# Patient Record
Sex: Female | Born: 1940 | Race: Black or African American | Hispanic: No
Health system: Midwestern US, Community
[De-identification: ages and names within clinical notes are randomized; demographics above are authoritative.]

## PROBLEM LIST (undated history)

## (undated) DIAGNOSIS — M25552 Pain in left hip: Secondary | ICD-10-CM

## (undated) DIAGNOSIS — R1011 Right upper quadrant pain: Secondary | ICD-10-CM

## (undated) DIAGNOSIS — M1612 Unilateral primary osteoarthritis, left hip: Secondary | ICD-10-CM

## (undated) DIAGNOSIS — R9431 Abnormal electrocardiogram [ECG] [EKG]: Secondary | ICD-10-CM

## (undated) DIAGNOSIS — R1010 Upper abdominal pain, unspecified: Secondary | ICD-10-CM

## (undated) DIAGNOSIS — I4891 Unspecified atrial fibrillation: Principal | ICD-10-CM

---

## 2013-03-13 NOTE — Patient Instructions (Signed)
Learning About Colonoscopy  What is a colonoscopy?  A colonoscopy is a test (also called a procedure) that lets a doctor look inside your large intestine. The doctor uses a thin, lighted tube called a colonoscope. The doctor uses it to look for small growths called polyps, colon or rectal cancer (colorectal cancer), or other problems like bleeding.  During the procedure, the doctor can take samples of tissue. The samples can then be checked for cancer or other conditions. The doctor can also take out polyps.  How is colonoscopy done?  This procedure is done in a doctor's office or a clinic or hospital. You will get medicine to help you relax and not feel pain. Some people find that they do not remember having the test because of the medicine.  The doctor gently moves the colonoscope, or scope, through the colon. The scope is also a small video camera. It lets the doctor see the colon and take pictures.  A colonoscopy usually takes 30 to 45 minutes. It may take longer if the doctor has to remove polyps.  How do you prepare for the procedure?  You need to clean out your colon before the procedure so the doctor can see all of your colon. You may start the cleaning process a day or two before the test. This depends on which "colon prep" your doctor recommends.  To clean your colon, you stop eating solid foods and drink only clear liquids. You can have water, tea, coffee, clear juices, clear broths, flavored ice pops, and gelatin (such as Jell-O). Do not drink anything red or purple, such as grape juice or fruit punch. And do not eat red or purple foods, such as grape ice pops or cherry gelatin.  The day or night before the procedure, you drink a large amount of a special liquid. This causes loose, frequent stools. You will go to the bathroom a lot. It is very important to drink all of the colon prep liquid. If you have problems drinking the liquid, call your doctor.  For many people, the prep is worse than the test.  It may be uncomfortable, and you may feel hungry on the clear liquid diet. Some people do not go to work or do their usual activities on the day of the prep.  Arrange to have someone take you home after the test.  What can you expect after a colonoscopy?  The nurses will watch you for 1 to 2 hours until the medicines wear off. Then you can go home. You will need a ride. Your doctor will tell you when you can eat and do your usual activities.  Your doctor will talk to you about when you will need your next colonoscopy. The results of your test and your risk for colorectal cancer will help your doctor decide how often you need to be checked.  Follow-up care is a key part of your treatment and safety. Be sure to make and go to all appointments, and call your doctor if you are having problems. It's also a good idea to know your test results and keep a list of the medicines you take.   Where can you learn more?   Go to http://www.healthwise.net/[facility]  Enter Z368 in the search box to learn more about "Learning About Colonoscopy."   ?? 2006-2014 Healthwise, Incorporated. Care instructions adapted under license by [employer] (which disclaims liability or warranty for this information). This care instruction is for use with your licensed healthcare professional. If you   have questions about a medical condition or this instruction, always ask your healthcare professional. Healthwise, Incorporated disclaims any warranty or liability for your use of this information.  Content Version: 10.0.270728; Last Revised: January 31, 2012

## 2013-03-13 NOTE — Progress Notes (Signed)
HISTORY OF PRESENT ILLNESS  Michele Mejia is a 72 y.o. female.  HPI  Recent hosp with rectal bleeding, [name] witness, recent hip replacement    Review of Systems   Constitutional: Negative for weight loss.   HENT: Negative for congestion and sore throat.    Eyes: Negative for double vision.   Respiratory: Negative for hemoptysis and shortness of breath.    Cardiovascular: Negative for chest pain and palpitations.   Gastrointestinal: Positive for abdominal pain. Negative for heartburn and diarrhea.   Genitourinary: Negative for hematuria.   Neurological: Negative for focal weakness.   Endo/Heme/Allergies: Does not bruise/bleed easily.   Psychiatric/Behavioral: Negative for memory loss.       Physical Exam   Constitutional: She appears well-developed and well-nourished.   obese   HENT:   Head: Normocephalic and atraumatic.   Eyes: EOM are normal.   Neck: Neck supple. No tracheal deviation present. No thyromegaly present.   Cardiovascular: Regular rhythm and normal heart sounds.    Pulmonary/Chest: Breath sounds normal. No respiratory distress.   Abdominal: Bowel sounds are normal. She exhibits no distension. There is no guarding.   Lymphadenopathy:     She has no cervical adenopathy.   Neurological: She is alert.   Skin: Skin is warm and dry. No erythema.   Psychiatric: Her behavior is normal.     Past Medical History   Diagnosis Date   ??? Hypertension    ??? Anemia    ??? Rectal bleed    ??? Gout    ??? GERD (gastroesophageal reflux disease)    ??? Asthma      Past Surgical History   Procedure Laterality Date   ??? Hx hysterectomy     ??? Hx breast reduction     ??? Hx hip replacement Right    ??? Hx breast biopsy Bilateral      neg        Current Outpatient Prescriptions   Medication Sig Dispense Refill   ??? DILTIAZEM HCL (CARTIA XT PO) Take  by mouth.       ??? LOSARTAN PO Take  by mouth.       ??? potassium chloride SA (KLOR-CON M15) 15 mEq tablet Take  by mouth two (2) times a day.       ??? FLUTICASONE/SALMETEROL (ADVAIR DISKUS IN)  Take  by inhalation.       ??? ATENOLOL PO Take  by mouth.       ??? RANITIDINE HCL (ZANTAC PO) Take  by mouth.       ??? POLYETHYLENE GLYCOL by Does Not Apply route.       ??? CLOPIDOGREL BISULFATE (PLAVIX PO) Take  by mouth.       ??? aspirin (ASPIRIN) 325 mg tablet Take 325 mg by mouth daily.       ??? ALLOPURINOL PO Take  by mouth.       ??? PRAVASTATIN SODIUM (PRAVASTATIN PO) Take  by mouth.       ??? ESCITALOPRAM OXALATE (LEXAPRO PO) Take  by mouth.       ??? HYDROMORPHONE HCL (DILAUDID) by Does Not Apply route.         Allergies   Allergen Reactions   ??? Nuts (Tree Nut) Swelling   ??? Peanut Swelling   ??? Simvastatin Unable to Obtain     History     Social History   ??? Marital Status: WIDOWED     Spouse Name: N/A     Number of Children:  N/A   ??? Years of Education: N/A     Occupational History   ??? Not on file.     Social History Main Topics   ??? Smoking status: Never Smoker    ??? Smokeless tobacco: Not on file   ??? Alcohol Use: No   ??? Drug Use: Not on file   ??? Sexually Active: Not on file     Other Topics Concern   ??? Not on file     Social History Narrative   ??? No narrative on file     No family history on file.    ASSESSMENT and PLAN  egd colonoscopy antibiotics

## 2013-04-06 NOTE — Progress Notes (Signed)
Medications reviewed/approved by Dr. Hughes.

## 2013-04-06 NOTE — Progress Notes (Signed)
HISTORY OF PRESENT ILLNESS  Michele Mejia is a 72 y.o. female.    HPI  Referred for abnormal ECG.  Due to have endoscopy and colonoscopy.  Has some DOE.  Currently no chest pain, dyspnea at rest, orthopnea, PND, palpitations, edema, syncope, or claudication.    Review of Systems   Constitutional: Positive for malaise/fatigue. Negative for weight loss.   Eyes: Negative for double vision.   Respiratory: Negative for cough, sputum production and shortness of breath.    Gastrointestinal: Positive for abdominal pain. Negative for heartburn, nausea, blood in stool and melena.   Genitourinary: Negative for dysuria.   Neurological: Negative for loss of consciousness and headaches.   All other systems reviewed and are negative.      Past Medical History   Diagnosis Date   ??? Anemia    ??? Rectal bleed    ??? Gout    ??? GERD (gastroesophageal reflux disease)    ??? Asthma    ??? HCVD (hypertensive cardiovascular disease)    ??? Obesity    ??? OSA (obstructive sleep apnea)    ??? COPD (chronic obstructive pulmonary disease)    ??? Depression    ??? TIA (transient ischemic attack)      Past Surgical History   Procedure Laterality Date   ??? Hx hysterectomy     ??? Hx breast reduction     ??? Hx hip replacement Right    ??? Hx breast biopsy Bilateral      neg     Current Outpatient Prescriptions   Medication Sig   ??? losartan-hydrochlorothiazide (HYZAAR) 100-12.5 mg per tablet Take 1 Tab by mouth daily.   ??? potassium chloride (KLOR-CON M20) 20 mEq tablet Take  by mouth daily.   ??? albuterol (VENTOLIN HFA) 90 mcg/actuation inhaler Take 2 Puffs by inhalation two (2) times a day.   ??? diclofenac EC (VOLTAREN) 75 mg EC tablet Take  by mouth two (2) times a day.   ??? DILTIAZEM HCL (CARTIA XT PO) Take 240 mg by mouth.   ??? FLUTICASONE/SALMETEROL (ADVAIR DISKUS IN) Take  by inhalation two (2) times a day.   ??? ATENOLOL PO Take 50 mg by mouth.   ??? RANITIDINE HCL (ZANTAC PO) Take 150 mg by mouth.   ??? POLYETHYLENE GLYCOL Take  by mouth.   ??? ALLOPURINOL PO Take 100  mg by mouth two (2) times a day.   ??? PRAVASTATIN SODIUM (PRAVASTATIN PO) Take 40 mg by mouth.   ??? ESCITALOPRAM OXALATE (LEXAPRO PO) Take 20 mg by mouth.     No current facility-administered medications for this visit.     History   Substance Use Topics   ??? Smoking status: Never Smoker    ??? Smokeless tobacco: Not on file   ??? Alcohol Use: No     Family History   Problem Relation Age of Onset   ??? Hypertension Mother    ??? Stroke Mother    ??? Coronary Artery Disease Sister    ??? Hypertension Sister    ??? Diabetes Sister    ??? Hypertension Brother    ??? Diabetes Brother    ??? Coronary Artery Disease Brother    ??? Hypertension Brother    ??? Diabetes Brother    ??? Diabetes Sister      BP 130/88   Pulse 78   Resp 20   Ht 5\' 7"  (1.702 m)   Wt 258 lb (117.028 kg)   BMI 40.4 kg/m2   SpO2 98%  Physical Exam   Constitutional: She appears well-developed.   HENT:   Head: Normocephalic.   Eyes: EOM are normal.   Neck: No JVD present. Carotid bruit is not present. No thyromegaly present.   Cardiovascular: Regular rhythm, S1 normal, S2 normal and intact distal pulses.  PMI is not displaced.  Exam reveals no gallop.    No murmur heard.  Pulmonary/Chest: Breath sounds normal. She has no wheezes. She has no rales.   Abdominal: She exhibits no abdominal bruit and no pulsatile midline mass. There is no splenomegaly or hepatomegaly. There is no tenderness.   Lymphadenopathy:     She has no cervical adenopathy.   Neurological: She has normal reflexes.   No edema.    ECG:  NSR, PRWP, NST    TC 172, LDL 106, HDL 44, TG's 111    ASSESSMENT and PLAN    ICD-9-CM   1. Abnormal ECG 794.31   2. HCVD (hypertensive cardiovascular disease) 402.90   3. OSA (obstructive sleep apnea) 327.23   4. Diabetes 250.00       ECG questioned old anterior MI but the findings are really non-specific.  No specific cardiac symptoms.  Hemodynamically compensated.  She had a stress test and a cath in the past.  She does not think there were any blockages.  Will try to get  records.  Favor screening with a perfusion study now.    Phineas Semen, MD, [facility]

## 2013-04-08 NOTE — Progress Notes (Addendum)
Pt is scheduled for a lexiscan stress test at [facility] on 04/15/13, 9:45am arrival.  Pt given order and instructions verbally/written.  Pt verbalized understanding.    Per [employer]- Under clinical review.  Clinicals faxed to [phone].  Ref# [id number]    AUTH# [id number]    Office note, ekg, order faxed to radiology.

## 2013-04-15 NOTE — Telephone Encounter (Signed)
Pt is returning your call.

## 2013-04-16 NOTE — Telephone Encounter (Signed)
Called about results of nuclear stress test.  Relatively minimal suggestion of an ischemic area.  With history of normal cath 4 years ago there is a significant risk this is a false positive finding.  Option is cardiac cath although that is at increased risk with history of prior TIA.  Discussed at length.  She is going to think about options for a few days and let us know.  This small area in any event would not represent a significant risk for endoscopy.    Phineas Semen, MD

## 2013-04-21 NOTE — Telephone Encounter (Signed)
Noted,  dh

## 2013-04-21 NOTE — Telephone Encounter (Signed)
Please give pt a call

## 2013-04-21 NOTE — Telephone Encounter (Signed)
Pt is calling can you please give her a call  [phone]

## 2013-04-21 NOTE — Telephone Encounter (Signed)
Returned pts call. Verified patient with two patient identifiers.    Pt states that she has been giving the cardiac catheterization thought.  She wishes to hold off on the procedure now.  Dr. Kizzie Bane will be informed.      Pt is to follow up in Oct 2014.

## 2013-04-22 NOTE — Telephone Encounter (Signed)
Returned pts call. Verified patient with two patient identifiers.    Pt states that when she was in the hospital last time her atenolol was cut back because of low heart rate and blood pressure.  Pt wanting to know if she should still be on the medication.  Informed pt that based on her last visit her BP/HR was in good range.  Pt denies any symptoms of low hr/bp.  Informed pt that she was suffering from rectal bleeding when she was in the hospital last which could contribute to the low bp.    Pt is not sure what her BP/HR is currently and does not have a device to check.      Pt has an upcoming appt with Dr. Ace Gins and she will call me with her BP/HR readings then.

## 2013-05-07 ENCOUNTER — Encounter

## 2013-06-16 NOTE — Progress Notes (Signed)
Angiodysplasia ascending colon  Chronic gastritis

## 2013-06-16 NOTE — Patient Instructions (Signed)
Learning About Diverticulosis and Diverticulitis  What are diverticulosis and diverticulitis?  In diverticulosis and diverticulitis, pouches called diverticula form in the wall of the large intestine, or colon.  ?? In diverticulosis, the pouches do not cause any pain or other symptoms.  ?? In diverticulitis, the pouches get inflamed or infected and cause symptoms.  Doctors aren't sure what causes these pouches in the colon. But they think that a low-fiber diet may play a role. Without fiber to add bulk to the stool, the colon has to work harder than normal to push the stool forward. The pressure from this may cause pouches to form in weak spots along the colon.  Some people with diverticulosis get diverticulitis. But experts don't know why this happens.  What are the symptoms?  ?? In diverticulosis, most people don't have symptoms. But pouches sometimes bleed.  ?? In diverticulitis, symptoms may last from a few hours to a week or more. They include:  ?? Belly pain. This is usually in the lower left side. It is sometimes worse when you move. This is the most common symptom.  ?? Fever and chills.  ?? Bloating and gas.  ?? Diarrhea or constipation.  ?? Nausea and sometimes vomiting.  ?? Not feeling like eating.  How can you prevent these problems?  You may be able to lower your chance of getting diverticulitis. You can do this by taking steps to prevent constipation.  ?? Eat fruits, vegetables, beans, and whole grains every day. These foods are high in fiber.  ?? Drink plenty of fluids (enough so that your urine is light yellow or clear like water). If you have kidney, heart, or liver disease and have to limit fluids, talk with your doctor before you increase the amount of fluids you drink.  ?? Get at least 30 minutes of exercise on most days of the week. Walking is a good choice. You also may want to do other activities, such as running, swimming, cycling, or playing tennis or team sports.  ?? Take a fiber supplement, such as  Citrucel or Metamucil, every day.  ?? Schedule time each day for a bowel movement. Having a daily routine may help. Take your time and do not strain when having a bowel movement.    How are these problems treated?  ?? The best way to treat diverticulosis is to avoid constipation. (See the tips above.)  ?? Treatment for diverticulitis includes antibiotics and often a change in your diet. You may need only liquids at first. Your doctor may suggest pain medicines for pain or belly cramps. In some cases, surgery may be needed.  Follow-up care is a key part of your treatment and safety. Be sure to make and go to all appointments, and call your doctor if you are having problems. It's also a good idea to know your test results and keep a list of the medicines you take.   Where can you learn more?   Go to [email or web address]  Enter [phone] in the search box to learn more about "Learning About Diverticulosis and Diverticulitis."   ?? 2006-2014 Healthwise, Incorporated. Care instructions adapted under license by [employer] (which disclaims liability or warranty for this information). This care instruction is for use with your licensed healthcare professional. If you have questions about a medical condition or this instruction, always ask your healthcare professional. Healthwise, Incorporated disclaims any warranty or liability for your use of this information.  Content Version: 10.0.270728; Last Revised: January 31, 2012

## 2013-10-07 NOTE — Progress Notes (Signed)
HISTORY OF PRESENT ILLNESS  Michele Mejia is a 72 y.o. female.    HPI  Some chronic DOE.  Currently no chest pain, dyspnea at rest, orthopnea, PND, palpitations, edema, syncope, or claudication.    Review of Systems   Respiratory: Negative for cough, sputum production and shortness of breath.    Gastrointestinal: Negative for heartburn, nausea, blood in stool and melena.   Genitourinary: Negative for dysuria.     Past Medical History   Diagnosis Date   ??? Anemia    ??? Rectal bleed    ??? Gout    ??? GERD (gastroesophageal reflux disease)    ??? Asthma    ??? HCVD (hypertensive cardiovascular disease)    ??? Obesity    ??? OSA (obstructive sleep apnea)    ??? COPD (chronic obstructive pulmonary disease)    ??? Depression    ??? TIA (transient ischemic attack)      Past Surgical History   Procedure Laterality Date   ??? Hx hysterectomy     ??? Hx breast reduction     ??? Hx hip replacement Right    ??? Hx breast biopsy Bilateral      neg   ??? Hx heart catheterization  09/2009     EF 50%, Normal Coronaries   ??? Hx colonoscopy  6.2014   ??? Hx endoscopy  6.2014     chronic gastritis     Current Outpatient Prescriptions   Medication Sig   ??? clopidogrel (PLAVIX) 75 mg tablet Take  by mouth daily.   ??? aspirin (ASPIRIN) 325 mg tablet Take 325 mg by mouth daily.   ??? losartan-hydrochlorothiazide (HYZAAR) 100-12.5 mg per tablet Take 1 Tab by mouth daily.   ??? potassium chloride (KLOR-CON M20) 20 mEq tablet Take  by mouth daily.   ??? albuterol (VENTOLIN HFA) 90 mcg/actuation inhaler Take 2 Puffs by inhalation two (2) times a day.   ??? diclofenac EC (VOLTAREN) 75 mg EC tablet Take  by mouth two (2) times a day.   ??? DILTIAZEM HCL (CARTIA XT PO) Take 240 mg by mouth.   ??? FLUTICASONE/SALMETEROL (ADVAIR DISKUS IN) Take  by inhalation two (2) times a day.   ??? ATENOLOL PO Take 50 mg by mouth.   ??? RANITIDINE HCL (ZANTAC PO) Take 150 mg by mouth.   ??? ALLOPURINOL PO Take 100 mg by mouth two (2) times a day.   ??? PRAVASTATIN SODIUM (PRAVASTATIN PO) Take 40 mg by  mouth.   ??? ESCITALOPRAM OXALATE (LEXAPRO PO) Take 20 mg by mouth.     No current facility-administered medications for this visit.     BP 126/96   Pulse 70   Resp 20   Ht 5\' 7"  (1.702 m)   Wt 261 lb (118.389 kg)   BMI 40.87 kg/m2   SpO2 98%    Physical Exam   Neck: No JVD present. Carotid bruit is not present.   Cardiovascular: Regular rhythm, S1 normal, S2 normal and intact distal pulses.  PMI is not displaced.  Exam reveals no gallop.    No murmur heard.  Pulmonary/Chest: Breath sounds normal. She has no wheezes. She has no rales.   No edema.    CARDIAC TESTING    Dipyridamole MPI 2009: partially reversible anterior defect, EF 46%    Lexiscan MPI 03/2013:  Partially reversible inferior defect, EF 40%    ASSESSMENT and PLAN    ICD-9-CM   1. HCVD (hypertensive cardiovascular disease), without heart failure 402.90  2. COPD (chronic obstructive pulmonary disease) 496   3. OSA (obstructive sleep apnea) 327.23   4. Diabetes 250.00       No specific cardiac symptoms.   Hemodynamically compensated.  Same Rx.    Follow-up Disposition:  Return in about 6 months (around 04/07/2014).    Phineas Semen, MD, [facility]

## 2013-10-07 NOTE — Progress Notes (Signed)
Medications reviewed/approved by Dr. Hughes.

## 2014-08-05 LAB — AMB EXT CREATININE: Creatinine, External: 1.11

## 2014-08-05 LAB — AMB EXT LDL-C: LDL-C, External: 99

## 2014-08-18 NOTE — Progress Notes (Signed)
Michele Mejia is a 73 y.o. female is here for routine f/u.  The patient denies chest pain/ shortness of breath, orthopnea, PND, LE edema, palpitations, syncope, presyncope or fatigue.       Patient Active Problem List    Diagnosis Date Noted   ??? GERD (gastroesophageal reflux disease) 08/18/2014   ??? Obesity 08/18/2014   ??? Dyslipidemia 08/18/2014   ??? Anemia 08/18/2014   ??? TIA (transient ischemic attack) 08/18/2014   ??? 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 HCVD (hypertensive cardiovascular disease)    ??? OSA (obstructive sleep apnea)    ??? COPD (chronic obstructive pulmonary disease) (HCC)    ??? Diabetes (HCC) 03/13/2013      [name], MD  Past Medical History   Diagnosis Date   ??? Anemia    ??? Rectal bleed    ??? Gout    ??? GERD (gastroesophageal reflux disease)    ??? Asthma    ??? HCVD (hypertensive cardiovascular disease)    ??? Obesity    ??? OSA (obstructive sleep apnea)    ??? COPD (chronic obstructive pulmonary disease) (HCC)    ??? Depression    ??? TIA (transient ischemic attack)       Past Surgical History   Procedure Laterality Date   ??? Hx hysterectomy     ??? Hx breast reduction     ??? Hx hip replacement Right    ??? Hx breast biopsy Bilateral      neg   ??? Hx heart catheterization  09/2009     EF 50%, Normal Coronaries   ??? Hx colonoscopy  6.2014   ??? Hx endoscopy  6.2014     chronic gastritis     Allergies   Allergen Reactions   ??? Nuts [Tree Nut] Swelling   ??? Peanut Swelling   ??? Simvastatin Unable to Obtain      Family History   Problem Relation Age of Onset   ??? Hypertension Mother    ??? Stroke Mother    ??? Coronary Artery Disease Sister    ??? Hypertension Sister    ??? Diabetes Sister    ??? Hypertension Brother    ??? Diabetes Brother    ??? Coronary Artery Disease Brother    ??? Hypertension Brother    ??? Diabetes Brother    ??? Diabetes Sister       History     Social History   ??? Marital Status: WIDOWED     Spouse Name: N/A     Number of Children: N/A   ??? Years of Education: N/A     Occupational History    ??? Not on file.     Social History Main Topics   ??? Smoking status: Never Smoker    ??? Smokeless tobacco: Not on file   ??? Alcohol Use: No   ??? Drug Use: Not on file   ??? Sexual Activity: Not on file     Other Topics Concern   ??? Not on file     Social History Narrative   ??? No narrative on file      Current Outpatient Prescriptions   Medication Sig   ??? clopidogrel (PLAVIX) 75 mg tablet Take  by mouth daily.   ??? aspirin (ASPIRIN) 325 mg tablet Take 325 mg by mouth daily.   ??? losartan-hydrochlorothiazide (HYZAAR) 100-12.5 mg per tablet Take 1 Tab by mouth daily.   ??? potassium chloride (  KLOR-CON M20) 20 mEq tablet Take  by mouth daily.   ??? albuterol (VENTOLIN HFA) 90 mcg/actuation inhaler Take 2 Puffs by inhalation two (2) times a day.   ??? diclofenac EC (VOLTAREN) 75 mg EC tablet Take  by mouth two (2) times a day.   ??? DILTIAZEM HCL (CARTIA XT PO) Take 240 mg by mouth.   ??? FLUTICASONE/SALMETEROL (ADVAIR DISKUS IN) Take  by inhalation two (2) times a day.   ??? ATENOLOL PO Take 50 mg by mouth.   ??? RANITIDINE HCL (ZANTAC PO) Take 150 mg by mouth.   ??? ALLOPURINOL PO Take 100 mg by mouth two (2) times a day.   ??? PRAVASTATIN SODIUM (PRAVASTATIN PO) Take 40 mg by mouth.   ??? ESCITALOPRAM OXALATE (LEXAPRO PO) Take 20 mg by mouth.     No current facility-administered medications for this visit.         Review of Symptoms:    CONST  No weight change. No fever, chills, sweats    ENT No visual changes, URI sx, sore throat    CV  See HPI   RESP  No cough, or sputum, wheezing. Also see HPI   GI  No abdominal pain or change in bowel habits.  No heartburn or dysphagia.   No melena or rectal bleeding.    GU  No dysuria, urgency, frequency, hematuria   MSKEL  No joint pain, swelling.   No muscle pain.    SKIN  No rash or lesions.    NEURO  No headache, syncope, or seizure.   No weakness, loss of sensation, or paresthesias.    PSYCH  No low mood or depression  No anxiety.    HE/LYMPH  No easy bruising, abnormal bleeding, or enlarged glands.         Physical ExamPhysical Exam: ??    BP 130/86 mmHg   Pulse 64   Resp 16   Ht  (1.702 m)   Wt 258 lb (117.028 kg)   BMI 40.40 kg/m2   SpO2 96%    Gen: NAD  HEENT:  PERRL, throat clear  Neck: no mass or thyromegaly, no JVD   Heart: ??Regular,Nl S1S2,  no murmur, gallop or rub.??  Lungs:  clear  Abdomen:?? ??Soft, non-tender, bowel sounds are active.??  Extremities:  mild edema  Pulse: symmetric  Neuro: A&O times 3, WNL      Cardiographics    ECG: unchanged from previous tracings, normal sinus rhythm, old AMI, non-specific ST T changes    CARDIAC TESTING    Dipyridamole MPI 2009: partially reversible anterior defect, EF 46%    Lexiscan MPI 03/2013:  Partially reversible inferior defect, EF 40%        Assessment:         Patient Active Problem List    Diagnosis Date Noted   ??? GERD (gastroesophageal reflux disease) 08/18/2014   ??? Obesity 08/18/2014   ??? Dyslipidemia 08/18/2014   ??? Anemia 08/18/2014   ??? TIA (transient ischemic attack) 08/18/2014   ??? 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 HCVD (hypertensive cardiovascular disease)    ??? OSA (obstructive sleep apnea)    ??? COPD (chronic obstructive pulmonary disease) (HCC)    ??? Diabetes (HCC) 03/13/2013        Plan:     Doing well with no adverse cardiac symptoms.  Lipids and labs followed by PCP.  Continue current care and f/u in 6 months.    Ellwood Sayers,  MD

## 2014-08-18 NOTE — Progress Notes (Signed)
Medications reviewed/approved by Dr. Hawkins.

## 2014-12-06 ENCOUNTER — Ambulatory Visit: Admit: 2014-12-06 | Discharge: 2014-12-06 | Payer: MEDICARE | Attending: Surgery | Primary: Family

## 2014-12-06 DIAGNOSIS — R1010 Upper abdominal pain, unspecified: Secondary | ICD-10-CM

## 2014-12-06 NOTE — Progress Notes (Signed)
Pt scheduled for CT Abd/Pelvis with oral and IV Contrast on 12/07/14 @ [facility]-- arrive @ 10:30am to get STAT BUN/CREATININE

## 2014-12-06 NOTE — Progress Notes (Signed)
HISTORY OF PRESENT ILLNESS  Michele Mejia is a 73 y.o. female.   [facility] for evaluation of abdominal pain.  HPI this patient has had epigastric abdominal pain for many years. About 2 months ago began to get worse. She sought evaluation with her primary care provider and wound believes they are able to feel an epigastric ventral hernia.    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 A couple of weeks ago she had an episode of constipation and took a laxative and in this that resolved she had some passage of mucousy blood with her stool.    She's had some weight loss and recent months that she attributes to  changes in her dietary habits.   Past Medical History   Diagnosis Date   ??? Anemia    ??? Rectal bleed    ??? Gout    ??? GERD (gastroesophageal reflux disease)    ??? Asthma    ??? HCVD (hypertensive cardiovascular disease)    ??? Obesity    ??? OSA (obstructive sleep apnea)    ??? COPD (chronic obstructive pulmonary disease) (HCC)    ??? Depression    ??? TIA (transient ischemic attack)    ??? Glaucoma    ??? DJD (degenerative joint disease)    ??? Ringing in ear    ??? Change in bowel habits        Past Surgical History   Procedure Laterality Date   ??? Hx hysterectomy     ??? Hx breast reduction     ??? Hx hip replacement Right    ??? Hx breast biopsy Bilateral      neg   ??? Hx heart catheterization  09/2009     EF 50%, Normal Coronaries   ??? Hx colonoscopy  6.2014   ??? Hx endoscopy  6.2014     chronic gastritis   ??? Hx cataract removal           Current outpatient prescriptions:   ???  clopidogrel (PLAVIX) 75 mg tablet, Take  by mouth daily., Disp: , Rfl:   ???  aspirin (ASPIRIN) 325 mg tablet, Take 325 mg by mouth daily., Disp: , Rfl:   ???  losartan-hydrochlorothiazide (HYZAAR) 100-12.5 mg per tablet, Take 1 Tab by mouth daily., Disp: , Rfl:   ???  potassium chloride (KLOR-CON M20) 20 mEq tablet, Take  by mouth daily., Disp: , Rfl:    ???  albuterol (VENTOLIN HFA) 90 mcg/actuation inhaler, Take 2 Puffs by inhalation two (2) times a day., Disp: , Rfl:   ???  diclofenac EC (VOLTAREN) 75 mg EC tablet, Take  by mouth two (2) times a day., Disp: , Rfl:   ???  DILTIAZEM HCL (CARTIA XT PO), Take 240 mg by mouth., Disp: , Rfl:   ???  FLUTICASONE/SALMETEROL (ADVAIR DISKUS IN), Take  by inhalation two (2) times a day., Disp: , Rfl:   ???  ATENOLOL PO, Take 50 mg by mouth., Disp: , Rfl:   ???  RANITIDINE HCL (ZANTAC PO), Take 150 mg by mouth., Disp: , Rfl:   ???  ALLOPURINOL PO, Take 100 mg by mouth two (2) times a day., Disp: , Rfl:   ???  PRAVASTATIN SODIUM (PRAVASTATIN PO), Take 40 mg by mouth., Disp: , Rfl:   ???  ESCITALOPRAM OXALATE (LEXAPRO PO), Take 20 mg by mouth., Disp: , Rfl:     Nuts; Peanut; and Simvastatin  family history includes Cancer in her brother and brother; Coronary Artery Disease in her brother and sister; Diabetes in her brother, brother, sister, and sister; Hypertension in her brother, brother, mother, and sister; Stroke in her mother.    History     Social History   ??? Marital Status: WIDOWED     Spouse Name: N/A     Number of Children: N/A   ??? Years of Education: N/A     Occupational History   ??? Not on file.     Social History Main Topics   ??? Smoking status: Never Smoker    ??? Smokeless tobacco: Never Used   ??? Alcohol Use: No   ??? Drug Use: Not on file   ??? Sexual Activity: Not on file     Other Topics Concern   ??? Not on file     Social History Narrative           Review of Systems   Constitutional: Positive for weight loss. Negative for fever, chills, malaise/fatigue and diaphoresis.   HENT: Negative.    Eyes: Negative.    Respiratory: Negative for cough, hemoptysis, sputum production, shortness of breath and wheezing.    Cardiovascular: Positive for palpitations. Negative for chest pain, orthopnea, claudication, leg swelling and PND.   Gastrointestinal: Positive for abdominal pain, diarrhea and constipation.  Negative for heartburn, nausea, vomiting, blood in stool and melena.   Genitourinary: Negative.    Musculoskeletal: Positive for back pain and joint pain. Negative for myalgias, falls and neck pain.   Skin: Negative.    Neurological: Negative.  Negative for weakness.   Endo/Heme/Allergies: Negative for environmental allergies and polydipsia. Bruises/bleeds easily.        On asa and plavix    Psychiatric/Behavioral: Negative.        Physical Exam   Constitutional: She is oriented to person, place, and time. She appears well-developed and well-nourished. No distress.   HENT:   Head: Normocephalic and atraumatic.   Right Ear: External ear normal.   Left Ear: External ear normal.   Nose: Nose normal.   Mouth/Throat: Oropharynx is clear and moist. No oropharyngeal exudate.   Eyes: Conjunctivae and EOM are normal. Pupils are equal, round, and reactive to light. Right eye exhibits no discharge. Left eye exhibits no discharge. No scleral icterus.   Neck: Normal range of motion. Neck supple. No JVD present. No tracheal deviation present. No thyromegaly present.   Cardiovascular: Normal rate and intact distal pulses.  Exam reveals no gallop and no friction rub.    No murmur heard.  Pulmonary/Chest: Effort normal and breath sounds normal. No stridor. No respiratory distress. She has no wheezes. She has no rales. She exhibits no tenderness.   Abdominal: Soft. Bowel sounds are normal. She exhibits mass. She exhibits no distension. There is tenderness. There is no rebound and no guarding.   Epigastric tenderness.  Diastasis? Hernia?    Musculoskeletal: She exhibits no edema or tenderness.   Lymphadenopathy:     She has no cervical adenopathy.   Neurological: She is alert and oriented to person, place, and time. She has normal reflexes. She displays normal reflexes. No cranial nerve deficit. She exhibits normal muscle tone. Coordination normal.   Skin: Skin is warm and dry. No rash noted. She is not diaphoretic. No  erythema. No pallor.   Psychiatric: She has a normal mood and affect. Her behavior is normal. Judgment and thought content normal.       ASSESSMENT and PLAN  Abdominal pain, weight loss,  epigastric mass   Will request CT of abdomen and pelvis with oral and IV contrast to evaluate for possible hernia vs. Diastasis.  Repeat evaluation to review results.   More than half of the time spent with the patient was in face to face counseling. I spent 25 minutes in this encounter with the patient.

## 2014-12-08 ENCOUNTER — Encounter

## 2014-12-13 ENCOUNTER — Ambulatory Visit: Admit: 2014-12-13 | Discharge: 2014-12-14 | Payer: MEDICARE | Attending: Surgery | Primary: Family

## 2014-12-13 DIAGNOSIS — R1013 Epigastric pain: Secondary | ICD-10-CM

## 2014-12-13 NOTE — Progress Notes (Signed)
HISTORY OF PRESENT ILLNESS  Michele Mejia is a 73 y.o. female. CT evaluation was requested to evaluate complaints of epigastric abdominal pain and possible ventral hernia.  CT shows no evidence of a hernia. Clinically she appears to have a diastases recti.  CT did come been on uncomplicated diverticulosis, kidney stones that are non-obstructing, large left renal cyst, and fatty liver.    HPI She describes epigastric abdominal pain is very much unrelenting.  Cannot describe any factors and site were relieved.  She questions if she has Crohn's disease and scribes episodes of intermittent constipation and diarrhea.  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  Past Medical History   Diagnosis Date   ??? Anemia    ??? Rectal bleed    ??? Gout    ??? GERD (gastroesophageal reflux disease)    ??? Asthma    ??? HCVD (hypertensive cardiovascular disease)    ??? Obesity    ??? OSA (obstructive sleep apnea)    ??? COPD (chronic obstructive pulmonary disease) (HCC)    ??? Depression    ??? TIA (transient ischemic attack)    ??? Glaucoma    ??? DJD (degenerative joint disease)    ??? Ringing in ear    ??? Change in bowel habits        Past Surgical History   Procedure Laterality Date   ??? Hx hysterectomy     ??? Hx breast reduction     ??? Hx hip replacement Right    ??? Hx breast biopsy Bilateral      neg   ??? Hx heart catheterization  09/2009     EF 50%, Normal Coronaries   ??? Hx colonoscopy  6.2014   ??? Hx endoscopy  6.2014     chronic gastritis   ??? Hx cataract removal           Current outpatient prescriptions:   ???  clopidogrel (PLAVIX) 75 mg tablet, Take  by mouth daily., Disp: , Rfl:   ???  aspirin (ASPIRIN) 325 mg tablet, Take 325 mg by mouth daily., Disp: , Rfl:   ???  losartan-hydrochlorothiazide (HYZAAR) 100-12.5 mg per tablet, Take 1 Tab by mouth daily., Disp: , Rfl:   ???  potassium chloride (KLOR-CON M20) 20 mEq tablet, Take  by mouth daily., Disp: , Rfl:    ???  albuterol (VENTOLIN HFA) 90 mcg/actuation inhaler, Take 2 Puffs by inhalation two (2) times a day., Disp: , Rfl:   ???  diclofenac EC (VOLTAREN) 75 mg EC tablet, Take  by mouth two (2) times a day., Disp: , Rfl:   ???  DILTIAZEM HCL (CARTIA XT PO), Take 240 mg by mouth., Disp: , Rfl:   ???  FLUTICASONE/SALMETEROL (ADVAIR DISKUS IN), Take  by inhalation two (2) times a day., Disp: , Rfl:   ???  ATENOLOL PO, Take 50 mg by mouth., Disp: , Rfl:   ???  RANITIDINE HCL (ZANTAC PO), Take 150 mg by mouth., Disp: , Rfl:   ???  ALLOPURINOL PO, Take 100 mg by mouth two (2) times a day., Disp: , Rfl:   ???  PRAVASTATIN SODIUM (PRAVASTATIN PO), Take 40 mg by mouth., Disp: , Rfl:   ???  ESCITALOPRAM OXALATE (LEXAPRO PO), Take 20 mg by mouth., Disp: , Rfl:     Nuts; Peanut; and Simvastatin    family history includes Cancer in her brother and brother;  Coronary Artery Disease in her brother and sister; Diabetes in her brother, brother, sister, and sister; Hypertension in her brother, brother, mother, and sister; Stroke in her mother.    History     Social History   ??? Marital Status: WIDOWED     Spouse Name: N/A     Number of Children: N/A   ??? Years of Education: N/A     Occupational History   ??? Not on file.     Social History Main Topics   ??? Smoking status: Never Smoker    ??? Smokeless tobacco: Never Used   ??? Alcohol Use: No   ??? Drug Use: Not on file   ??? Sexual Activity: Not on file     Other Topics Concern   ??? Not on file     Social History Narrative           Review of Systems   Constitutional: Negative.    HENT: Negative.    Eyes: Negative.    Cardiovascular: Negative.    Gastrointestinal: Positive for abdominal pain, diarrhea and constipation. Negative for heartburn, nausea, vomiting, blood in stool and melena.   Genitourinary: Negative.    Musculoskeletal: Negative.    Skin: Negative.    Neurological: Negative.    Endo/Heme/Allergies: Negative.        Physical Exam   Constitutional: She is oriented to person, place, and time. She appears  well-developed and well-nourished. No distress.   HENT:   Head: Normocephalic and atraumatic.   Right Ear: External ear normal.   Left Ear: External ear normal.   Nose: Nose normal.   Mouth/Throat: Oropharynx is clear and moist. No oropharyngeal exudate.   Eyes: Conjunctivae and EOM are normal. Pupils are equal, round, and reactive to light. Right eye exhibits no discharge. Left eye exhibits no discharge. No scleral icterus.   Neck: Normal range of motion. Neck supple. No JVD present. No tracheal deviation present. No thyromegaly present.   Cardiovascular: Normal rate, regular rhythm, normal heart sounds and intact distal pulses.  Exam reveals no gallop and no friction rub.    No murmur heard.  Pulmonary/Chest: Effort normal and breath sounds normal. Stridor present. No respiratory distress. She has no wheezes. She has no rales. She exhibits no tenderness.   Abdominal: Soft. Bowel sounds are normal. She exhibits no distension and no mass. There is no tenderness. There is no rebound and no guarding.   Musculoskeletal: Normal range of motion. She exhibits no edema or tenderness.   Lymphadenopathy:     She has no cervical adenopathy.   Neurological: She is alert and oriented to person, place, and time. She has normal reflexes. She displays normal reflexes. No cranial nerve deficit. She exhibits normal muscle tone. Coordination normal.   Skin: Skin is warm and dry. No rash noted. She is not diaphoretic. No erythema. No pallor.   Psychiatric: She has a normal mood and affect. Her behavior is normal. Judgment and thought content normal.       ASSESSMENT and PLAN  Diastases recti. Epigastric abdominal pain.  Schedule patient for upper GI endoscopy at [facility].    The expected benefits and potential risks and alternatives are discussed with the patient who demonstrates understanding and expresses\\ her  wish to have the procedure.  If this is unrevealing we'll considered for alpha left renal cyst there is  an etiology for her discomfort, and GI consultation for hepatic steatosis.  More than half of the time spent with the patient was  in face to face counseling. I spent 25 minutes in this encounter with the patient.

## 2015-01-03 ENCOUNTER — Ambulatory Visit: Admit: 2015-01-03 | Payer: MEDICARE | Attending: Surgery | Primary: Family

## 2015-01-03 DIAGNOSIS — K2990 Gastroduodenitis, unspecified, without bleeding: Secondary | ICD-10-CM

## 2015-01-03 NOTE — Addendum Note (Signed)
Addended by: Ladell PierWALKER, Alexza Norbeck R on: 01/03/2015 02:42 PM      Modules accepted: Orders

## 2015-01-03 NOTE — Progress Notes (Signed)
Pt [facility] 8:00am- arrive @ 7:30am  NPO after midnight

## 2015-01-03 NOTE — Progress Notes (Signed)
HISTORY OF PRESENT ILLNESS  Michele Mejia is a 74 y.o. female.followup after EGD. Biopsy showed have gastritis and duodenitis.  HPI she states she still having epigastric pain. Patient had right upper quadrant pain. Having ongoing issues and constipation. Had some evidence yesterday.    EGD and colonoscopy in December of 2000 410 monitor her and the laceration was identified. Zantac therapy started.  Symptoms relate to right upper quadrant abdominal pain and subscapular pain. Cannot describe any dietary inciting factors.    ROS episode of constipation blood in the stool as noted above    Physical Exam exam is unchanged from her preop  ASSESSMENT and PLAN  Gastritis and duodenitis, we'll discontinue Zantac initiate proton pump inhibitor therapy.    The results of prior colonoscopy.    We'll request ultrasound of the gallbladder the done. The evaluation here in 2 weeks. Will  More than half of the time spent with the patient was in face to face counseling. I spent 15 minutes in this encounter with the patient.

## 2015-01-03 NOTE — Addendum Note (Signed)
Addended by: Laurena SpiesWALKER, Nydia Ytuarte R on: 01/03/2015 03:38 PM      Modules accepted: Orders

## 2015-01-05 ENCOUNTER — Encounter

## 2015-01-07 ENCOUNTER — Encounter

## 2015-01-07 NOTE — Progress Notes (Signed)
Per Dr Freddi CheLovelady, pt was told that her US was unremarkable and she needs to have a HIDA scan. This was scheduled for 1.27.16 at 1:00pm [facility]. Pt was advised of appt and was told not to eat or drink after 7am that morning. She has a f/u appt on 2.3.16. Her insurance is Medicare.

## 2015-01-17 ENCOUNTER — Encounter: Attending: Surgery | Primary: Family

## 2015-01-17 ENCOUNTER — Ambulatory Visit: Admit: 2015-01-17 | Discharge: 2015-01-17 | Payer: MEDICARE | Attending: Surgery | Primary: Family

## 2015-01-17 ENCOUNTER — Encounter

## 2015-01-17 DIAGNOSIS — K8050 Calculus of bile duct without cholangitis or cholecystitis without obstruction: Secondary | ICD-10-CM

## 2015-01-17 NOTE — Progress Notes (Signed)
HISTORY OF PRESENT ILLNESS  Michele Mejia is a 74 y.o. female. On results of HIDA scan. HIDA showed diminished gallbladder ejection fraction. Patient reports reproduction of her right upper quadrant abdominal pain during ejection fraction phase of the study  HPI having intermittent episodes of right upper quadrant abdominal pain and right subscapular pain. Cannot find any specific foods but again since that the spicy or orally foods or more likely to cause  these episodes to occur  Past Medical History   Diagnosis Date   ??? Anemia    ??? Rectal bleed    ??? Gout    ??? GERD (gastroesophageal reflux disease)    ??? Asthma    ??? HCVD (hypertensive cardiovascular disease)    ??? Obesity    ??? OSA (obstructive sleep apnea)    ??? COPD (chronic obstructive pulmonary disease) (HCC)    ??? Depression    ??? TIA (transient ischemic attack)    ??? Glaucoma    ??? DJD (degenerative joint disease)    ??? Ringing in ear    ??? Change in bowel habits        Past Surgical History   Procedure Laterality Date   ??? Hx hysterectomy     ??? Hx breast reduction     ??? Hx hip replacement Right    ??? Hx breast biopsy Bilateral      neg   ??? Hx heart catheterization  09/2009     EF 50%, Normal Coronaries   ??? Hx colonoscopy  6.2014   ??? Hx endoscopy  6.2014     chronic gastritis   ??? Hx cataract removal     ??? Hx endoscopy  12/2014     gastritis         Current outpatient prescriptions:   ???  clopidogrel (PLAVIX) 75 mg tablet, Take  by mouth daily., Disp: , Rfl:   ???  aspirin (ASPIRIN) 325 mg tablet, Take 325 mg by mouth daily., Disp: , Rfl:   ???  losartan-hydrochlorothiazide (HYZAAR) 100-12.5 mg per tablet, Take 1 Tab by mouth daily., Disp: , Rfl:   ???  potassium chloride (KLOR-CON M20) 20 mEq tablet, Take  by mouth daily., Disp: , Rfl:   ???  albuterol (VENTOLIN HFA) 90 mcg/actuation inhaler, Take 2 Puffs by inhalation two (2) times a day., Disp: , Rfl:   ???  diclofenac EC (VOLTAREN) 75 mg EC tablet, Take  by mouth two (2) times a day., Disp: , Rfl:    ???  DILTIAZEM HCL (CARTIA XT PO), Take 240 mg by mouth., Disp: , Rfl:   ???  FLUTICASONE/SALMETEROL (ADVAIR DISKUS IN), Take  by inhalation two (2) times a day., Disp: , Rfl:   ???  ATENOLOL PO, Take 50 mg by mouth., Disp: , Rfl:   ???  RANITIDINE HCL (ZANTAC PO), Take 150 mg by mouth., Disp: , Rfl:   ???  ALLOPURINOL PO, Take 100 mg by mouth two (2) times a day., Disp: , Rfl:   ???  PRAVASTATIN SODIUM (PRAVASTATIN PO), Take 40 mg by mouth., Disp: , Rfl:   ???  ESCITALOPRAM OXALATE (LEXAPRO PO), Take 20 mg by mouth., Disp: , Rfl:     Nuts; Peanut; and Simvastatin    family history includes Cancer in her brother and brother; Coronary Artery Disease in her brother and sister; Diabetes in her brother, brother, sister, and sister; Hypertension in her brother, brother, mother, and sister; Stroke in her mother.    History  Social History   ??? Marital Status: WIDOWED     Spouse Name: N/A     Number of Children: N/A   ??? Years of Education: N/A     Occupational History   ??? Not on file.     Social History Main Topics   ??? Smoking status: Never Smoker    ??? Smokeless tobacco: Never Used   ??? Alcohol Use: No   ??? Drug Use: Not on file   ??? Sexual Activity: Not on file     Other Topics Concern   ??? Not on file     Social History Narrative           Review of Systems   Constitutional: Positive for chills and weight loss. Negative for fever, malaise/fatigue and diaphoresis.   HENT: Negative.    Eyes: Negative.    Respiratory: Negative.    Cardiovascular: Negative.    Gastrointestinal: Positive for heartburn, nausea and abdominal pain. Negative for vomiting, diarrhea, constipation, blood in stool and melena.   Genitourinary: Negative.    Musculoskeletal: Positive for myalgias, back pain and joint pain. Negative for falls and neck pain.   Skin: Negative.    Neurological: Negative for weakness.   Endo/Heme/Allergies: Negative for environmental allergies and polydipsia. Bruises/bleeds easily.         aspirin and Plavix    Psychiatric/Behavioral: Negative.        Physical Exam   Constitutional: She is oriented to person, place, and time. She appears well-developed and well-nourished. No distress.   HENT:   Head: Normocephalic and atraumatic.   Right Ear: External ear normal.   Left Ear: External ear normal.   Nose: Nose normal.   Mouth/Throat: Oropharynx is clear and moist. No oropharyngeal exudate.   Eyes: Conjunctivae and EOM are normal. Pupils are equal, round, and reactive to light. Right eye exhibits no discharge. Left eye exhibits no discharge. No scleral icterus.   Neck: Normal range of motion. Neck supple. No JVD present. No tracheal deviation present. No thyromegaly present.   Cardiovascular: Normal rate, regular rhythm, normal heart sounds and intact distal pulses.  Exam reveals no gallop and no friction rub.    No murmur heard.  Pulmonary/Chest: Effort normal and breath sounds normal. No stridor. No respiratory distress. She has no wheezes. She has no rales. She exhibits no tenderness.   Abdominal: Soft. Bowel sounds are normal. She exhibits no distension and no mass. There is tenderness. There is no rebound and no guarding.   Musculoskeletal: Normal range of motion. She exhibits no edema or tenderness.   Lymphadenopathy:     She has no cervical adenopathy.   Neurological: She is alert and oriented to person, place, and time. She has normal reflexes. She displays normal reflexes. No cranial nerve deficit. She exhibits normal muscle tone. Coordination normal.   Skin: Skin is warm and dry. No rash noted. She is not diaphoretic. No erythema. No pallor.   Psychiatric: She has a normal mood and affect. Her behavior is normal. Thought content normal.       ASSESSMENT and PLAN  Right upper quadrant abdominal pain, abnormal gallbladder ejection fraction.  Schedule the patient for laparoscopic vs. open cholecystectomy and indicated procedures.    The expected benefits and potential risks and alternatives are discussed  with the patient who demonstrates understanding and expresses her wish to have the procedure.  More than half of the time spent with the patient was in face to face counseling. I spent 25  minutes in this encounter with the patient.

## 2015-01-19 ENCOUNTER — Encounter: Attending: Surgery | Primary: Family

## 2015-02-01 NOTE — Addendum Note (Signed)
Addended by: Ladell PierWALKER, Graceson Nichelson R on: 02/01/2015 10:12 AM      Modules accepted: Orders

## 2015-02-02 ENCOUNTER — Ambulatory Visit: Admit: 2015-02-02 | Discharge: 2015-02-02 | Payer: MEDICARE | Attending: Surgery | Primary: Family

## 2015-02-02 DIAGNOSIS — K811 Chronic cholecystitis: Secondary | ICD-10-CM

## 2015-02-02 NOTE — Progress Notes (Signed)
HISTORY OF PRESENT ILLNESS  Michele Mejia is a 74 y.o. female. Status post laparoscopic cholecystectomy. Path all the shows chronic cholecystitis. Copy findings of some adhesions that were lysed. She had a visit to the ER on Sunday with abdominal pain a CT was done that showed a pathological changes. She subsequently had a bowel movement and felt better. There is a suggestion of some ongoing issues with her and constipation her. She started on some MiraLax and things seem to be working better there. We'll request a repeat evaluation here in a month to monitor her progress   HPIas noted above. Status post laparoscopic cholecystectomy.    ROSepisode of abdominal pain Sunday resolved after evaluation.    Physical Examsquints are healing well. No evidence of consultation the wound. Abdomen soft bowel sounds are present no focal areas of tenderness    ASSESSMENT and PLAN  Abdominal pain after cholecystectomy. Resolved. Chronic cholecystitis and scarring around the gallbladder identified at the time of surgery. Path all the confirmed chronic cholecystitis.    Recommend that  Continued use of MiraLax and increase her fluid intake. Repeat evaluation in one month to monitor that.  More than half of the time spent with the patient was in face to face counseling. I spent 15 minutes in this encounter with the patient.

## 2015-02-21 ENCOUNTER — Encounter: Attending: Specialist | Primary: Family

## 2015-03-07 ENCOUNTER — Ambulatory Visit: Admit: 2015-03-07 | Payer: MEDICARE | Attending: Surgery | Primary: Family

## 2015-03-07 DIAGNOSIS — K5909 Other constipation: Secondary | ICD-10-CM

## 2015-03-07 NOTE — Progress Notes (Signed)
HISTORY OF PRESENT ILLNESS  Michele ShowersMarcella V Mejia is a 74 y.o. female. History of chronic constipation has been on MiraLax therapy. He is also status post laparoscopic cholecystectomy.  HPI she reports having significant resolution of her pain since laparoscopic cholecystectomy. Had been using her MiraLax and reports that her bowel function is better than she still has some issues with constipation intermittently.    ROSreports diminished and absent right upper quadrant pain occasional left upper quadrant pain related to bowel activity improving with MiraLax    Physical Examexamination is unchanged.    ASSESSMENT and PLAN  Status post laparoscopic cholecystectomy satisfactory convalescence.    Chronic constipation improving with MiraLax. Recommend additional fluid in the diet. Last colonoscopy was a couple of years ago no polyps were identified. She'll follow up with us on a p.r.n. Basis.  More than half of the time spent with the patient was in face to face counseling. I spent 15 minutes in this encounter with the patient.

## 2015-04-04 ENCOUNTER — Ambulatory Visit: Admit: 2015-04-04 | Discharge: 2015-04-04 | Payer: MEDICARE | Attending: Cardiovascular Disease | Primary: Family

## 2015-04-04 DIAGNOSIS — I119 Hypertensive heart disease without heart failure: Secondary | ICD-10-CM

## 2015-04-04 NOTE — Progress Notes (Signed)
Michele Mejia is a 74 y.o. female is here for routine f/u. Stable DOE. Recently started on Singulair, more dizziness, ?vertigo, tinnitus.  S/p lap cholecystectomy in February, doing ok.  Otherwise no CV complaints. The patient denies chest pain, orthopnea, PND, LE edema, palpitations, syncope, presyncope or fatigue.       Patient Active Problem List    Diagnosis Date Noted   ??? Abdominal pain 12/06/2014   ??? Weight loss 12/06/2014   ??? Ventral hernia 12/06/2014   ??? GERD (gastroesophageal reflux disease) 08/18/2014   ??? Obesity 08/18/2014   ??? Dyslipidemia 08/18/2014   ??? Anemia 08/18/2014   ??? TIA (transient ischemic attack) 08/18/2014   ??? 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 HCVD (hypertensive cardiovascular disease)    ??? OSA (obstructive sleep apnea)    ??? COPD (chronic obstructive pulmonary disease) (HCC)    ??? Diabetes (HCC) 03/13/2013      Elane Fritz  Past Medical History   Diagnosis Date   ??? Anemia    ??? Rectal bleed    ??? Gout    ??? GERD (gastroesophageal reflux disease)    ??? Asthma    ??? HCVD (hypertensive cardiovascular disease)    ??? Obesity    ??? OSA (obstructive sleep apnea)    ??? COPD (chronic obstructive pulmonary disease) (HCC)    ??? Depression    ??? TIA (transient ischemic attack)    ??? Glaucoma    ??? DJD (degenerative joint disease)    ??? Ringing in ear    ??? Change in bowel habits       Past Surgical History   Procedure Laterality Date   ??? Hx hysterectomy     ??? Hx breast reduction     ??? Hx hip replacement Right    ??? Hx breast biopsy Bilateral      neg   ??? Hx heart catheterization  09/2009     EF 50%, Normal Coronaries   ??? Hx colonoscopy  6.2014   ??? Hx endoscopy  6.2014     chronic gastritis   ??? Hx cataract removal     ??? Hx endoscopy  12/2014     gastritis   ??? Hx cholecystectomy  2016     Allergies   Allergen Reactions   ??? Nuts [Tree Nut] Swelling   ??? Peanut Swelling   ??? Simvastatin Unable to Obtain      Family History   Problem Relation Age of Onset   ??? Hypertension Mother     ??? Stroke Mother    ??? Coronary Artery Disease Sister    ??? Hypertension Sister    ??? Diabetes Sister    ??? Hypertension Brother    ??? Diabetes Brother    ??? Coronary Artery Disease Brother    ??? Cancer Brother      lung   ??? Hypertension Brother    ??? Diabetes Brother    ??? Cancer Brother      prostate   ??? Diabetes Sister       History     Social History   ??? Marital Status: WIDOWED     Spouse Name: N/A   ??? Number of Children: N/A   ??? Years of Education: N/A     Occupational History   ??? Not on file.     Social History Main Topics   ??? Smoking status: Never Smoker    ??? Smokeless tobacco: Never Used   ??? Alcohol Use: No   ???  Drug Use: Not on file   ??? Sexual Activity: Not on file     Other Topics Concern   ??? Not on file     Social History Narrative      Current Outpatient Prescriptions   Medication Sig   ??? POLYETHYLENE GLYCOL 3350 (MIRALAX PO) Take  by mouth daily.   ??? OTHER Vitamin D 50,000 units for 8 weeks then reduce to 2,000 units daily.   ??? montelukast (SINGULAIR) 10 mg tablet Take 10 mg by mouth daily.   ??? clopidogrel (PLAVIX) 75 mg tablet Take  by mouth daily.   ??? aspirin (ASPIRIN) 325 mg tablet Take 325 mg by mouth daily.   ??? losartan-hydrochlorothiazide (HYZAAR) 100-12.5 mg per tablet Take 1 Tab by mouth daily.   ??? potassium chloride (KLOR-CON M20) 20 mEq tablet Take  by mouth daily. One daily except two on Wednesday and Friday.   ??? albuterol (VENTOLIN HFA) 90 mcg/actuation inhaler Take 2 Puffs by inhalation two (2) times a day.   ??? diclofenac EC (VOLTAREN) 75 mg EC tablet Take  by mouth two (2) times a day.   ??? DILTIAZEM HCL (CARTIA XT PO) Take 240 mg by mouth.   ??? FLUTICASONE/SALMETEROL (ADVAIR DISKUS IN) Take  by inhalation two (2) times a day.   ??? ATENOLOL PO Take 50 mg by mouth daily.   ??? RANITIDINE HCL (ZANTAC PO) Take 150 mg by mouth.   ??? ALLOPURINOL PO Take 100 mg by mouth two (2) times a day.   ??? PRAVASTATIN SODIUM (PRAVASTATIN PO) Take 40 mg by mouth daily.    ??? ESCITALOPRAM OXALATE (LEXAPRO PO) Take 20 mg by mouth daily.     No current facility-administered medications for this visit.         Review of Symptoms:    CONST  No weight change. No fever, chills, sweats    ENT No visual changes, URI sx, sore throat    CV  See HPI   RESP  No cough, or sputum, wheezing. Also see HPI   GI  No abdominal pain or change in bowel habits.  No heartburn or dysphagia.   No melena or rectal bleeding.    GU  No dysuria, urgency, frequency, hematuria   MSKEL  No joint pain, swelling.   No muscle pain.    SKIN  No rash or lesions.    NEURO  No headache, syncope, or seizure.   No weakness, loss of sensation, or paresthesias.    PSYCH  No low mood or depression  No anxiety.    HE/LYMPH  No easy bruising, abnormal bleeding, or enlarged glands.        Physical ExamPhysical Exam: ??  BP 144/90 mmHg   Pulse 71   Resp 16   Ht  (1.702 m)   Wt 246 lb (111.585 kg)   BMI 38.52 kg/m2   SpO2 99%  Gen: NAD  HEENT:  PERRL, throat clear  Neck: no mass or thyromegaly, no JVD   Heart: ??Regular,Nl S1S2,  no murmur, gallop or rub.??  Lungs:  clear  Abdomen:?? ??Soft, non-tender, bowel sounds are active.??  Extremities:  No edema  Pulse: symmetric  Neuro: A&O times 3, WNL      Cardiographics    ECG: NSR, low voltage, PRWP, old AMI, no changes    CARDIAC TESTING    Dipyridamole MPI 2009: partially reversible anterior defect, EF 46%    Lexiscan MPI 03/2013:  Partially reversible inferior defect, EF 40%  Labs:   No results found for: NA, K, CL, CO2, AGAP, GLU, BUN, CREA, BUCR, GFRAA, GFRNA, CA, TBIL, TBILI, GPT, SGOT, AP, TP, ALB, GLOB, AGRAT, ALT  No results found for: CPK, CPKX, CPX  No results found for: CHOL, CHOLX, CHLST, CHOLV, 884269, HDL, LDL, DLDL, LDLC, DLDLP, TGL, TGLX, TRIGL, [id number], TRIGP, CHHD, CHHDX  No results found for this or any previous visit.    Assessment:         Patient Active Problem List    Diagnosis Date Noted   ??? Abdominal pain 12/06/2014   ??? Weight loss 12/06/2014    ??? Ventral hernia 12/06/2014   ??? GERD (gastroesophageal reflux disease) 08/18/2014   ??? Obesity 08/18/2014   ??? Dyslipidemia 08/18/2014   ??? Anemia 08/18/2014   ??? TIA (transient ischemic attack) 08/18/2014   ??? 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 HCVD (hypertensive cardiovascular disease)    ??? OSA (obstructive sleep apnea)    ??? COPD (chronic obstructive pulmonary disease) (HCC)    ??? Diabetes (HCC) 03/13/2013      Stable DOE. Recently started on Singulair, more dizziness, ?vertigo, tinnitus.  S/p lap cholecystectomy in February, doing ok.  Otherwise no CV complaints.   Plan:     Doing well with no adverse cardiac symptoms.  Lipids and labs followed by PCP.  Continue current care and f/u in 6 months.    Ellwood SayersJOHN W Kalem Rockwell, MD

## 2015-04-04 NOTE — Progress Notes (Signed)
MEDICATION REVIEWED AND APPROVED BY DR. HAWKINS.

## 2015-10-04 ENCOUNTER — Ambulatory Visit: Admit: 2015-10-04 | Discharge: 2015-10-04 | Payer: MEDICARE | Attending: Cardiovascular Disease | Primary: Family

## 2015-10-04 DIAGNOSIS — I119 Hypertensive heart disease without heart failure: Secondary | ICD-10-CM

## 2015-10-04 NOTE — Progress Notes (Signed)
Michele Mejia is a 74 y.o. female is here for routine f/u.  No CV sx or complaints.  Recent stressors, lost family member, anxiety, etc with ER visit secondary.  Stable COPD sx. BP high at times, now ok. The patient denies chest pain/ shortness of breath, orthopnea, PND, LE edema, palpitations, syncope, presyncope or fatigue.       Patient Active Problem List    Diagnosis Date Noted   ??? Abdominal pain 12/06/2014   ??? Weight loss 12/06/2014   ??? Ventral hernia 12/06/2014   ??? GERD (gastroesophageal reflux disease) 08/18/2014   ??? Obesity 08/18/2014   ??? Dyslipidemia 08/18/2014   ??? Anemia 08/18/2014   ??? TIA (transient ischemic attack) 08/18/2014   ??? 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 HCVD (hypertensive cardiovascular disease)    ??? OSA (obstructive sleep apnea)    ??? COPD (chronic obstructive pulmonary disease) (HCC)    ??? Diabetes (HCC) 03/13/2013      Michele Franklyn Lor, NP  Past Medical History   Diagnosis Date   ??? Anemia    ??? Asthma    ??? Change in bowel habits    ??? COPD (chronic obstructive pulmonary disease) (HCC)    ??? Depression    ??? DJD (degenerative joint disease)    ??? GERD (gastroesophageal reflux disease)    ??? Glaucoma    ??? Gout    ??? HCVD (hypertensive cardiovascular disease)    ??? Obesity    ??? OSA (obstructive sleep apnea)    ??? Rectal bleed    ??? Ringing in ear    ??? TIA (transient ischemic attack)       Past Surgical History   Procedure Laterality Date   ??? Hx hysterectomy     ??? Hx breast reduction     ??? Hx hip replacement Right    ??? Hx breast biopsy Bilateral      neg   ??? Hx heart catheterization  09/2009     EF 50%, Normal Coronaries   ??? Hx colonoscopy  6.2014   ??? Hx endoscopy  6.2014     chronic gastritis   ??? Hx cataract removal     ??? Hx endoscopy  12/2014     gastritis   ??? Hx cholecystectomy  2016     Allergies   Allergen Reactions   ??? Nuts [Tree Nut] Swelling   ??? Peanut Swelling   ??? Simvastatin Unable to Obtain      Family History   Problem Relation Age of Onset    ??? Hypertension Mother    ??? Stroke Mother    ??? Coronary Artery Disease Sister    ??? Hypertension Sister    ??? Diabetes Sister    ??? Hypertension Brother    ??? Diabetes Brother    ??? Coronary Artery Disease Brother    ??? Cancer Brother      lung   ??? Hypertension Brother    ??? Diabetes Brother    ??? Cancer Brother      prostate   ??? Diabetes Sister       Social History     Social History   ??? Marital status: WIDOWED     Spouse name: N/A   ??? Number of children: N/A   ??? Years of education: N/A     Occupational History   ??? Not on file.     Social History Main Topics   ??? Smoking status: Never Smoker   ??? Smokeless tobacco:  Never Used   ??? Alcohol use No   ??? Drug use: Not on file   ??? Sexual activity: Not on file     Other Topics Concern   ??? Not on file     Social History Narrative      Current Outpatient Prescriptions   Medication Sig   ??? OTHER Vitamin D 50,000 units for 8 weeks then reduce to 2,000 units daily.   ??? clopidogrel (PLAVIX) 75 mg tablet Take  by mouth daily.   ??? aspirin (ASPIRIN) 325 mg tablet Take 325 mg by mouth daily.   ??? losartan-hydrochlorothiazide (HYZAAR) 100-12.5 mg per tablet Take 1 Tab by mouth daily.   ??? potassium chloride (KLOR-CON M20) 20 mEq tablet Take  by mouth daily. One daily except two on Wednesday and Friday.   ??? albuterol (VENTOLIN HFA) 90 mcg/actuation inhaler Take 2 Puffs by inhalation two (2) times a day.   ??? diclofenac EC (VOLTAREN) 75 mg EC tablet Take  by mouth two (2) times a day.   ??? DILTIAZEM HCL (CARTIA XT PO) Take 240 mg by mouth.   ??? FLUTICASONE/SALMETEROL (ADVAIR DISKUS IN) Take  by inhalation two (2) times a day.   ??? ATENOLOL PO Take 50 mg by mouth daily.   ??? RANITIDINE HCL (ZANTAC PO) Take 150 mg by mouth.   ??? ALLOPURINOL PO Take 100 mg by mouth two (2) times a day.   ??? PRAVASTATIN SODIUM (PRAVASTATIN PO) Take 40 mg by mouth daily.   ??? ESCITALOPRAM OXALATE (LEXAPRO PO) Take 20 mg by mouth daily.     No current facility-administered medications for this visit.           Review of Symptoms:    CONST  No weight change. No fever, chills, sweats    ENT No visual changes, URI sx, sore throat    CV  See HPI   RESP  No cough, or sputum, wheezing. Also see HPI   GI  No abdominal pain or change in bowel habits.  No heartburn or dysphagia.   No melena or rectal bleeding.    GU  No dysuria, urgency, frequency, hematuria   MSKEL  No joint pain, swelling.   No muscle pain.    SKIN  No rash or lesions.    NEURO  No headache, syncope, or seizure.   No weakness, loss of sensation, or paresthesias.    PSYCH  No low mood or depression  No anxiety.    HE/LYMPH  No easy bruising, abnormal bleeding, or enlarged glands.        Physical ExamPhysical Exam: ??  Visit Vitals   ??? BP 140/88 (BP 1 Location: Left arm, BP Patient Position: Sitting)   ??? Pulse 68   ??? Resp 16   ??? Ht 5\' 7"  (1.702 m)   ??? Wt 250 lb (113.4 kg)   ??? SpO2 98%   ??? BMI 39.16 kg/m2     Gen: NAD  HEENT:  PERRL, throat clear  Neck: no mass or thyromegaly, no JVD   Heart: ??Regular,Nl S1S2,  no murmur, gallop or rub.??  Lungs:  clear  Abdomen:?? ??Soft, non-tender, bowel sounds are active.??  Extremities:  No edema  Pulse: symmetric  Neuro: A&O times 3, WNL      Cardiographics      CARDIAC TESTING    Dipyridamole MPI 2009: partially reversible anterior defect, EF 46%    Lexiscan MPI 03/2013:  Partially reversible inferior defect, EF 40%  Labs:   No results found for: NA, K, CL, CO2, AGAP, GLU, BUN, CREA, BUCR, GFRAA, GFRNA, CA, TBIL, TBILI, GPT, SGOT, AP, TP, ALB, GLOB, AGRAT, ALT  No results found for: CPK, CPKX, CPX  No results found for: CHOL, CHOLX, CHLST, CHOLV, 884269, HDL, LDL, DLDL, LDLC, DLDLP, TGL, TGLX, TRIGL, [id number], TRIGP, CHHD, CHHDX  No results found for this or any previous visit.    Assessment:         Patient Active Problem List    Diagnosis Date Noted   ??? Abdominal pain 12/06/2014   ??? Weight loss 12/06/2014   ??? Ventral hernia 12/06/2014   ??? GERD (gastroesophageal reflux disease) 08/18/2014   ??? Obesity 08/18/2014    ??? Dyslipidemia 08/18/2014   ??? Anemia 08/18/2014   ??? TIA (transient ischemic attack) 08/18/2014   ??? 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 HCVD (hypertensive cardiovascular disease)    ??? OSA (obstructive sleep apnea)    ??? COPD (chronic obstructive pulmonary disease) (HCC)    ??? Diabetes (HCC) 03/13/2013      No CV sx or complaints.  Recent stressors, lost family member, anxiety, etc with ER visit secondary.  Stable COPD sx. BP high at times, now ok.      Plan:     Doing well with no adverse cardiac symptoms.  Lipids and labs followed by PCP.  Continue current care and f/u in 6 months.    Ellwood SayersJohn W Rocsi Hazelbaker, MD

## 2015-10-04 NOTE — Progress Notes (Signed)
Verified patient with two patient identifiers.    Medications reviewed/approved by Dr. Hawkins.    Verbal from Dr. Hawkins to remove the medications that were deleted during the visit.

## 2016-04-06 ENCOUNTER — Ambulatory Visit: Admit: 2016-04-06 | Discharge: 2016-04-06 | Payer: MEDICARE | Attending: Cardiovascular Disease | Primary: Family

## 2016-04-06 DIAGNOSIS — I119 Hypertensive heart disease without heart failure: Secondary | ICD-10-CM

## 2016-04-06 NOTE — Progress Notes (Signed)
Michele Mejia is a 75 y.o. female is here for routine f/u. No CV sx or complaints. Mild stable DOE. Lost family member last fall, sx of depression, fatigue, etc.  The patient denies chest pain, orthopnea, PND, LE edema, palpitations, syncope, presyncope.       Patient Active Problem List    Diagnosis Date Noted   ??? Abdominal pain 12/06/2014   ??? Weight loss 12/06/2014   ??? Ventral hernia 12/06/2014   ??? GERD (gastroesophageal reflux disease) 08/18/2014   ??? Obesity 08/18/2014   ??? Dyslipidemia 08/18/2014   ??? Anemia 08/18/2014   ??? TIA (transient ischemic attack) 08/18/2014   ??? 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 HCVD (hypertensive cardiovascular disease)    ??? OSA (obstructive sleep apnea)    ??? COPD (chronic obstructive pulmonary disease) (HCC)    ??? Diabetes (HCC) 03/13/2013      [name] Franklyn Lor, NP  Past Medical History:   Diagnosis Date   ??? Anemia    ??? Asthma    ??? Change in bowel habits    ??? COPD (chronic obstructive pulmonary disease) (HCC)    ??? Depression    ??? DJD (degenerative joint disease)    ??? GERD (gastroesophageal reflux disease)    ??? Glaucoma    ??? Gout    ??? HCVD (hypertensive cardiovascular disease)    ??? Obesity    ??? OSA (obstructive sleep apnea)    ??? Rectal bleed    ??? Ringing in ear    ??? TIA (transient ischemic attack)       Past Surgical History:   Procedure Laterality Date   ??? HX BREAST BIOPSY Bilateral     neg   ??? HX BREAST REDUCTION     ??? HX CATARACT REMOVAL     ??? HX CHOLECYSTECTOMY  2016   ??? HX COLONOSCOPY  6.2014   ??? HX ENDOSCOPY  6.2014    chronic gastritis   ??? HX ENDOSCOPY  12/2014    gastritis   ??? HX HEART CATHETERIZATION  09/2009    EF 50%, Normal Coronaries   ??? HX HIP REPLACEMENT Right    ??? HX HYSTERECTOMY       Allergies   Allergen Reactions   ??? Nuts [Tree Nut] Swelling   ??? Peanut Swelling   ??? Simvastatin Unable to Obtain      Family History   Problem Relation Age of Onset   ??? Hypertension Mother    ??? Stroke Mother    ??? Coronary Artery Disease Sister     ??? Hypertension Sister    ??? Diabetes Sister    ??? Hypertension Brother    ??? Diabetes Brother    ??? Coronary Artery Disease Brother    ??? Cancer Brother      lung   ??? Hypertension Brother    ??? Diabetes Brother    ??? Cancer Brother      prostate   ??? Diabetes Sister       Social History     Social History   ??? Marital status: WIDOWED     Spouse name: N/A   ??? Number of children: N/A   ??? Years of education: N/A     Occupational History   ??? Not on file.     Social History Main Topics   ??? Smoking status: Never Smoker   ??? Smokeless tobacco: Never Used   ??? Alcohol use No   ??? Drug use: Not on file   ???  Sexual activity: Not on file     Other Topics Concern   ??? Not on file     Social History Narrative      Current Outpatient Prescriptions   Medication Sig   ??? OTHER Vitamin D 50,000 units for 8 weeks then reduce to 2,000 units daily.   ??? clopidogrel (PLAVIX) 75 mg tablet Take  by mouth daily.   ??? aspirin (ASPIRIN) 325 mg tablet Take 325 mg by mouth daily.   ??? losartan-hydrochlorothiazide (HYZAAR) 100-12.5 mg per tablet Take 1 Tab by mouth daily.   ??? potassium chloride (KLOR-CON M20) 20 mEq tablet Take  by mouth daily. One daily except two on Wednesday and Friday.   ??? albuterol (VENTOLIN HFA) 90 mcg/actuation inhaler Take 2 Puffs by inhalation two (2) times a day.   ??? diclofenac EC (VOLTAREN) 75 mg EC tablet Take  by mouth two (2) times a day.   ??? DILTIAZEM HCL (CARTIA XT PO) Take 240 mg by mouth.   ??? FLUTICASONE/SALMETEROL (ADVAIR DISKUS IN) Take  by inhalation two (2) times a day.   ??? ATENOLOL PO Take 50 mg by mouth daily.   ??? RANITIDINE HCL (ZANTAC PO) Take 150 mg by mouth.   ??? ALLOPURINOL PO Take 100 mg by mouth two (2) times a day.   ??? PRAVASTATIN SODIUM (PRAVASTATIN PO) Take 40 mg by mouth daily.   ??? ESCITALOPRAM OXALATE (LEXAPRO PO) Take 20 mg by mouth daily.     No current facility-administered medications for this visit.          Review of Symptoms:    CONST  No weight change. No fever, chills, sweats     ENT No visual changes, URI sx, sore throat    CV  See HPI   RESP  No cough, or sputum, wheezing. Also see HPI   GI  No abdominal pain or change in bowel habits.  No heartburn or dysphagia.   No melena or rectal bleeding.    GU  No dysuria, urgency, frequency, hematuria   MSKEL  No joint pain, swelling.   No muscle pain.    SKIN  No rash or lesions.    NEURO  No headache, syncope, or seizure.   No weakness, loss of sensation, or paresthesias.    PSYCH  No low mood or depression  No anxiety.    HE/LYMPH  No easy bruising, abnormal bleeding, or enlarged glands.        Physical ExamPhysical Exam: ??  Visit Vitals   ??? BP 122/80 (BP 1 Location: Left arm, BP Patient Position: Sitting)   ??? Pulse 66   ??? Resp 16   ??? Ht  (1.702 m)   ??? Wt 254 lb (115.2 kg)   ??? SpO2 99%   ??? BMI 39.78 kg/m2     Gen: NAD  HEENT:  PERRL, throat clear  Neck: no mass or thyromegaly, no JVD   Heart: ??Regular,Nl S1S2,  no murmur, gallop or rub.??  Lungs:  clear  Abdomen:?? ??Soft, non-tender, bowel sounds are active.??  Extremities:  No edema  Pulse: symmetric  Neuro: A&O times 3, WNL      Cardiographics    ECG: NSR, low voltage, NST    Labs:   No results found for: NA, K, CL, CO2, AGAP, GLU, BUN, CREA, BUCR, GFRAA, GFRNA, CA, TBIL, TBILI, GPT, SGOT, AP, TP, ALB, GLOB, AGRAT, ALT  No results found for: CPK, CPKX, CPX  No results found for: CHOL, CHOLX,  CHLST, CHOLV, [id number], HDL, LDL, DLDL, LDLC, DLDLP, TGL, TGLX, TRIGL, [id number], TRIGP, CHHD, CHHDX  No results found for this or any previous visit.    Assessment:         Patient Active Problem List    Diagnosis Date Noted   ??? Abdominal pain 12/06/2014   ??? Weight loss 12/06/2014   ??? Ventral hernia 12/06/2014   ??? GERD (gastroesophageal reflux disease) 08/18/2014   ??? Obesity 08/18/2014   ??? Dyslipidemia 08/18/2014   ??? Anemia 08/18/2014   ??? TIA (transient ischemic attack) 08/18/2014   ??? 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 HCVD (hypertensive cardiovascular disease)     ??? OSA (obstructive sleep apnea)    ??? COPD (chronic obstructive pulmonary disease) (HCC)    ??? Diabetes (HCC) 03/13/2013     No CV sx or complaints. Mild stable DOE. Lost family member last fall, sx of depression, fatigue, etc.     Plan:     Doing well with no adverse cardiac symptoms.  Lipids and labs followed by PCP.  Continue current care and f/u in 6 months.    Ellwood SayersJohn W Julian Askin, MD

## 2016-04-06 NOTE — Progress Notes (Signed)
Verified patient with two patient identifiers.    Medications reviewed/approved by Dr. Hawkins.

## 2016-10-17 ENCOUNTER — Ambulatory Visit: Admit: 2016-10-17 | Discharge: 2016-10-17 | Payer: MEDICARE | Attending: Cardiovascular Disease | Primary: Family

## 2016-10-17 DIAGNOSIS — I119 Hypertensive heart disease without heart failure: Secondary | ICD-10-CM

## 2016-10-17 NOTE — Progress Notes (Signed)
PATIENT ID VERIFIED WITH TWO PATIENT IDENTIFIERS.  MEDICATION REVIEWED AND APPROVED BY DR. HAWKINS.

## 2016-10-17 NOTE — Progress Notes (Signed)
Michele Mejia is a 75 y.o. female is here for routine f/u.  No CV sx or complaints. Mild stable DOE. Lost family member last fall, sx of depression, fatigue, etc. PCP has stopped atenolol. The patient denies chest pain, orthopnea, PND, LE edema, palpitations, syncope, presyncope or fatigue.       Patient Active Problem List    Diagnosis Date Noted   ??? Abdominal pain 12/06/2014   ??? Weight loss 12/06/2014   ??? Ventral hernia 12/06/2014   ??? GERD (gastroesophageal reflux disease) 08/18/2014   ??? Obesity 08/18/2014   ??? Dyslipidemia 08/18/2014   ??? Anemia 08/18/2014   ??? TIA (transient ischemic attack) 08/18/2014   ??? 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 HCVD (hypertensive cardiovascular disease)    ??? OSA (obstructive sleep apnea)    ??? COPD (chronic obstructive pulmonary disease) (HCC)    ??? Diabetes (HCC) 03/13/2013      [name] Enis GashBoyd Burno, NP  Past Medical History:   Diagnosis Date   ??? Anemia    ??? Asthma    ??? Change in bowel habits    ??? COPD (chronic obstructive pulmonary disease) (HCC)    ??? Depression    ??? DJD (degenerative joint disease)    ??? GERD (gastroesophageal reflux disease)    ??? Glaucoma    ??? Gout    ??? HCVD (hypertensive cardiovascular disease)    ??? Obesity    ??? OSA (obstructive sleep apnea)    ??? Rectal bleed    ??? Ringing in ear    ??? TIA (transient ischemic attack)       Past Surgical History:   Procedure Laterality Date   ??? HX BREAST BIOPSY Bilateral     neg   ??? HX BREAST REDUCTION     ??? HX CATARACT REMOVAL     ??? HX CHOLECYSTECTOMY  2016   ??? HX COLONOSCOPY  6.2014   ??? HX ENDOSCOPY  6.2014    chronic gastritis   ??? HX ENDOSCOPY  12/2014    gastritis   ??? HX HEART CATHETERIZATION  09/2009    EF 50%, Normal Coronaries   ??? HX HIP REPLACEMENT Right    ??? HX HYSTERECTOMY       Allergies   Allergen Reactions   ??? Nuts [Tree Nut] Swelling   ??? Peanut Swelling   ??? Simvastatin Unable to Obtain      Family History   Problem Relation Age of Onset   ??? Hypertension Mother    ??? Stroke Mother     ??? Coronary Artery Disease Sister    ??? Hypertension Sister    ??? Diabetes Sister    ??? Hypertension Brother    ??? Diabetes Brother    ??? Coronary Artery Disease Brother    ??? Cancer Brother      lung   ??? Hypertension Brother    ??? Diabetes Brother    ??? Cancer Brother      prostate   ??? Diabetes Sister       Social History     Social History   ??? Marital status: WIDOWED     Spouse name: N/A   ??? Number of children: N/A   ??? Years of education: N/A     Occupational History   ??? Not on file.     Social History Main Topics   ??? Smoking status: Never Smoker   ??? Smokeless tobacco: Never Used   ??? Alcohol use No   ???  Drug use: Not on file   ??? Sexual activity: Not on file     Other Topics Concern   ??? Not on file     Social History Narrative      Current Outpatient Prescriptions   Medication Sig   ??? CALCIUM PO Take  by mouth. 1200 DAILY   ??? OTHER Vitamin D 50,000 units for 8 weeks then reduce to 2,000 units daily.   ??? clopidogrel (PLAVIX) 75 mg tablet Take  by mouth daily.   ??? aspirin (ASPIRIN) 325 mg tablet Take 325 mg by mouth daily.   ??? losartan-hydrochlorothiazide (HYZAAR) 100-12.5 mg per tablet Take 1 Tab by mouth daily.   ??? albuterol (VENTOLIN HFA) 90 mcg/actuation inhaler Take 2 Puffs by inhalation two (2) times a day.   ??? DILTIAZEM HCL (CARTIA XT PO) Take 240 mg by mouth.   ??? FLUTICASONE/SALMETEROL (ADVAIR DISKUS IN) Take  by inhalation two (2) times a day.   ??? RANITIDINE HCL (ZANTAC PO) Take 150 mg by mouth.   ??? ALLOPURINOL PO Take 100 mg by mouth two (2) times a day.   ??? PRAVASTATIN SODIUM (PRAVASTATIN PO) Take 40 mg by mouth daily.   ??? ESCITALOPRAM OXALATE (LEXAPRO PO) Take 20 mg by mouth daily.     No current facility-administered medications for this visit.          Review of Symptoms:    CONST  No weight change. No fever, chills, sweats    ENT No visual changes, URI sx, sore throat    CV  See HPI   RESP  No cough, or sputum, wheezing. Also see HPI   GI  No abdominal pain or change in bowel habits.   No heartburn or dysphagia.   No melena or rectal bleeding.    GU  No dysuria, urgency, frequency, hematuria   MSKEL  No joint pain, swelling.   No muscle pain.    SKIN  No rash or lesions.    NEURO  No headache, syncope, or seizure.   No weakness, loss of sensation, or paresthesias.    PSYCH  No low mood or depression  No anxiety.    HE/LYMPH  No easy bruising, abnormal bleeding, or enlarged glands.        Physical ExamPhysical Exam: ??  Visit Vitals   ??? BP 128/88 (BP 1 Location: Left arm, BP Patient Position: Sitting)   ??? Pulse 78   ??? Resp 14   ??? Ht 5\' 7"  (1.702 m)   ??? Wt 243 lb (110.2 kg)   ??? SpO2 96%   ??? BMI 38.06 kg/m2     Gen: NAD  HEENT:  PERRL, throat clear  Neck: no adenopathy, no thyromegaly, no JVD   Heart: ??Regular,Nl S1S2,  no murmur, gallop or rub.??  Lungs:  clear  Abdomen:?? ??Soft, non-tender, bowel sounds are active.??  Extremities:  No edema  Pulse: symmetric  Neuro: A&O times 3, No focal neuro deficits    Cardiographics    CARDIAC TESTING    Dipyridamole MPI 2009: partially reversible anterior defect, EF 46%    Lexiscan MPI 03/2013:  Partially reversible inferior defect, EF 40%      Assessment:         Patient Active Problem List    Diagnosis Date Noted   ??? Abdominal pain 12/06/2014   ??? Weight loss 12/06/2014   ??? Ventral hernia 12/06/2014   ??? GERD (gastroesophageal reflux disease) 08/18/2014   ??? Obesity 08/18/2014   ???  Dyslipidemia 08/18/2014   ??? Anemia 08/18/2014   ??? TIA (transient ischemic attack) 08/18/2014   ??? 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 HCVD (hypertensive cardiovascular disease)    ??? OSA (obstructive sleep apnea)    ??? COPD (chronic obstructive pulmonary disease) (HCC)    ??? Diabetes (HCC) 03/13/2013     No CV sx or complaints. Mild stable DOE. Lost family member last fall, sx of depression, fatigue, etc. PCP has stopped atenolol.      Plan:     Doing well with no adverse cardiac symptoms.  Lipids and labs followed by PCP.  Continue current care and f/u in 6 months.     Ellwood SayersJohn W Deeric Cruise, MD

## 2017-05-06 ENCOUNTER — Ambulatory Visit: Admit: 2017-05-06 | Discharge: 2017-05-06 | Payer: MEDICARE | Attending: Cardiovascular Disease | Primary: Family

## 2017-05-06 ENCOUNTER — Encounter: Attending: Cardiovascular Disease | Primary: Family

## 2017-05-06 DIAGNOSIS — I119 Hypertensive heart disease without heart failure: Secondary | ICD-10-CM

## 2017-05-06 NOTE — Telephone Encounter (Signed)
-----   Message from Dola ArgyleDonna U Tinder sent at 05/06/2017  4:48 PM EDT -----  Regarding: Atenolol  Pt states taking Atenolol 50 mg - once daily.  Said Burno prescribed it 07/31/16.

## 2017-05-06 NOTE — Progress Notes (Signed)
PATIENT ID VERIFIED WITH TWO PATIENT IDENTIFIERS.    PATIENT MEDICATIONS REVIEWED AND APPROVED BY DR. Juanetta GoslingHAWKINS.      MEDICATIONS THAT WERE REMOVED FROM THIS VISIT HAVE BEEN APPROVED BY DR. Juanetta GoslingHAWKINS.  PATIENT IS NOT SURE WHETHER SHE IS TAKING ATENOLOL.  SHE WILL CALL BACK TO ADVISE.    Chief Complaint   Patient presents with   ??? Hypertension     6 MO F/U       1. Have you been to the ER, urgent care clinic since your last visit?  Hospitalized since your last visit? No    2. Have you seen or consulted any other health care providers outside of the [facility] since your last visit? YES PCP-[name] Burno, NP for stomach problem several months ago,  GI-Dr. Jacqulyn BathLong for stomach problem on 04/18/17, [facility] Feb[facility] 2018, Renal-Dr. Neldon LabellaGretes for kidney problem on 04/26/17.

## 2017-05-06 NOTE — Progress Notes (Signed)
Michele Mejia is a 76 y.o. female is here for routine f/u. Mild stable DOE. ?still taking atenolol (she says this was stopped).  Continues to see PCP and Nephrology.  The patient denies chest pain/ shortness of breath, orthopnea, PND, LE edema, palpitations, syncope, presyncope or fatigue.       Patient Active Problem List    Diagnosis Date Noted   ??? Abdominal pain 12/06/2014   ??? Weight loss 12/06/2014   ??? Ventral hernia 12/06/2014   ??? GERD (gastroesophageal reflux disease) 08/18/2014   ??? Obesity 08/18/2014   ??? Dyslipidemia 08/18/2014   ??? Anemia 08/18/2014   ??? TIA (transient ischemic attack) 08/18/2014   ??? 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 HCVD (hypertensive cardiovascular disease)    ??? OSA (obstructive sleep apnea)    ??? COPD (chronic obstructive pulmonary disease) (HCC)    ??? Diabetes (HCC) 03/13/2013      [name] Enis Gash, NP  Past Medical History:   Diagnosis Date   ??? Anemia    ??? Asthma    ??? Change in bowel habits    ??? COPD (chronic obstructive pulmonary disease) (HCC)    ??? Depression    ??? DJD (degenerative joint disease)    ??? GERD (gastroesophageal reflux disease)    ??? Glaucoma    ??? Gout    ??? HCVD (hypertensive cardiovascular disease)    ??? Obesity    ??? OSA (obstructive sleep apnea)    ??? Rectal bleed    ??? Ringing in ear    ??? TIA (transient ischemic attack)       Past Surgical History:   Procedure Laterality Date   ??? HX BREAST BIOPSY Bilateral     neg   ??? HX BREAST REDUCTION     ??? HX CATARACT REMOVAL     ??? HX CHOLECYSTECTOMY  2016   ??? HX COLONOSCOPY  6.2014   ??? HX ENDOSCOPY  6.2014    chronic gastritis   ??? HX ENDOSCOPY  12/2014    gastritis   ??? HX HEART CATHETERIZATION  09/2009    EF 50%, Normal Coronaries   ??? HX HIP REPLACEMENT Right    ??? HX HYSTERECTOMY       Allergies   Allergen Reactions   ??? Nuts [Tree Nut] Swelling   ??? Peanut Swelling   ??? Simvastatin Unable to Obtain      Family History   Problem Relation Age of Onset   ??? Hypertension Mother    ??? Stroke Mother     ??? Coronary Artery Disease Sister    ??? Hypertension Sister    ??? Diabetes Sister    ??? Hypertension Brother    ??? Diabetes Brother    ??? Coronary Artery Disease Brother    ??? Cancer Brother      lung   ??? Hypertension Brother    ??? Diabetes Brother    ??? Cancer Brother      prostate   ??? Diabetes Sister       Social History     Social History   ??? Marital status: WIDOWED     Spouse name: N/A   ??? Number of children: N/A   ??? Years of education: N/A     Occupational History   ??? Not on file.     Social History Main Topics   ??? Smoking status: Never Smoker   ??? Smokeless tobacco: Never Used   ??? Alcohol use No   ??? Drug  use: Not on file   ??? Sexual activity: Not on file     Other Topics Concern   ??? Not on file     Social History Narrative      Current Outpatient Prescriptions   Medication Sig   ??? aspirin delayed-release 81 mg tablet Take  by mouth daily.   ??? dilTIAZem CD (CARDIZEM CD) 300 mg ER capsule Take 300 mg by mouth daily.   ??? potassium chloride (KLOR-CON M20) 20 mEq tablet Take  by mouth two (2) times a day.   ??? levothyroxine (SYNTHROID) 25 mcg tablet Take  by mouth Daily (before breakfast).   ??? loratadine 10 mg cap Take  by mouth daily.   ??? losartan-hydroCHLOROthiazide (HYZAAR) 100-25 mg per tablet Take 1 Tab by mouth daily.   ??? pantoprazole (PROTONIX) 40 mg tablet Take 40 mg by mouth daily.   ??? sertraline (ZOLOFT) 50 mg tablet Take  by mouth daily.   ??? spironolactone (ALDACTONE) 25 mg tablet Take  by mouth daily.   ??? CALCIUM PO Take  by mouth. 1200 DAILY   ??? OTHER Vitamin D 50,000 units for 8 weeks then reduce to 2,000 units daily.   ??? clopidogrel (PLAVIX) 75 mg tablet Take  by mouth daily.   ??? albuterol (VENTOLIN HFA) 90 mcg/actuation inhaler Take 2 Puffs by inhalation two (2) times a day.   ??? FLUTICASONE/SALMETEROL (ADVAIR DISKUS IN) Take  by inhalation two (2) times a day.   ??? ALLOPURINOL PO Take 100 mg by mouth two (2) times a day.   ??? PRAVASTATIN SODIUM (PRAVASTATIN PO) Take 40 mg by mouth daily.    ??? ESCITALOPRAM OXALATE (LEXAPRO PO) Take 20 mg by mouth daily.     No current facility-administered medications for this visit.          Review of Symptoms:    CONST  No weight change. No fever, chills, sweats    ENT No visual changes, URI sx, sore throat    CV  See HPI   RESP  No cough, or sputum, wheezing. Also see HPI   GI  No abdominal pain or change in bowel habits.  No heartburn or dysphagia.   No melena or rectal bleeding.    GU  No dysuria, urgency, frequency, hematuria   MSKEL  No joint pain, swelling.   No muscle pain.    SKIN  No rash or lesions.    NEURO  No headache, syncope, or seizure.   No weakness, loss of sensation, or paresthesias.    PSYCH  No low mood or depression  No anxiety.    HE/LYMPH  No easy bruising, abnormal bleeding, or enlarged glands.        Physical ExamPhysical Exam: ??  Visit Vitals   ??? BP 154/88 (BP 1 Location: Right arm, BP Patient Position: Sitting)   ??? Pulse 71   ??? Resp 14   ??? Ht 5\' 7"  (1.702 m)   ??? Wt 243 lb (110.2 kg)   ??? SpO2 97%   ??? BMI 38.06 kg/m2     Gen: NAD  HEENT:  PERRL, throat clear  Neck: no adenopathy, no thyromegaly, no JVD   Heart: ??Regular,Nl S1S2,  no murmur, gallop or rub.??  Lungs:  clear  Abdomen:?? ??Soft, non-tender, bowel sounds are active.??  Extremities:  No edema  Pulse: symmetric  Neuro: A&O times 3, No focal neuro deficits    Cardiographics    ECG: NSR, low voltage, NST  Assessment:         Patient Active Problem List    Diagnosis Date Noted   ??? Abdominal pain 12/06/2014   ??? Weight loss 12/06/2014   ??? Ventral hernia 12/06/2014   ??? GERD (gastroesophageal reflux disease) 08/18/2014   ??? Obesity 08/18/2014   ??? Dyslipidemia 08/18/2014   ??? Anemia 08/18/2014   ??? TIA (transient ischemic attack) 08/18/2014   ??? 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 HCVD (hypertensive cardiovascular disease)    ??? OSA (obstructive sleep apnea)    ??? COPD (chronic obstructive pulmonary disease) (HCC)    ??? Diabetes (HCC) 03/13/2013       Mild stable DOE. ?still taking atenolol (she says this was stopped).  Continues to see PCP and Nephrology.     Plan:     Doing well with no adverse cardiac symptoms.   SBP elevated today, multiple meds  She will check and see if atenolol is still on list  Lipids and labs followed by PCP.    Continue current care and f/u in 6 months.    Ellwood SayersJohn W Yona Kosek, MD

## 2017-11-11 ENCOUNTER — Encounter: Attending: Cardiovascular Disease | Primary: Family

## 2017-11-29 ENCOUNTER — Encounter: Attending: Cardiovascular Disease | Primary: Family

## 2017-12-06 ENCOUNTER — Ambulatory Visit: Admit: 2017-12-06 | Payer: MEDICARE | Attending: Cardiovascular Disease | Primary: Family

## 2017-12-06 DIAGNOSIS — I10 Essential (primary) hypertension: Secondary | ICD-10-CM

## 2017-12-06 NOTE — Progress Notes (Signed)
Michele Mejia is a 76 y.o. female is here for routine f/u. Mild stable DOE. Now on spironolactone 3x/week, labs followed   Continues to see PCP and Nephrology.  Mild dizziness, BP low at times.  The patient denies chest pain, orthopnea, PND, LE edema, palpitations, syncope, presyncope or fatigue.       Patient Active Problem List    Diagnosis Date Noted   ??? CKD (chronic kidney disease)    ??? Abdominal pain 12/06/2014   ??? Weight loss 12/06/2014   ??? Ventral hernia 12/06/2014   ??? GERD (gastroesophageal reflux disease) 08/18/2014   ??? Obesity 08/18/2014   ??? Dyslipidemia 08/18/2014   ??? Anemia 08/18/2014   ??? TIA (transient ischemic attack) 08/18/2014   ??? 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 HCVD (hypertensive cardiovascular disease)    ??? OSA (obstructive sleep apnea)    ??? COPD (chronic obstructive pulmonary disease) (HCC)    ??? Diabetes (HCC) 03/13/2013      Burno, Chase Caller, NP  Past Medical History:   Diagnosis Date   ??? Anemia    ??? Asthma    ??? Change in bowel habits    ??? CKD (chronic kidney disease)    ??? COPD (chronic obstructive pulmonary disease) (HCC)    ??? Depression    ??? DJD (degenerative joint disease)    ??? GERD (gastroesophageal reflux disease)    ??? Glaucoma    ??? Gout    ??? HCVD (hypertensive cardiovascular disease)    ??? Obesity    ??? OSA (obstructive sleep apnea)    ??? Rectal bleed    ??? Ringing in ear    ??? TIA (transient ischemic attack)       Past Surgical History:   Procedure Laterality Date   ??? HX BREAST BIOPSY Bilateral     neg   ??? HX BREAST REDUCTION     ??? HX CATARACT REMOVAL     ??? HX CHOLECYSTECTOMY  2016   ??? HX COLONOSCOPY  6.2014   ??? HX ENDOSCOPY  6.2014    chronic gastritis   ??? HX ENDOSCOPY  12/2014    gastritis   ??? HX HEART CATHETERIZATION  09/2009    EF 50%, Normal Coronaries   ??? HX HIP REPLACEMENT Right    ??? HX HYSTERECTOMY       Allergies   Allergen Reactions   ??? Nuts [Tree Nut] Swelling   ??? Peanut Swelling   ??? Simvastatin Unable to Obtain      Family History    Problem Relation Age of Onset   ??? Hypertension Mother    ??? Stroke Mother    ??? Coronary Artery Disease Sister    ??? Hypertension Sister    ??? Diabetes Sister    ??? Hypertension Brother    ??? Diabetes Brother    ??? Coronary Artery Disease Brother    ??? Cancer Brother         lung   ??? Hypertension Brother    ??? Diabetes Brother    ??? Cancer Brother         prostate   ??? Diabetes Sister       Social History     Socioeconomic History   ??? Marital status: WIDOWED     Spouse name: Not on file   ??? Number of children: Not on file   ??? Years of education: Not on file   ??? Highest education level: Not on file   Social Needs   ???  Financial resource strain: Not on file   ??? Food insecurity - worry: Not on file   ??? Food insecurity - inability: Not on file   ??? Transportation needs - medical: Not on file   ??? Transportation needs - non-medical: Not on file   Occupational History   ??? Not on file   Tobacco Use   ??? Smoking status: Never Smoker   ??? Smokeless tobacco: Never Used   Substance and Sexual Activity   ??? Alcohol use: No   ??? Drug use: Not on file   ??? Sexual activity: Not on file   Other Topics Concern   ??? Not on file   Social History Narrative   ??? Not on file      Current Outpatient Medications   Medication Sig   ??? aspirin delayed-release 81 mg tablet Take  by mouth daily.   ??? dilTIAZem CD (CARDIZEM CD) 300 mg ER capsule Take 300 mg by mouth daily.   ??? potassium chloride (KLOR-CON M20) 20 mEq tablet Take  by mouth two (2) times a day.   ??? levothyroxine (SYNTHROID) 25 mcg tablet Take  by mouth Daily (before breakfast).   ??? loratadine 10 mg cap Take  by mouth daily.   ??? losartan-hydroCHLOROthiazide (HYZAAR) 100-25 mg per tablet Take 1 Tab by mouth daily.   ??? pantoprazole (PROTONIX) 40 mg tablet Take 40 mg by mouth daily.   ??? sertraline (ZOLOFT) 50 mg tablet Take  by mouth daily.   ??? spironolactone (ALDACTONE) 25 mg tablet Take  by mouth daily.   ??? atenolol (TENORMIN) 50 mg tablet Take  by mouth daily.   ??? CALCIUM PO Take  by mouth. 1200 DAILY    ??? OTHER Vitamin D 50,000 units for 8 weeks then reduce to 2,000 units daily.   ??? clopidogrel (PLAVIX) 75 mg tablet Take  by mouth daily.   ??? albuterol (VENTOLIN HFA) 90 mcg/actuation inhaler Take 2 Puffs by inhalation two (2) times a day.   ??? FLUTICASONE/SALMETEROL (ADVAIR DISKUS IN) Take  by inhalation two (2) times a day.   ??? ALLOPURINOL PO Take 100 mg by mouth two (2) times a day.   ??? PRAVASTATIN SODIUM (PRAVASTATIN PO) Take 40 mg by mouth daily.   ??? ESCITALOPRAM OXALATE (LEXAPRO PO) Take 20 mg by mouth daily.     No current facility-administered medications for this visit.          Review of Symptoms:    CONST  No weight change. No fever, chills, sweats    ENT No visual changes, URI sx, sore throat    CV  See HPI   RESP  No cough, or sputum, wheezing. Also see HPI   GI  No abdominal pain or change in bowel habits.  No heartburn or dysphagia.   No melena or rectal bleeding.    GU  No dysuria, urgency, frequency, hematuria   MSKEL  No joint pain, swelling.   No muscle pain.    SKIN  No rash or lesions.    NEURO  No headache, syncope, or seizure.   No weakness, loss of sensation, or paresthesias.    PSYCH  No low mood or depression  No anxiety.    HE/LYMPH  No easy bruising, abnormal bleeding, or enlarged glands.        Physical ExamPhysical Exam: ??  There were no vitals taken for this visit.  Gen: NAD  HEENT:  PERRL, throat clear  Neck: no adenopathy, no thyromegaly, no  JVD   Heart: ??Regular,Nl S1S2,  no murmur, gallop or rub.??  Lungs:  clear  Abdomen:?? ??Soft, non-tender, bowel sounds are active.??  Extremities:  No edema  Pulse: symmetric  Neuro: A&O times 3, No focal neuro deficits    Cardiographics    ECG:       Assessment:         Patient Active Problem List    Diagnosis Date Noted   ??? CKD (chronic kidney disease)    ??? Abdominal pain 12/06/2014   ??? Weight loss 12/06/2014   ??? Ventral hernia 12/06/2014   ??? GERD (gastroesophageal reflux disease) 08/18/2014   ??? Obesity 08/18/2014   ??? Dyslipidemia 08/18/2014    ??? Anemia 08/18/2014   ??? TIA (transient ischemic attack) 08/18/2014   ??? 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 HCVD (hypertensive cardiovascular disease)    ??? OSA (obstructive sleep apnea)    ??? COPD (chronic obstructive pulmonary disease) (HCC)    ??? Diabetes (HCC) 03/13/2013        Plan:     Doing well with no adverse cardiac symptoms.  Lipids and labs followed by PCP.  Continue current care and f/u in 6 months.    Ellwood SayersJohn W Tikita Mabee, MD

## 2017-12-06 NOTE — Progress Notes (Signed)
Verified patient with two patient identifiers.    Medications reviewed/approved by Dr. Juanetta GoslingHawkins.    A verbal from Dr. Juanetta GoslingHawkins was given to remove any medications that were deleted during the visit.   Medication(s) removed: ESCITALOPRAM OXALATE (LEXAPRO PO)   Levothyroxine  loratadine   protonix  KLOR        Chief Complaint   Patient presents with   ??? Hypertension     6 month follow up     1. Have you been to the ER, urgent care clinic since your last visit?  Hospitalized since your last visit?no     2. Have you seen or consulted any other health care providers outside of the [facility] since your last visit?  Include any pap smears or colon screening. Yes, pcp burno last seen Sp[facility] 2018 for routine care.

## 2018-06-25 ENCOUNTER — Encounter: Attending: Cardiovascular Disease | Primary: Family

## 2019-02-10 ENCOUNTER — Ambulatory Visit: Attending: Cardiovascular Disease | Primary: Family

## 2019-02-10 ENCOUNTER — Ambulatory Visit: Admit: 2019-02-10 | Discharge: 2019-02-10 | Payer: MEDICARE | Attending: Cardiovascular Disease | Primary: Family

## 2019-02-10 DIAGNOSIS — R072 Precordial pain: Secondary | ICD-10-CM

## 2019-02-10 NOTE — Progress Notes (Signed)
 PATIENT ID VERIFIED WITH TWO PATIENT IDENTIFIERS.    PATIENT MEDICATIONS REVIEWED AND APPROVED BY DR. VONZELL.          Chief Complaint   Patient presents with   . Chest Pain     14 mo f/u -- complaint of chest pain   . Hypertension   . Cholesterol Problem   . Breathing Problem       1. Have you been to the ER, urgent care clinic since your last visit?  Hospitalized since your last visit? Yes [facility] June 2019 for facial swelling    2. Have you seen or consulted any other health care providers outside of the [facility] since your last visit?   Yes PCP M. 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

## 2019-02-10 NOTE — Progress Notes (Signed)
PATIENT ID VERIFIED WITH TWO PATIENT IDENTIFIERS.    PATIENT MEDICATIONS REVIEWED AND APPROVED BY DR. HAWKINS.          Chief Complaint   Patient presents with   ??? Chest Pain     14 mo f/u -- complaint of chest pain   ??? Hypertension   ??? Cholesterol Problem   ??? Breathing Problem       1. Have you been to the ER, urgent care clinic since your last visit?  Hospitalized since your last visit? Yes [facility] June 2019 for facial swelling    2. Have you seen or consulted any other health care providers outside of the [facility] since your last visit?   Yes PCP M. Burno 01/27/19 for difficulty breathing and chest pain and pulmondary Dr. 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

## 2019-02-10 NOTE — Progress Notes (Signed)
Progress Notes by Ellwood Sayers, MD at 02/10/19 1440                Author: Ellwood Sayers, MD  Service: --  Author Type: Physician       Filed: 02/10/19 1517  Encounter Date: 02/10/2019  Status: Signed          Editor: Ellwood Sayers, MD (Physician)                                  Michele Mejia is a  78 y.o. female is here for routine f/u.  Hx hypertension, DM II, CKD III, obesity, COPD/asthma,  GERD, OSA followed by PCP, Nephrology and recent OV with Pulmonary.  Recent sx of chest tightness--with or without exertion, mild DOE, fatigue. Last Stress MPI 2014. No recent Echo.  The patient denies orthopnea, PND, LE edema, palpitations, syncope,  presyncope.            Patient Active Problem List           Diagnosis  Date Noted         ?  Severe obesity (HCC)  02/10/2019     ?  CKD (chronic kidney disease)       ?  Abdominal pain  12/06/2014     ?  Weight loss  12/06/2014     ?  Ventral hernia  12/06/2014     ?  GERD (gastroesophageal reflux disease)  08/18/2014     ?  Obesity  08/18/2014     ?  Dyslipidemia  08/18/2014     ?  Anemia  08/18/2014     ?  TIA (transient ischemic attack)  08/18/2014     ?  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  HCVD (hypertensive cardiovascular disease)       ?  OSA (obstructive sleep apnea)       ?  COPD (chronic obstructive pulmonary disease) (HCC)           ?  Diabetes (HCC)  03/13/2013         Burno, Chase Caller, NP     Past Medical History:        Diagnosis  Date         ?  Anemia       ?  Asthma       ?  Change in bowel habits       ?  CKD (chronic kidney disease)       ?  COPD (chronic obstructive pulmonary disease) (HCC)       ?  Depression       ?  DJD (degenerative joint disease)       ?  GERD (gastroesophageal reflux disease)       ?  Glaucoma       ?  Gout       ?  HCVD (hypertensive cardiovascular disease)       ?  Obesity       ?  OSA (obstructive sleep apnea)       ?  Rectal bleed       ?  Ringing in ear           ?  TIA (transient  ischemic attack)             Past Surgical History:  Procedure  Laterality  Date          ?  HX BREAST BIOPSY  Bilateral            neg          ?  HX BREAST REDUCTION         ?  HX CATARACT REMOVAL         ?  HX CHOLECYSTECTOMY    2016     ?  HX COLONOSCOPY    6.2014     ?  HX ENDOSCOPY    6.2014          chronic gastritis          ?  HX ENDOSCOPY    12/2014          gastritis          ?  HX HEART CATHETERIZATION    09/2009          EF 50%, Normal Coronaries          ?  HX HIP REPLACEMENT  Right            ?  HX HYSTERECTOMY              Allergies        Allergen  Reactions         ?  Nuts [Tree Nut]  Swelling     ?  Peanut  Swelling         ?  Simvastatin  Unable to Obtain           Family History         Problem  Relation  Age of Onset          ?  Hypertension  Mother       ?  Stroke  Mother       ?  Coronary Artery Disease  Sister       ?  Hypertension  Sister       ?  Diabetes  Sister       ?  Hypertension  Brother       ?  Diabetes  Brother       ?  Coronary Artery Disease  Brother       ?  Cancer  Brother                lung          ?  Hypertension  Brother       ?  Diabetes  Brother       ?  Cancer  Brother                prostate          ?  Diabetes  Sister             Social History          Socioeconomic History         ?  Marital status:  WIDOWED              Spouse name:  Not on file         ?  Number of children:  Not on file     ?  Years of education:  Not on file     ?  Highest education level:  Not on file       Occupational History        ?  Not on file  Social Needs         ?  Financial resource strain:  Not on file        ?  Food insecurity:              Worry:  Not on file         Inability:  Not on file        ?  Transportation needs:              Medical:  Not on file         Non-medical:  Not on file       Tobacco Use         ?  Smoking status:  Never Smoker     ?  Smokeless tobacco:  Never Used       Substance and Sexual Activity         ?  Alcohol use:  No     ?  Drug use:  Not  on file     ?  Sexual activity:  Not on file       Lifestyle        ?  Physical activity:              Days per week:  Not on file         Minutes per session:  Not on file         ?  Stress:  Not on file       Relationships        ?  Social connections:              Talks on phone:  Not on file         Gets together:  Not on file         Attends religious service:  Not on file         Active member of club or organization:  Not on file         Attends meetings of clubs or organizations:  Not on file         Relationship status:  Not on file        ?  Intimate partner violence:              Fear of current or ex partner:  Not on file         Emotionally abused:  Not on file         Physically abused:  Not on file         Forced sexual activity:  Not on file        Other Topics  Concern        ?  Not on file       Social History Narrative        ?  Not on file           Current Outpatient Medications        Medication  Sig         ?  aspirin delayed-release 81 mg tablet  Take  by mouth daily.     ?  dilTIAZem CD (CARDIZEM CD) 300 mg ER capsule  Take 300 mg by mouth daily.     ?  losartan-hydroCHLOROthiazide (HYZAAR) 100-25 mg per tablet  Take 1 Tab by mouth daily.     ?  sertraline (ZOLOFT) 50 mg tablet  Take  by mouth daily.     ?  spironolactone (ALDACTONE) 25 mg tablet  Take  by mouth every Monday, Wednesday, Friday.     ?  atenolol (TENORMIN) 50 mg tablet  Take  by mouth daily.     ?  CALCIUM PO  Take  by mouth. 1200 DAILY     ?  OTHER  Vitamin D 50,000 units for 8 weeks then reduce to 2,000 units daily.     ?  clopidogrel (PLAVIX) 75 mg tablet  Take  by mouth daily.     ?  albuterol (VENTOLIN HFA) 90 mcg/actuation inhaler  Take 2 Puffs by inhalation two (2) times a day.     ?  fluticasone-salmeterol (ADVAIR DISKUS) 250-50 mcg/dose diskus inhaler  Take 1 Puff by inhalation two (2) times a day.     ?  ALLOPURINOL PO  Take 100 mg by mouth two (2) times a day.         ?  PRAVASTATIN SODIUM (PRAVASTATIN PO)  Take 40  mg by mouth daily.          No current facility-administered medications for this visit.              Review of Symptoms:         CONST   No weight change. No fever, chills, sweats      ENT  No visual changes, URI sx, sore throat      CV   See HPI     RESP   No cough, or sputum, wheezing. Also see HPI     GI   No abdominal pain or change in bowel habits.   No heartburn or dysphagia.    No melena or rectal bleeding.      GU   No dysuria, urgency, frequency, hematuria     MSKEL   No joint pain, swelling.    No muscle pain.      SKIN   No rash or lesions.      NEURO   No headache, syncope, or seizure.    No weakness, loss of sensation, or paresthesias.      PSYCH   No low mood or depression   No anxiety.      HE/LYMPH   No easy bruising, abnormal bleeding, or enlarged glands.            Physical ExamPhysical Exam: ??   Visit Vitals      Ht   (1.702 m)     Wt  225 lb (102.1 kg)        BMI  35.24 kg/m??        Gen: NAD   HEENT:  PERRL, throat clear   Neck: no adenopathy, no thyromegaly, no JVD    Heart: ??Regular,Nl S1S2,  no murmur, gallop or rub.??   Lungs:  clear   Abdomen:?? ??Soft, non-tender, bowel sounds are active.??   Extremities:  No edema   Pulse: symmetric   Neuro: A&O times 3, No focal neuro deficits      Cardiographics      ECG: NSR, old IMI, old ant MI/PRWP, low voltage, NST           Assessment:               Patient Active Problem List           Diagnosis  Date Noted         ?  Severe obesity (HCC)  02/10/2019     ?  CKD (chronic kidney disease)       ?  Abdominal pain  12/06/2014     ?  Weight loss  12/06/2014     ?  Ventral hernia  12/06/2014     ?  GERD (gastroesophageal reflux disease)  08/18/2014     ?  Obesity  08/18/2014     ?  Dyslipidemia  08/18/2014     ?  Anemia  08/18/2014     ?  TIA (transient ischemic attack)  08/18/2014     ?  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  HCVD (hypertensive cardiovascular disease)       ?  OSA (obstructive sleep apnea)       ?  COPD (chronic  obstructive pulmonary disease) (HCC)           ?  Diabetes (HCC)  03/13/2013         Hx hypertension, DM II, CKD III, obesity, COPD/asthma, GERD, OSA followed by PCP, Nephrology and recent OV with Pulmonary.  Recent sx of chest tightness--with or without exertion, mild DOE, fatigue. Last Stress MPI 2014. No recent Echo.          Plan:        Lexiscan MPI--r/o ischemia   Echo/doppler--chest pain, COPD, OSA, dyspnea, hypertension   Continue current meds   F/u with PCP, Nephrology, Pulmonary as planned   RTC after testing to discuss      Ellwood Sayers, MD

## 2019-02-10 NOTE — Progress Notes (Signed)
Michele Mejia is a 78 y.o. female is here for routine f/u.  Hx hypertension, DM II, CKD III, obesity, COPD/asthma, GERD, OSA followed by PCP, Nephrology and recent OV with Pulmonary.  Recent sx of chest tightness--with or without exertion, mild DOE, fatigue. Last Stress MPI 2014. No recent Echo.  The patient denies orthopnea, PND, LE edema, palpitations, syncope, presyncope.        Patient Active Problem List    Diagnosis Date Noted   ??? Severe obesity (HCC) 02/10/2019   ??? CKD (chronic kidney disease)    ??? Abdominal pain 12/06/2014   ??? Weight loss 12/06/2014   ??? Ventral hernia 12/06/2014   ??? GERD (gastroesophageal reflux disease) 08/18/2014   ??? Obesity 08/18/2014   ??? Dyslipidemia 08/18/2014   ??? Anemia 08/18/2014   ??? TIA (transient ischemic attack) 08/18/2014   ??? 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 HCVD (hypertensive cardiovascular disease)    ??? OSA (obstructive sleep apnea)    ??? COPD (chronic obstructive pulmonary disease) (HCC)    ??? Diabetes (HCC) 03/13/2013      Burno, Chase Caller, NP  Past Medical History:   Diagnosis Date   ??? Anemia    ??? Asthma    ??? Change in bowel habits    ??? CKD (chronic kidney disease)    ??? COPD (chronic obstructive pulmonary disease) (HCC)    ??? Depression    ??? DJD (degenerative joint disease)    ??? GERD (gastroesophageal reflux disease)    ??? Glaucoma    ??? Gout    ??? HCVD (hypertensive cardiovascular disease)    ??? Obesity    ??? OSA (obstructive sleep apnea)    ??? Rectal bleed    ??? Ringing in ear    ??? TIA (transient ischemic attack)       Past Surgical History:   Procedure Laterality Date   ??? HX BREAST BIOPSY Bilateral     neg   ??? HX BREAST REDUCTION     ??? HX CATARACT REMOVAL     ??? HX CHOLECYSTECTOMY  2016   ??? HX COLONOSCOPY  6.2014   ??? HX ENDOSCOPY  6.2014    chronic gastritis   ??? HX ENDOSCOPY  12/2014    gastritis   ??? HX HEART CATHETERIZATION  09/2009    EF 50%, Normal Coronaries   ??? HX HIP REPLACEMENT Right    ??? HX HYSTERECTOMY       Allergies    Allergen Reactions   ??? Nuts [Tree Nut] Swelling   ??? Peanut Swelling   ??? Simvastatin Unable to Obtain      Family History   Problem Relation Age of Onset   ??? Hypertension Mother    ??? Stroke Mother    ??? Coronary Artery Disease Sister    ??? Hypertension Sister    ??? Diabetes Sister    ??? Hypertension Brother    ??? Diabetes Brother    ??? Coronary Artery Disease Brother    ??? Cancer Brother         lung   ??? Hypertension Brother    ??? Diabetes Brother    ??? Cancer Brother         prostate   ??? Diabetes Sister       Social History     Socioeconomic History   ??? Marital status: WIDOWED     Spouse name: Not on file   ??? Number of children: Not on file   ???  Years of education: Not on file   ??? Highest education level: Not on file   Occupational History   ??? Not on file   Social Needs   ??? Financial resource strain: Not on file   ??? Food insecurity:     Worry: Not on file     Inability: Not on file   ??? Transportation needs:     Medical: Not on file     Non-medical: Not on file   Tobacco Use   ??? Smoking status: Never Smoker   ??? Smokeless tobacco: Never Used   Substance and Sexual Activity   ??? Alcohol use: No   ??? Drug use: Not on file   ??? Sexual activity: Not on file   Lifestyle   ??? Physical activity:     Days per week: Not on file     Minutes per session: Not on file   ??? Stress: Not on file   Relationships   ??? Social connections:     Talks on phone: Not on file     Gets together: Not on file     Attends religious service: Not on file     Active member of club or organization: Not on file     Attends meetings of clubs or organizations: Not on file     Relationship status: Not on file   ??? Intimate partner violence:     Fear of current or ex partner: Not on file     Emotionally abused: Not on file     Physically abused: Not on file     Forced sexual activity: Not on file   Other Topics Concern   ??? Not on file   Social History Narrative   ??? Not on file      Current Outpatient Medications   Medication Sig    ??? aspirin delayed-release 81 mg tablet Take  by mouth daily.   ??? dilTIAZem CD (CARDIZEM CD) 300 mg ER capsule Take 300 mg by mouth daily.   ??? losartan-hydroCHLOROthiazide (HYZAAR) 100-25 mg per tablet Take 1 Tab by mouth daily.   ??? sertraline (ZOLOFT) 50 mg tablet Take  by mouth daily.   ??? spironolactone (ALDACTONE) 25 mg tablet Take  by mouth every Monday, Wednesday, Friday.   ??? atenolol (TENORMIN) 50 mg tablet Take  by mouth daily.   ??? CALCIUM PO Take  by mouth. 1200 DAILY   ??? OTHER Vitamin D 50,000 units for 8 weeks then reduce to 2,000 units daily.   ??? clopidogrel (PLAVIX) 75 mg tablet Take  by mouth daily.   ??? albuterol (VENTOLIN HFA) 90 mcg/actuation inhaler Take 2 Puffs by inhalation two (2) times a day.   ??? fluticasone-salmeterol (ADVAIR DISKUS) 250-50 mcg/dose diskus inhaler Take 1 Puff by inhalation two (2) times a day.   ??? ALLOPURINOL PO Take 100 mg by mouth two (2) times a day.   ??? PRAVASTATIN SODIUM (PRAVASTATIN PO) Take 40 mg by mouth daily.     No current facility-administered medications for this visit.          Review of Symptoms:    CONST  No weight change. No fever, chills, sweats    ENT No visual changes, URI sx, sore throat    CV  See HPI   RESP  No cough, or sputum, wheezing. Also see HPI   GI  No abdominal pain or change in bowel habits.  No heartburn or dysphagia.   No melena or rectal bleeding.  GU  No dysuria, urgency, frequency, hematuria   MSKEL  No joint pain, swelling.   No muscle pain.    SKIN  No rash or lesions.    NEURO  No headache, syncope, or seizure.   No weakness, loss of sensation, or paresthesias.    PSYCH  No low mood or depression  No anxiety.    HE/LYMPH  No easy bruising, abnormal bleeding, or enlarged glands.        Physical ExamPhysical Exam: ??  Visit Vitals  Ht  (1.702 m)   Wt 225 lb (102.1 kg)   BMI 35.24 kg/m??     Gen: NAD  HEENT:  PERRL, throat clear  Neck: no adenopathy, no thyromegaly, no JVD   Heart: ??Regular,Nl S1S2,  no murmur, gallop or rub.??   Lungs:  clear  Abdomen:?? ??Soft, non-tender, bowel sounds are active.??  Extremities:  No edema  Pulse: symmetric  Neuro: A&O times 3, No focal neuro deficits    Cardiographics    ECG: NSR, old IMI, old ant MI/PRWP, low voltage, NST      Assessment:         Patient Active Problem List    Diagnosis Date Noted   ??? Severe obesity (HCC) 02/10/2019   ??? CKD (chronic kidney disease)    ??? Abdominal pain 12/06/2014   ??? Weight loss 12/06/2014   ??? Ventral hernia 12/06/2014   ??? GERD (gastroesophageal reflux disease) 08/18/2014   ??? Obesity 08/18/2014   ??? Dyslipidemia 08/18/2014   ??? Anemia 08/18/2014   ??? TIA (transient ischemic attack) 08/18/2014   ??? 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 HCVD (hypertensive cardiovascular disease)    ??? OSA (obstructive sleep apnea)    ??? COPD (chronic obstructive pulmonary disease) (HCC)    ??? Diabetes (HCC) 03/13/2013      Hx hypertension, DM II, CKD III, obesity, COPD/asthma, GERD, OSA followed by PCP, Nephrology and recent OV with Pulmonary.  Recent sx of chest tightness--with or without exertion, mild DOE, fatigue. Last Stress MPI 2014. No recent Echo.      Plan:     Lexiscan MPI--r/o ischemia  Echo/doppler--chest pain, COPD, OSA, dyspnea, hypertension  Continue current meds  F/u with PCP, Nephrology, Pulmonary as planned  RTC after testing to discuss    Ellwood Sayers, MD

## 2019-02-18 ENCOUNTER — Inpatient Hospital Stay: Admit: 2019-02-18 | Payer: MEDICARE | Attending: Cardiovascular Disease | Primary: Family

## 2019-02-18 DIAGNOSIS — R072 Precordial pain: Secondary | ICD-10-CM

## 2019-02-18 LAB — ECHO (TTE) COMPLETE (PRN CONTRAST/BUBBLE/STRAIN/3D)
AR Max Velocity PISA: 333.6 cm/s
AR PHT: 591.8 cm
AV Mean Gradient: 6.3 mm[Hg]
AV Mean Velocity: 1.1655 m/s
AV Peak Gradient: 15.1 mm[Hg]
AV Peak Gradient: 44.5156 mm[Hg]
AV Peak Velocity: 194.46 cm/s
AV VTI: 41.4 cm
Aortic Root: 3.67 cm
Est. RA Pressure: 10 mm[Hg]
Fractional Shortening 2D: 28.8243 %
IVSd: 1.14 cm — AB (ref 0.6–0.9)
LA Major Axis: 3.18 cm
LA/AO Root Ratio: 0.86
LV EDV Teich: 0.6867 mL
LV ESV Teich: 0.3074 mL
LV ESV Teich: 29.3888 mL
LV Mass 2D: 243.3 g — AB (ref 67–162)
LVIDd: 4.97 cm (ref 3.9–5.3)
LVIDs: 3.54 cm
LVOT Diameter: 2.16 cm
LVPWd: 1.06 cm — AB (ref 0.6–0.9)
Left Ventricular Ejection Fraction: 58
MV A Velocity: 79.75 cm/s
MV E Velocity: 70.12 cm/s
MV E Wave Deceleration Time: 213.9 ms
MV E/A: 0.88
Mitral Valve E-F Slope by M-mode: 3.2785
PASP: 29.9 mm[Hg]
RVIDd: 1.11 cm
RVSP: 29.9 mm[Hg]
TR Max Velocity: 222.93 cm/s
TR Peak Gradient: 19.9 mm[Hg]

## 2019-02-18 LAB — ECHO ADULT COMPLETE
AR Max Vel: 333.6 cm/s
AR PHT: 591.8 cm
AV Mean Gradient: 6.3 mmHg
AV Mean Velocity: 1.1655 m/s
AV Peak Gradient: 15.1 mmHg
AV Peak Velocity: 194.46 cm/s
AV VTI: 41.4 cm
AV peak gradient: 44.5156 mmHg
Aortic Root: 3.67 cm
Est. RA Pressure: 10 mmHg
IVSd: 1.14 cm — AB (ref 0.6–0.9)
LA Major Axis: 3.18 cm
LA/AO Root Ratio: 0.86
LV EDV Teich: 0.6867 mL
LV ESV Teich: 0.3074 mL
LV Mass 2D: 243.3 g — AB (ref 67–162)
LVIDd: 4.97 cm (ref 3.9–5.3)
LVIDs: 3.54 cm
LVOT Diameter: 2.16 cm
LVPWd: 1.06 cm — AB (ref 0.6–0.9)
Left Ventricular Fractional Shortening by 2D: 28.8243 %
Left Ventricular Stroke Volume by Teichholz Method: 29.3888 mL
MV A Velocity: 79.75 cm/s
MV E Velocity: 70.12 cm/s
MV E Wave Deceleration Time: 213.9 ms
MV E/A: 0.88
Mitral Valve Deceleration Slope: 3.2785
PASP: 29.9 mmHg
RVIDd: 1.11 cm
RVSP: 29.9 mmHg
TR Max Velocity: 222.93 cm/s
TR Peak Gradient: 19.9 mmHg

## 2019-03-17 ENCOUNTER — Ambulatory Visit: Payer: MEDICARE | Primary: Family

## 2019-05-19 ENCOUNTER — Ambulatory Visit: Payer: MEDICARE | Primary: Family

## 2019-06-09 ENCOUNTER — Inpatient Hospital Stay: Admit: 2019-06-09 | Payer: MEDICARE | Attending: Cardiovascular Disease | Primary: Family

## 2019-06-09 DIAGNOSIS — I119 Hypertensive heart disease without heart failure: Secondary | ICD-10-CM

## 2019-06-09 LAB — NM STRESS TEST WITH MYOCARDIAL PERFUSION
Baseline Diastolic BP: 85 mmHg
Baseline HR: 48 {beats}/min
Baseline Systolic BP: 125 mmHg
Exercise Duration Time: 4.32 min:sec
Left Ventricular Ejection Fraction: 59
Stress Diastolic BP: 84 mmHg
Stress Estimated Workload: 1 METS
Stress Peak HR: 71 {beats}/min
Stress Percent HR Achieved: 49 %
Stress Rate Pressure Product: 9301 bpm*mmHg
Stress ST Depression: 0 mm
Stress ST Elevation: 0 mm
Stress Systolic BP: 131 mmHg
Stress Target HR: 144 {beats}/min

## 2019-06-09 LAB — NUCLEAR CARDIAC STRESS TEST
Baseline Diastolic BP: 85 mmHg
Baseline HR: 48 {beats}/min
Baseline Systolic BP: 125 mmHg
Exercise Duration Time: 4.32 min:sec
Stress Diastolic BP: 84 mmHg
Stress Estimated Workload: 1 METS
Stress Peak HR: 71 {beats}/min
Stress Percent HR Achieved: 49 %
Stress Rate Pressure Product: 9301 bpm*mmHg
Stress ST Depression: 0 mm
Stress ST Elevation: 0 mm
Stress Systolic BP: 131 mmHg
Stress Target HR: 144 {beats}/min

## 2019-06-09 MED ORDER — REGADENOSON 0.4 MG/5 ML IV SYRINGE
0.4 mg/5 mL | Freq: Once | INTRAVENOUS | Status: AC
Start: 2019-06-09 — End: 2019-06-09
  Administered 2019-06-09: 12:00:00 via INTRAVENOUS

## 2019-06-09 MED ORDER — TECHNETIUM TC 99M SESTAMIBI - CARDIOLITE
Freq: Once | Status: AC
Start: 2019-06-09 — End: 2019-06-09
  Administered 2019-06-09: 11:00:00 via INTRAVENOUS

## 2019-06-09 MED ORDER — TECHNETIUM TC 99M SESTAMIBI - CARDIOLITE
Freq: Once | Status: AC
Start: 2019-06-09 — End: 2019-06-09
  Administered 2019-06-09: 12:00:00 via INTRAVENOUS

## 2019-06-09 MED FILL — LEXISCAN 0.4 MG/5 ML INTRAVENOUS SYRINGE: 0.4 mg/5 mL | INTRAVENOUS | Qty: 5

## 2019-06-12 NOTE — Telephone Encounter (Addendum)
-----   Message from Ellwood Sayers, MD sent at 06/09/2019  3:17 PM EDT -----  Regarding: stress nuclear scan  Advise stress nuclear scan normal, no blockages seen.  6 month followup.  Thanks [facility]    Spoke with the patient.  Verified patient with two patient identifiers.    Results given and questions answered.  Patient verbalized understanding.  appt created for August.

## 2019-08-04 ENCOUNTER — Encounter: Attending: Cardiovascular Disease | Primary: Family

## 2019-11-05 ENCOUNTER — Ambulatory Visit: Attending: Cardiovascular Disease | Primary: Family

## 2019-11-05 ENCOUNTER — Ambulatory Visit: Admit: 2019-11-05 | Payer: MEDICARE | Attending: Cardiovascular Disease | Primary: Family

## 2019-11-05 DIAGNOSIS — I10 Essential (primary) hypertension: Secondary | ICD-10-CM

## 2019-11-05 NOTE — Progress Notes (Signed)
Verified patient with two patient identifiers.    Medications reviewed/approved by Dr. Juanetta Gosling.    Chief Complaint   Patient presents with   . Hypertension     follow up     1. Have you been to the ER, urgent care clinic since your last visit?  Hospitalized since your last visit?no    2. Have you seen or consulted any other health care providers outside of the [facility] since your last visit?  Include any pap smears or colon screening. Yes, Montey Hora, MD l/s 10/19/19 for edema, dizziness.

## 2019-11-05 NOTE — Progress Notes (Signed)
Progress Notes by Ellwood Sayers, MD at 11/05/19 1100                Author: Ellwood Sayers, MD  Service: --  Author Type: Physician       Filed: 11/05/19 1141  Encounter Date: 11/05/2019  Status: Signed          Editor: Ellwood Sayers, MD (Physician)                                  Michele Mejia is a  78 y.o. female is here for routine f/u.  Hx hypertension, DM II, CKD III, obesity, COPD/asthma, GERD, OSA followed by PCP, Nephrology and recent OV with Pulmonary.  Mild stable DOE, intermittent chest tightness, some leg swelling.  Lexiscan  MPI 06/09/19 normal perfusion, LVEF 59%, no ischemia.  Echo 02/18/19 with LVEF 55-60, borderline LVH, grade I diastolic, valves ok, normal RVSP.  The patient denies chest pain, orthopnea, PND,  palpitations, syncope, presyncope or fatigue.           Patient Active Problem List           Diagnosis  Date Noted         ?  Severe obesity (HCC)  02/10/2019     ?  CKD (chronic kidney disease)       ?  Abdominal pain  12/06/2014     ?  Weight loss  12/06/2014     ?  Ventral hernia  12/06/2014     ?  GERD (gastroesophageal reflux disease)  08/18/2014     ?  Obesity  08/18/2014     ?  Dyslipidemia  08/18/2014     ?  Anemia  08/18/2014     ?  TIA (transient ischemic attack)  08/18/2014     ?  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  HCVD (hypertensive cardiovascular disease)       ?  OSA (obstructive sleep apnea)       ?  COPD (chronic obstructive pulmonary disease) (HCC)           ?  Diabetes (HCC)  03/13/2013         Burno, Chase Caller, NP     Past Medical History:        Diagnosis  Date         ?  Anemia       ?  Asthma       ?  Change in bowel habits       ?  CKD (chronic kidney disease)       ?  COPD (chronic obstructive pulmonary disease) (HCC)       ?  Depression       ?  DJD (degenerative joint disease)       ?  GERD (gastroesophageal reflux disease)       ?  Glaucoma       ?  Gout       ?  HCVD (hypertensive cardiovascular disease)       ?   Obesity       ?  OSA (obstructive sleep apnea)       ?  Rectal bleed       ?  Ringing in ear           ?  TIA (transient ischemic attack)  Past Surgical History:         Procedure  Laterality  Date          ?  HX BREAST BIOPSY  Bilateral            neg          ?  HX BREAST REDUCTION         ?  HX CATARACT REMOVAL         ?  HX CHOLECYSTECTOMY    2016     ?  HX COLONOSCOPY    6.2014     ?  HX ENDOSCOPY    6.2014          chronic gastritis          ?  HX ENDOSCOPY    12/2014          gastritis          ?  HX HEART CATHETERIZATION    09/2009          EF 50%, Normal Coronaries          ?  HX HIP REPLACEMENT  Right            ?  HX HYSTERECTOMY              Allergies        Allergen  Reactions         ?  Nuts [Tree Nut]  Swelling     ?  Peanut  Swelling         ?  Simvastatin  Unable to Obtain           Family History         Problem  Relation  Age of Onset          ?  Hypertension  Mother       ?  Stroke  Mother       ?  Coronary Artery Disease  Sister       ?  Hypertension  Sister       ?  Diabetes  Sister       ?  Hypertension  Brother       ?  Diabetes  Brother       ?  Coronary Artery Disease  Brother       ?  Cancer  Brother                lung          ?  Hypertension  Brother       ?  Diabetes  Brother       ?  Cancer  Brother                prostate          ?  Diabetes  Sister             Social History          Socioeconomic History         ?  Marital status:  WIDOWED              Spouse name:  Not on file         ?  Number of children:  Not on file     ?  Years of education:  Not on file     ?  Highest education level:  Not on file       Occupational History        ?  Not on file       Social Needs         ?  Financial resource strain:  Not on file        ?  Food insecurity              Worry:  Not on file         Inability:  Not on file        ?  Transportation needs              Medical:  Not on file         Non-medical:  Not on file       Tobacco Use         ?  Smoking status:  Never Smoker      ?  Smokeless tobacco:  Never Used       Substance and Sexual Activity         ?  Alcohol use:  No     ?  Drug use:  Not on file     ?  Sexual activity:  Not on file       Lifestyle        ?  Physical activity              Days per week:  Not on file         Minutes per session:  Not on file         ?  Stress:  Not on file       Relationships        ?  Social Health visitor on phone:  Not on file         Gets together:  Not on file         Attends religious service:  Not on file         Active member of club or organization:  Not on file         Attends meetings of clubs or organizations:  Not on file         Relationship status:  Not on file        ?  Intimate partner violence              Fear of current or ex partner:  Not on file         Emotionally abused:  Not on file         Physically abused:  Not on file         Forced sexual activity:  Not on file        Other Topics  Concern        ?  Not on file       Social History Narrative        ?  Not on file           Current Outpatient Medications        Medication  Sig         ?  fluticasone propionate (FLONASE NA)  by Nasal route.     ?  indapamide (LOZOL) 2.5 mg tablet  Take  by mouth daily.     ?  pantoprazole sodium (PANTOPRAZOLE PO)  Take  by mouth.     ?  azelastine-fluticasone 137-50 mcg/spray spry  by Nasal route two (2) times a day.     ?  gabapentin (NEURONTIN) 100 mg capsule  Take  by mouth nightly.     ?  meclizine (ANTIVERT) 12.5 mg tablet  Take  by mouth three (3) times daily as needed for Dizziness.     ?  montelukast (SINGULAIR) 10 mg tablet  Take 10 mg by mouth nightly.     ?  raNITIdine (ZANTAC) 150 mg tablet  Take 150 mg by mouth daily.     ?  tiotropium (SPIRIVA WITH HANDIHALER) 18 mcg inhalation capsule  Take 1 Cap by inhalation daily.     ?  aspirin delayed-release 81 mg tablet  Take  by mouth daily.     ?  dilTIAZem CD (CARDIZEM CD) 300 mg ER capsule  Take 300 mg by mouth daily.     ?  sertraline (ZOLOFT) 50 mg tablet  Take   by mouth daily.     ?  spironolactone (ALDACTONE) 25 mg tablet  Take 25 mg by mouth daily.     ?  atenolol (TENORMIN) 50 mg tablet  Take  by mouth daily.     ?  CALCIUM PO  Take  by mouth. 1200 DAILY     ?  OTHER  Vitamin D 50,000 units for 8 weeks then reduce to 2,000 units daily.     ?  clopidogrel (PLAVIX) 75 mg tablet  Take  by mouth daily.     ?  albuterol (VENTOLIN HFA) 90 mcg/actuation inhaler  Take 2 Puffs by inhalation two (2) times a day.     ?  fluticasone propion-salmeteroL (Advair Diskus) 500-50 mcg/dose diskus inhaler  Take 1 Puff by inhalation two (2) times a day. Indications: Currently taking 500-33mcg/dose diskus one puff twice a day     ?  ALLOPURINOL PO  Take 100 mg by mouth two (2) times a day.         ?  PRAVASTATIN SODIUM (PRAVASTATIN PO)  Take 40 mg by mouth daily.          No current facility-administered medications for this visit.              Review of Symptoms:         CONST   No weight change. No fever, chills, sweats      ENT  No visual changes, URI sx, sore throat      CV   See HPI     RESP   No cough, or sputum, wheezing. Also see HPI     GI   No abdominal pain or change in bowel habits.   No heartburn or dysphagia.    No melena or rectal bleeding.      GU   No dysuria, urgency, frequency, hematuria     MSKEL   No joint pain, swelling.    No muscle pain.      SKIN   No rash or lesions.      NEURO   No headache, syncope, or seizure.    No weakness, loss of sensation, or paresthesias.      PSYCH   No low mood or depression   No anxiety.         HE/LYMPH   No easy bruising, abnormal bleeding, or enlarged glands.            Physical ExamPhysical Exam:     Visit Vitals      BP  102/70 (BP 1 Location: Left arm, BP Patient Position: Sitting)     Pulse  64  Temp  96.9 ??F (36.1 ??C) (Temporal)     Resp  16     Ht  5\' 7"  (1.702 m)     Wt  233 lb (105.7 kg)         SpO2  97%  Comment: ra        BMI  36.49 kg/m??        Gen: NAD   HEENT:  PERRL, throat clear   Neck: no adenopathy, no  thyromegaly, no JVD    Heart:  Regular,Nl S1S2,  no murmur, gallop or rub.    Lungs:  clear   Abdomen:   Soft, non-tender, bowel sounds are active.    Extremities:  Mild edema   Pulse: symmetric   Neuro: A&O times 3, No focal neuro deficits                 Assessment:               Patient Active Problem List           Diagnosis  Date Noted         ?  Severe obesity (HCC)  02/10/2019     ?  CKD (chronic kidney disease)       ?  Abdominal pain  12/06/2014     ?  Weight loss  12/06/2014     ?  Ventral hernia  12/06/2014     ?  GERD (gastroesophageal reflux disease)  08/18/2014     ?  Obesity  08/18/2014     ?  Dyslipidemia  08/18/2014     ?  Anemia  08/18/2014     ?  TIA (transient ischemic attack)  08/18/2014     ?  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  HCVD (hypertensive cardiovascular disease)       ?  OSA (obstructive sleep apnea)       ?  COPD (chronic obstructive pulmonary disease) (HCC)           ?  Diabetes (HCC)  03/13/2013         Hx hypertension, DM II, CKD III, obesity, COPD/asthma, GERD, OSA followed by PCP, Nephrology and recent OV with Pulmonary.  Mild stable DOE, intermittent  chest tightness.  Lexiscan MPI 06/09/19 normal perfusion, LVEF 59%, no ischemia.  Echo 02/18/19 with LVEF 55-60, borderline LVH, grade I diastolic, valves ok, normal RVSP.         Plan:        Doing well with no adverse cardiac symptoms.  Lipids and labs followed by PCP.  Continue current care and f/u in 6 months.      Ellwood SayersJohn W Martrice Apt, MD

## 2019-11-05 NOTE — Progress Notes (Signed)
Verified patient with two patient identifiers.    Medications reviewed/approved by Dr. Hawkins.    Chief Complaint   Patient presents with   ??? Hypertension     follow up     1. Have you been to the ER, urgent care clinic since your last visit?  Hospitalized since your last visit?no    2. Have you seen or consulted any other health care providers outside of the [facility] since your last visit?  Include any pap smears or colon screening. Yes, pcp Jenkins-Haynie, MD l/s 10/19/19 for edema, dizziness.

## 2019-11-05 NOTE — Progress Notes (Signed)
Michele Mejia is a 78 y.o. female is here for routine f/u. Hx hypertension, DM II, CKD III, obesity, COPD/asthma, GERD, OSA followed by PCP, Nephrology and recent OV with Pulmonary.  Mild stable DOE, intermittent chest tightness, some leg swelling.  Lexiscan MPI 06/09/19 normal perfusion, LVEF 59%, no ischemia.  Echo 02/18/19 with LVEF 55-60, borderline LVH, grade I diastolic, valves ok, normal RVSP.  The patient denies chest pain, orthopnea, PND,  palpitations, syncope, presyncope or fatigue.       Patient Active Problem List    Diagnosis Date Noted   ??? Severe obesity ([place]) 02/10/2019   ??? CKD (chronic kidney disease)    ??? Abdominal pain 12/06/2014   ??? Weight loss 12/06/2014   ??? Ventral hernia 12/06/2014   ??? GERD (gastroesophageal reflux disease) 08/18/2014   ??? Obesity 08/18/2014   ??? Dyslipidemia 08/18/2014   ??? Anemia 08/18/2014   ??? TIA (transient ischemic attack) 08/18/2014   ??? 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 HCVD (hypertensive cardiovascular disease)    ??? OSA (obstructive sleep apnea)    ??? COPD (chronic obstructive pulmonary disease) ([place])    ??? Diabetes ([place]) 03/13/2013      Burno, Sharrell Ku, NP  Past Medical History:   Diagnosis Date   ??? Anemia    ??? Asthma    ??? Change in bowel habits    ??? CKD (chronic kidney disease)    ??? COPD (chronic obstructive pulmonary disease) ([place])    ??? Depression    ??? DJD (degenerative joint disease)    ??? GERD (gastroesophageal reflux disease)    ??? Glaucoma    ??? Gout    ??? HCVD (hypertensive cardiovascular disease)    ??? Obesity    ??? OSA (obstructive sleep apnea)    ??? Rectal bleed    ??? Ringing in ear    ??? TIA (transient ischemic attack)       Past Surgical History:   Procedure Laterality Date   ??? HX BREAST BIOPSY Bilateral     neg   ??? HX BREAST REDUCTION     ??? HX CATARACT REMOVAL     ??? HX CHOLECYSTECTOMY  2016   ??? HX COLONOSCOPY  6.2014   ??? HX ENDOSCOPY  6.2014    chronic gastritis   ??? HX ENDOSCOPY  12/2014    gastritis    ??? HX HEART CATHETERIZATION  09/2009    EF 50%, Normal Coronaries   ??? HX HIP REPLACEMENT Right    ??? HX HYSTERECTOMY       Allergies   Allergen Reactions   ??? Nuts [Tree Nut] Swelling   ??? Peanut Swelling   ??? Simvastatin Unable to Obtain      Family History   Problem Relation Age of Onset   ??? Hypertension Mother    ??? Stroke Mother    ??? Coronary Artery Disease Sister    ??? Hypertension Sister    ??? Diabetes Sister    ??? Hypertension Brother    ??? Diabetes Brother    ??? Coronary Artery Disease Brother    ??? Cancer Brother         lung   ??? Hypertension Brother    ??? Diabetes Brother    ??? Cancer Brother         prostate   ??? Diabetes Sister       Social History     Socioeconomic History   ??? Marital status: WIDOWED  Spouse name: Not on file   ??? Number of children: Not on file   ??? Years of education: Not on file   ??? Highest education level: Not on file   Occupational History   ??? Not on file   Social Needs   ??? Financial resource strain: Not on file   ??? Food insecurity     Worry: Not on file     Inability: Not on file   ??? Transportation needs     Medical: Not on file     Non-medical: Not on file   Tobacco Use   ??? Smoking status: Never Smoker   ??? Smokeless tobacco: Never Used   Substance and Sexual Activity   ??? Alcohol use: No   ??? Drug use: Not on file   ??? Sexual activity: Not on file   Lifestyle   ??? Physical activity     Days per week: Not on file     Minutes per session: Not on file   ??? Stress: Not on file   Relationships   ??? Social Wellsite geologistconnections     Talks on phone: Not on file     Gets together: Not on file     Attends religious service: Not on file     Active member of club or organization: Not on file     Attends meetings of clubs or organizations: Not on file     Relationship status: Not on file   ??? Intimate partner violence     Fear of current or ex partner: Not on file     Emotionally abused: Not on file     Physically abused: Not on file     Forced sexual activity: Not on file   Other Topics Concern   ??? Not on file    Social History Narrative   ??? Not on file      Current Outpatient Medications   Medication Sig   ??? fluticasone propionate (FLONASE NA) by Nasal route.   ??? indapamide (LOZOL) 2.5 mg tablet Take  by mouth daily.   ??? pantoprazole sodium (PANTOPRAZOLE PO) Take  by mouth.   ??? azelastine-fluticasone 137-50 mcg/spray spry by Nasal route two (2) times a day.   ??? gabapentin (NEURONTIN) 100 mg capsule Take  by mouth nightly.   ??? meclizine (ANTIVERT) 12.5 mg tablet Take  by mouth three (3) times daily as needed for Dizziness.   ??? montelukast (SINGULAIR) 10 mg tablet Take 10 mg by mouth nightly.   ??? raNITIdine (ZANTAC) 150 mg tablet Take 150 mg by mouth daily.   ??? tiotropium (SPIRIVA WITH HANDIHALER) 18 mcg inhalation capsule Take 1 Cap by inhalation daily.   ??? aspirin delayed-release 81 mg tablet Take  by mouth daily.   ??? dilTIAZem CD (CARDIZEM CD) 300 mg ER capsule Take 300 mg by mouth daily.   ??? sertraline (ZOLOFT) 50 mg tablet Take  by mouth daily.   ??? spironolactone (ALDACTONE) 25 mg tablet Take 25 mg by mouth daily.   ??? atenolol (TENORMIN) 50 mg tablet Take  by mouth daily.   ??? CALCIUM PO Take  by mouth. 1200 DAILY   ??? OTHER Vitamin D 50,000 units for 8 weeks then reduce to 2,000 units daily.   ??? clopidogrel (PLAVIX) 75 mg tablet Take  by mouth daily.   ??? albuterol (VENTOLIN HFA) 90 mcg/actuation inhaler Take 2 Puffs by inhalation two (2) times a day.   ??? fluticasone propion-salmeteroL (Advair Diskus) 500-50 mcg/dose diskus inhaler  Take 1 Puff by inhalation two (2) times a day. Indications: Currently taking 500-8mcg/dose diskus one puff twice a day   ??? ALLOPURINOL PO Take 100 mg by mouth two (2) times a day.   ??? PRAVASTATIN SODIUM (PRAVASTATIN PO) Take 40 mg by mouth daily.     No current facility-administered medications for this visit.          Review of Symptoms:    CONST  No weight change. No fever, chills, sweats    ENT No visual changes, URI sx, sore throat    CV  See HPI    RESP  No cough, or sputum, wheezing. Also see HPI   GI  No abdominal pain or change in bowel habits.  No heartburn or dysphagia.   No melena or rectal bleeding.    GU  No dysuria, urgency, frequency, hematuria   MSKEL  No joint pain, swelling.   No muscle pain.    SKIN  No rash or lesions.    NEURO  No headache, syncope, or seizure.   No weakness, loss of sensation, or paresthesias.    PSYCH  No low mood or depression  No anxiety.    HE/LYMPH  No easy bruising, abnormal bleeding, or enlarged glands.        Physical ExamPhysical Exam:    Visit Vitals  BP 102/70 (BP 1 Location: Left arm, BP Patient Position: Sitting)   Pulse 64   Temp 96.9 ??F (36.1 ??C) (Temporal)   Resp 16   Ht 5\' 7"  (1.702 m)   Wt 233 lb (105.7 kg)   SpO2 97% Comment: ra   BMI 36.49 kg/m??     Gen: NAD  HEENT:  PERRL, throat clear  Neck: no adenopathy, no thyromegaly, no JVD   Heart:  Regular,Nl S1S2,  no murmur, gallop or rub.   Lungs:  clear  Abdomen:   Soft, non-tender, bowel sounds are active.   Extremities:  Mild edema  Pulse: symmetric  Neuro: A&O times 3, No focal neuro deficits          Assessment:         Patient Active Problem List    Diagnosis Date Noted   ??? Severe obesity (H[facility]) 02/10/2019   ??? CKD (chronic kidney disease)    ??? Abdominal pain 12/06/2014   ??? Weight loss 12/06/2014   ??? Ventral hernia 12/06/2014   ??? GERD (gastroesophageal reflux disease) 08/18/2014   ??? Obesity 08/18/2014   ??? Dyslipidemia 08/18/2014   ??? Anemia 08/18/2014   ??? TIA (transient ischemic attack) 08/18/2014   ??? 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 HCVD (hypertensive cardiovascular disease)    ??? OSA (obstructive sleep apnea)    ??? COPD (chronic obstructive pulmonary disease) (H[facility])    ??? Diabetes ([facility]) 03/13/2013      Hx hypertension, DM II, CKD III, obesity, COPD/asthma, GERD, OSA followed by PCP, Nephrology and recent OV with Pulmonary.  Mild stable DOE, intermittent chest tightness.  Lexiscan MPI 06/09/19 normal perfusion, LVEF  59%, no ischemia.  Echo 02/18/19 with LVEF 55-60, borderline LVH, grade I diastolic, valves ok, normal RVSP.     Plan:     Doing well with no adverse cardiac symptoms.  Lipids and labs followed by PCP.  Continue current care and f/u in 6 months.    04/20/19, MD

## 2020-02-01 ENCOUNTER — Encounter

## 2020-02-01 ENCOUNTER — Inpatient Hospital Stay: Admit: 2020-02-01 | Payer: MEDICARE | Primary: Family

## 2020-02-01 DIAGNOSIS — M1612 Unilateral primary osteoarthritis, left hip: Secondary | ICD-10-CM

## 2020-05-05 ENCOUNTER — Ambulatory Visit: Attending: Cardiovascular Disease | Primary: Family

## 2020-05-05 ENCOUNTER — Encounter

## 2020-05-05 ENCOUNTER — Institutional Professional Consult (permissible substitution): Admit: 2020-05-05 | Payer: MEDICARE | Primary: Family

## 2020-05-05 ENCOUNTER — Ambulatory Visit: Admit: 2020-05-05 | Payer: MEDICARE | Attending: Cardiovascular Disease | Primary: Family

## 2020-05-05 DIAGNOSIS — I4891 Unspecified atrial fibrillation: Secondary | ICD-10-CM

## 2020-05-05 MED ORDER — RIVAROXABAN 15 MG TAB
15 mg | ORAL_TABLET | Freq: Every day | ORAL | 5 refills | Status: DC
Start: 2020-05-05 — End: 2021-04-10

## 2020-05-05 NOTE — Addendum Note (Signed)
Addended by: Ellwood Sayers on: 05/05/2020 10:59 AM     Modules accepted: Orders

## 2020-05-05 NOTE — Progress Notes (Signed)
Verified patient with two patient identifiers.    Medications reviewed/approved by Dr. Hawkins.    1. Have you been to the ER, urgent care clinic since your last visit?  Hospitalized since your last visit?no    2. Have you seen or consulted any other health care providers outside of the [facility] since your last visit?  Include any pap smears or colon screening. Yes, pcp Burno, NP seen since l/v for r/c.

## 2020-05-05 NOTE — Progress Notes (Signed)
OFFICE hook up  HOLTER 48 hr monitor only.   Verified patient with two patient identifiers.    Patient verbalized understanding of its use.    Ordering MD- Gwen Her MD- Billy Fischer  Reason: New onset atrial fibrillation (HCC) [phone] (ICD-10-CM)]  Start time: 11:03AM    Patient has been successfully enrolled through [employer].      No LOS.

## 2020-05-05 NOTE — Addendum Note (Signed)
Addendum  Note by Ellwood Sayers, MD at 05/05/20 1000                Author: Ellwood Sayers, MD  Service: --  Author Type: Physician       Filed: 05/05/20 1059  Encounter Date: 05/05/2020  Status: Signed          Editor: Ellwood Sayers, MD (Physician)          Addended by: Ellwood Sayers on: 05/05/2020 10:59 AM    Modules accepted: Orders

## 2020-05-05 NOTE — Progress Notes (Signed)
OFFICE hook up  HOLTER 48 hr monitor only.   Verified patient with two patient identifiers.    Patient verbalized understanding of its use.    Ordering MD- HAWKINS, JOHN  READING MD- HAWKINS, JOHN  Reason: New onset atrial fibrillation (HCC) [I48.91 (ICD-10-CM)]  Start time: 11:03AM    Patient has been successfully enrolled through [employer].      No LOS.

## 2020-05-05 NOTE — Progress Notes (Signed)
Verified patient with two patient identifiers.    Medications reviewed/approved by Dr. Juanetta Gosling.    1. Have you been to the ER, urgent care clinic since your last visit?  Hospitalized since your last visit?no    2. Have you seen or consulted any other health care providers outside of the [facility] since your last visit?  Include any pap smears or colon screening. Yes, pcp Burno, NP seen since l/v for r/c.

## 2020-05-05 NOTE — Progress Notes (Signed)
Progress Notes by Reyne Dumas, MD at 05/05/20 1000                Author: Reyne Dumas, MD  Service: --  Author Type: Physician       Filed: 05/05/20 1059  Encounter Date: 05/05/2020  Status: Addendum          Editor: Reyne Dumas, MD (Physician)          Related Notes: Original Note by Reyne Dumas, MD (Physician) filed at 05/05/20 [place] is a  79 y.o. female is here for routine f/u. Hx hypertension, DM II, CKD III, obesity, COPD/asthma,  GERD, OSA followed by PCP, Nephrology and recent OV with Pulmonary.  Mild stable DOE, intermittent chest tightness.  Lexiscan MPI 06/09/19 normal perfusion, LVEF 59%, no ischemia.  Echo 02/18/19 with LVEF 55-60, borderline LVH, grade I diastolic, valves  ok, normal RVSP.  Some L hip and back pain--xrays 2/20 with DJD.Marland Kitchen   The patient denies chest pain/ shortness of breath, orthopnea, PND, LE edema, palpitations, syncope, presyncope or fatigue.           Patient Active Problem List           Diagnosis  Date Noted         ?  Severe obesity ([place])  02/10/2019     ?  CKD (chronic kidney disease)       ?  Abdominal pain  12/06/2014     ?  Weight loss  12/06/2014     ?  Ventral hernia  12/06/2014     ?  GERD (gastroesophageal reflux disease)  08/18/2014     ?  Obesity  08/18/2014     ?  Dyslipidemia  08/18/2014     ?  Anemia  08/18/2014     ?  TIA (transient ischemic attack)  08/18/2014     ?  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  HCVD (hypertensive cardiovascular disease)       ?  OSA (obstructive sleep apnea)       ?  COPD (chronic obstructive pulmonary disease) ([place])           ?  Diabetes ([place])  03/13/2013         Burno, Sharrell Ku, NP     Past Medical History:        Diagnosis  Date         ?  Anemia       ?  Asthma       ?  Change in bowel habits       ?  CKD (chronic kidney disease)       ?  COPD (chronic obstructive pulmonary disease) ([place])       ?  Depression       ?  DJD (degenerative  joint disease)       ?  GERD (gastroesophageal reflux disease)       ?  Glaucoma       ?  Gout       ?  HCVD (hypertensive cardiovascular disease)       ?  Obesity       ?  OSA (obstructive sleep apnea)       ?  Rectal bleed       ?  Ringing in ear           ?  TIA (transient ischemic attack)             Past Surgical History:         Procedure  Laterality  Date          ?  HX BREAST BIOPSY  Bilateral            neg          ?  HX BREAST REDUCTION         ?  HX CATARACT REMOVAL         ?  HX CHOLECYSTECTOMY    2016     ?  HX COLONOSCOPY    6.2014     ?  HX ENDOSCOPY    6.2014          chronic gastritis          ?  HX ENDOSCOPY    12/2014          gastritis          ?  HX HEART CATHETERIZATION    09/2009          EF 50%, Normal Coronaries          ?  HX HIP REPLACEMENT  Right            ?  HX HYSTERECTOMY              Allergies        Allergen  Reactions         ?  Nuts [Tree Nut]  Swelling     ?  Peanut  Swelling         ?  Simvastatin  Unable to Obtain           Family History         Problem  Relation  Age of Onset          ?  Hypertension  Mother       ?  Stroke  Mother       ?  Coronary Artery Disease  Sister       ?  Hypertension  Sister       ?  Diabetes  Sister       ?  Hypertension  Brother       ?  Diabetes  Brother       ?  Coronary Artery Disease  Brother       ?  Cancer  Brother                lung          ?  Hypertension  Brother       ?  Diabetes  Brother       ?  Cancer  Brother                prostate          ?  Diabetes  Sister             Social History          Socioeconomic History         ?  Marital status:  WIDOWED              Spouse name:  Not on file         ?  Number of  children:  Not on file     ?  Years of education:  Not on file     ?  Highest education level:  Not on file       Occupational History        ?  Not on file       Tobacco Use         ?  Smoking status:  Never Smoker     ?  Smokeless tobacco:  Never Used       Substance and Sexual Activity         ?  Alcohol use:  No     ?   Drug use:  Not on file     ?  Sexual activity:  Not on file        Other Topics  Concern        ?  Not on file       Social History Narrative        ?  Not on file          Social Determinants of Health          Financial Resource Strain:         ?  Difficulty of Paying Living Expenses:        Food Insecurity:         ?  Worried About Programme researcher, broadcasting/film/video in the Last Year:      ?  Barista in the Last Year:        Transportation Needs:         ?  Freight forwarder (Medical):      ?  Lack of Transportation (Non-Medical):        Physical Activity:         ?  Days of Exercise per Week:      ?  Minutes of Exercise per Session:        Stress:         ?  Feeling of Stress :        Social Connections:         ?  Frequency of Communication with Friends and Family:      ?  Frequency of Social Gatherings with Friends and Family:      ?  Attends Religious Services:      ?  Active Member of Clubs or Organizations:      ?  Attends Banker Meetings:      ?  Marital Status:        Intimate Partner Violence:         ?  Fear of Current or Ex-Partner:      ?  Emotionally Abused:      ?  Physically Abused:         ?  Sexually Abused:            Current Outpatient Medications        Medication  Sig         ?  lactulose (CHRONULAC) 10 gram/15 mL solution  Take  by mouth daily. 2.5 mL daily.     ?  fluticasone propionate (FLONASE NA)  by Nasal route two (2) times a day.     ?  pantoprazole sodium (PANTOPRAZOLE PO)  Take 40 mg by mouth daily.     ?  azelastine-fluticasone 137-50 mcg/spray spry  by Nasal route two (2) times a day.     ?  gabapentin (NEURONTIN) 100 mg capsule  Take  by mouth nightly.     ?  montelukast (SINGULAIR) 10 mg tablet  Take 10 mg by mouth nightly.     ?  tiotropium (SPIRIVA WITH HANDIHALER) 18 mcg inhalation capsule  Take 1 Cap by inhalation daily.     ?  aspirin delayed-release 81 mg tablet  Take  by mouth daily.     ?  dilTIAZem CD (CARDIZEM CD) 300 mg ER capsule  Take 300 mg by mouth  daily.     ?  sertraline (ZOLOFT) 50 mg tablet  Take  by mouth daily.     ?  spironolactone (ALDACTONE) 25 mg tablet  Take 25 mg by mouth two (2) times a day.         ?  atenolol (TENORMIN) 50 mg tablet  Take  by mouth daily.         ?  CALCIUM PO  Take  by mouth. 1200 DAILY     ?  OTHER  2,000 units daily.     ?  clopidogrel (PLAVIX) 75 mg tablet  Take  by mouth daily.     ?  albuterol (VENTOLIN HFA) 90 mcg/actuation inhaler  Take 2 Puffs by inhalation two (2) times a day.     ?  fluticasone propion-salmeteroL (Advair Diskus) 500-50 mcg/dose diskus inhaler  Take 1 Puff by inhalation two (2) times a day. Indications: Currently taking 500-3550mcg/dose diskus one puff twice a day     ?  ALLOPURINOL PO  Take 100 mg by mouth two (2) times a day.     ?  PRAVASTATIN SODIUM (PRAVASTATIN PO)  Take 40 mg by mouth daily.     ?  indapamide (LOZOL) 2.5 mg tablet  Take  by mouth daily. (Patient not taking: Reported on 05/05/2020)     ?  meclizine (ANTIVERT) 12.5 mg tablet  Take  by mouth three (3) times daily as needed for Dizziness. (Patient not taking: Reported on 05/05/2020)         ?  raNITIdine (ZANTAC) 150 mg tablet  Take 150 mg by mouth daily. (Patient not taking: Reported on 05/05/2020)          No current facility-administered medications for this visit.             Review of Symptoms:         CONST   No weight change. No fever, chills, sweats      ENT  No visual changes, URI sx, sore throat      CV   See HPI     RESP   No cough, or sputum, wheezing. Also see HPI     GI   No abdominal pain or change in bowel habits.   No heartburn or dysphagia.    No melena or rectal bleeding.      GU   No dysuria, urgency, frequency, hematuria     MSKEL   No joint pain, swelling.    No muscle pain.      SKIN   No rash or lesions.      NEURO   No headache, syncope, or seizure.    No weakness, loss of sensation, or paresthesias.      PSYCH   No low mood or depression   No anxiety.         HE/LYMPH   No easy bruising, abnormal bleeding, or  enlarged glands.  Physical ExamPhysical Exam:     Visit Vitals      BP  (!) 140/90 (BP 1 Location: Left upper arm, BP Patient Position: Sitting, BP Cuff Size: Large adult)     Pulse  70     Temp  98 ??F (36.7 ??C) (Temporal)     Resp  18     Ht  5\' 7"  (1.702 m)     Wt  238 lb (108 kg)         SpO2  97%  Comment: ra        BMI  37.28 kg/m??        Gen: NAD   HEENT:  PERRL, throat clear   Neck: no adenopathy, no thyromegaly, no JVD    Heart:  irregular,Nl S1S2, I/VI murmur, no gallop or rub.    Lungs:  clear   Abdomen:   Soft, non-tender, bowel sounds are active.    Extremities:  No edema   Pulse: symmetric   Neuro: A&O times 3, No focal neuro deficits      Cardiographics      ECG: afib (new onset), rate 91, NST           Assessment:               Patient Active Problem List           Diagnosis  Date Noted         ?  Severe obesity (HCC)  02/10/2019     ?  CKD (chronic kidney disease)       ?  Abdominal pain  12/06/2014     ?  Weight loss  12/06/2014     ?  Ventral hernia  12/06/2014     ?  GERD (gastroesophageal reflux disease)  08/18/2014     ?  Obesity  08/18/2014     ?  Dyslipidemia  08/18/2014     ?  Anemia  08/18/2014     ?  TIA (transient ischemic attack)  08/18/2014     ?  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  HCVD (hypertensive cardiovascular disease)       ?  OSA (obstructive sleep apnea)       ?  COPD (chronic obstructive pulmonary disease) (HCC)           ?  Diabetes (HCC)  03/13/2013         Hx hypertension, DM II, CKD III, obesity, COPD/asthma, GERD, OSA followed by PCP, Nephrology and recent OV with Pulmonary.  Mild stable DOE, intermittent chest tightness.   Lexiscan MPI 06/09/19 normal perfusion, LVEF 59%, no ischemia.  Echo 02/18/19 with LVEF 55-60, borderline LVH, grade I diastolic, valves ok, normal RVSP.  C/o L hip and back pain.    NEW ONSET AFIB noted on EKG today (no sx), rate controlled--?paroxysmal or persistent.  CHADs2-VASC 7 !!         Plan:        Holter monitor  x 48 hrs--new onset afib   Start OAC--Xarelto 15mg  given CKD--warfarin may another option d/t cost and can discuss with PCP.  CHADs2-VASC 7   Continue Plavix, other meds listed   Stop ASA   Echo/doppler   Continue current meds/rx   Call w/ results   RTC 6 mos, sooner prn      04/20/19, MD

## 2020-05-05 NOTE — Progress Notes (Addendum)
Michele Mejia is a 79 y.o. female is here for routine f/u. Hx hypertension, DM II, CKD III, obesity, COPD/asthma, GERD, OSA followed by PCP, Nephrology and recent OV with Pulmonary.  Mild stable DOE, intermittent chest tightness.  Lexiscan MPI 06/09/19 normal perfusion, LVEF 59%, no ischemia.  Echo 02/18/19 with LVEF 55-60, borderline LVH, grade I diastolic, valves ok, normal RVSP.  Some L hip and back pain--xrays 2/20 with DJD.Marland Kitchen   The patient denies chest pain/ shortness of breath, orthopnea, PND, LE edema, palpitations, syncope, presyncope or fatigue.       Patient Active Problem List    Diagnosis Date Noted   ??? Severe obesity (HCC) 02/10/2019   ??? CKD (chronic kidney disease)    ??? Abdominal pain 12/06/2014   ??? Weight loss 12/06/2014   ??? Ventral hernia 12/06/2014   ??? GERD (gastroesophageal reflux disease) 08/18/2014   ??? Obesity 08/18/2014   ??? Dyslipidemia 08/18/2014   ??? Anemia 08/18/2014   ??? TIA (transient ischemic attack) 08/18/2014   ??? 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 HCVD (hypertensive cardiovascular disease)    ??? OSA (obstructive sleep apnea)    ??? COPD (chronic obstructive pulmonary disease) (HCC)    ??? Diabetes (HCC) 03/13/2013      Burno, Chase Caller, NP  Past Medical History:   Diagnosis Date   ??? Anemia    ??? Asthma    ??? Change in bowel habits    ??? CKD (chronic kidney disease)    ??? COPD (chronic obstructive pulmonary disease) (HCC)    ??? Depression    ??? DJD (degenerative joint disease)    ??? GERD (gastroesophageal reflux disease)    ??? Glaucoma    ??? Gout    ??? HCVD (hypertensive cardiovascular disease)    ??? Obesity    ??? OSA (obstructive sleep apnea)    ??? Rectal bleed    ??? Ringing in ear    ??? TIA (transient ischemic attack)       Past Surgical History:   Procedure Laterality Date   ??? HX BREAST BIOPSY Bilateral     neg   ??? HX BREAST REDUCTION     ??? HX CATARACT REMOVAL     ??? HX CHOLECYSTECTOMY  2016   ??? HX COLONOSCOPY  6.2014   ??? HX ENDOSCOPY  6.2014    chronic gastritis   ??? HX  ENDOSCOPY  12/2014    gastritis   ??? HX HEART CATHETERIZATION  09/2009    EF 50%, Normal Coronaries   ??? HX HIP REPLACEMENT Right    ??? HX HYSTERECTOMY       Allergies   Allergen Reactions   ??? Nuts [Tree Nut] Swelling   ??? Peanut Swelling   ??? Simvastatin Unable to Obtain      Family History   Problem Relation Age of Onset   ??? Hypertension Mother    ??? Stroke Mother    ??? Coronary Artery Disease Sister    ??? Hypertension Sister    ??? Diabetes Sister    ??? Hypertension Brother    ??? Diabetes Brother    ??? Coronary Artery Disease Brother    ??? Cancer Brother         lung   ??? Hypertension Brother    ??? Diabetes Brother    ??? Cancer Brother         prostate   ??? Diabetes Sister       Social History  Socioeconomic History   ??? Marital status: WIDOWED     Spouse name: Not on file   ??? Number of children: Not on file   ??? Years of education: Not on file   ??? Highest education level: Not on file   Occupational History   ??? Not on file   Tobacco Use   ??? Smoking status: Never Smoker   ??? Smokeless tobacco: Never Used   Substance and Sexual Activity   ??? Alcohol use: No   ??? Drug use: Not on file   ??? Sexual activity: Not on file   Other Topics Concern   ??? Not on file   Social History Narrative   ??? Not on file     Social Determinants of Health     Financial Resource Strain:    ??? Difficulty of Paying Living Expenses:    Food Insecurity:    ??? Worried About Programme researcher, broadcasting/film/video in the Last Year:    ??? Barista in the Last Year:    Transportation Needs:    ??? Freight forwarder (Medical):    ??? Lack of Transportation (Non-Medical):    Physical Activity:    ??? Days of Exercise per Week:    ??? Minutes of Exercise per Session:    Stress:    ??? Feeling of Stress :    Social Connections:    ??? Frequency of Communication with Friends and Family:    ??? Frequency of Social Gatherings with Friends and Family:    ??? Attends Religious Services:    ??? Database administrator or Organizations:    ??? Attends Engineer, structural:    ??? Marital Status:     Intimate Programme researcher, broadcasting/film/video Violence:    ??? Fear of Current or Ex-Partner:    ??? Emotionally Abused:    ??? Physically Abused:    ??? Sexually Abused:       Current Outpatient Medications   Medication Sig   ??? lactulose (CHRONULAC) 10 gram/15 mL solution Take  by mouth daily. 2.5 mL daily.   ??? fluticasone propionate (FLONASE NA) by Nasal route two (2) times a day.   ??? pantoprazole sodium (PANTOPRAZOLE PO) Take 40 mg by mouth daily.   ??? azelastine-fluticasone 137-50 mcg/spray spry by Nasal route two (2) times a day.   ??? gabapentin (NEURONTIN) 100 mg capsule Take  by mouth nightly.   ??? montelukast (SINGULAIR) 10 mg tablet Take 10 mg by mouth nightly.   ??? tiotropium (SPIRIVA WITH HANDIHALER) 18 mcg inhalation capsule Take 1 Cap by inhalation daily.   ??? aspirin delayed-release 81 mg tablet Take  by mouth daily.   ??? dilTIAZem CD (CARDIZEM CD) 300 mg ER capsule Take 300 mg by mouth daily.   ??? sertraline (ZOLOFT) 50 mg tablet Take  by mouth daily.   ??? spironolactone (ALDACTONE) 25 mg tablet Take 25 mg by mouth two (2) times a day.   ??? atenolol (TENORMIN) 50 mg tablet Take  by mouth daily.   ??? CALCIUM PO Take  by mouth. 1200 DAILY   ??? OTHER 2,000 units daily.   ??? clopidogrel (PLAVIX) 75 mg tablet Take  by mouth daily.   ??? albuterol (VENTOLIN HFA) 90 mcg/actuation inhaler Take 2 Puffs by inhalation two (2) times a day.   ??? fluticasone propion-salmeteroL (Advair Diskus) 500-50 mcg/dose diskus inhaler Take 1 Puff by inhalation two (2) times a day. Indications: Currently taking 500-88mcg/dose diskus one puff twice a day   ???  ALLOPURINOL PO Take 100 mg by mouth two (2) times a day.   ??? PRAVASTATIN SODIUM (PRAVASTATIN PO) Take 40 mg by mouth daily.   ??? indapamide (LOZOL) 2.5 mg tablet Take  by mouth daily. (Patient not taking: Reported on 05/05/2020)   ??? meclizine (ANTIVERT) 12.5 mg tablet Take  by mouth three (3) times daily as needed for Dizziness. (Patient not taking: Reported on 05/05/2020)   ??? raNITIdine (ZANTAC) 150 mg tablet Take 150 mg by  mouth daily. (Patient not taking: Reported on 05/05/2020)     No current facility-administered medications for this visit.         Review of Symptoms:    CONST  No weight change. No fever, chills, sweats    ENT No visual changes, URI sx, sore throat    CV  See HPI   RESP  No cough, or sputum, wheezing. Also see HPI   GI  No abdominal pain or change in bowel habits.  No heartburn or dysphagia.   No melena or rectal bleeding.    GU  No dysuria, urgency, frequency, hematuria   MSKEL  No joint pain, swelling.   No muscle pain.    SKIN  No rash or lesions.    NEURO  No headache, syncope, or seizure.   No weakness, loss of sensation, or paresthesias.    PSYCH  No low mood or depression  No anxiety.    HE/LYMPH  No easy bruising, abnormal bleeding, or enlarged glands.        Physical ExamPhysical Exam:    Visit Vitals  BP (!) 140/90 (BP 1 Location: Left upper arm, BP Patient Position: Sitting, BP Cuff Size: Large adult)   Pulse 70   Temp 98 ??F (36.7 ??C) (Temporal)   Resp 18   Ht 5\' 7"  (1.702 m)   Wt 238 lb (108 kg)   SpO2 97% Comment: ra   BMI 37.28 kg/m??     Gen: NAD  HEENT:  PERRL, throat clear  Neck: no adenopathy, no thyromegaly, no JVD   Heart:  irregular,Nl S1S2, I/VI murmur, no gallop or rub.   Lungs:  clear  Abdomen:   Soft, non-tender, bowel sounds are active.   Extremities:  No edema  Pulse: symmetric  Neuro: A&O times 3, No focal neuro deficits    Cardiographics    ECG: afib (new onset), rate 91, NST      Assessment:         Patient Active Problem List    Diagnosis Date Noted   ??? Severe obesity ([place]) 02/10/2019   ??? CKD (chronic kidney disease)    ??? Abdominal pain 12/06/2014   ??? Weight loss 12/06/2014   ??? Ventral hernia 12/06/2014   ??? GERD (gastroesophageal reflux disease) 08/18/2014   ??? Obesity 08/18/2014   ??? Dyslipidemia 08/18/2014   ??? Anemia 08/18/2014   ??? TIA (transient ischemic attack) 08/18/2014   ??? 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 HCVD (hypertensive cardiovascular disease)    ??? OSA  (obstructive sleep apnea)    ??? COPD (chronic obstructive pulmonary disease) ([place])    ??? Diabetes ([place]) 03/13/2013      Hx hypertension, DM II, CKD III, obesity, COPD/asthma, GERD, OSA followed by PCP, Nephrology and recent OV with Pulmonary.  Mild stable DOE, intermittent chest tightness.  Lexiscan MPI 06/09/19 normal perfusion, LVEF 59%, no ischemia.  Echo 02/18/19 with LVEF 55-60, borderline LVH, grade I diastolic, valves ok, normal RVSP.  C/o L hip  and back pain.   NEW ONSET AFIB noted on EKG today (no sx), rate controlled--?paroxysmal or persistent.  CHADs2-VASC 7 !!     Plan:     Holter monitor x 48 hrs--new onset afib  Start OAC--Xarelto 15mg  given CKD--warfarin may another option d/t cost and can discuss with PCP.  CHADs2-VASC 7  Continue Plavix, other meds listed  Stop ASA  Echo/doppler  Continue current meds/rx  Call w/ results  RTC 6 mos, sooner prn    , MD

## 2020-05-13 ENCOUNTER — Inpatient Hospital Stay: Admit: 2020-05-13 | Payer: MEDICARE | Attending: Cardiovascular Disease | Primary: Family

## 2020-05-13 DIAGNOSIS — I340 Nonrheumatic mitral (valve) insufficiency: Secondary | ICD-10-CM

## 2020-05-13 LAB — ECHO (TTE) COMPLETE (PRN CONTRAST/BUBBLE/STRAIN/3D)
AV Peak Gradient: 12.91 mm[Hg]
AV Peak Velocity: 179.67 cm/s
Aortic Root: 3.81 cm
E/E' Septal: 5.42
Est. RA Pressure: 10 mm[Hg]
IVSd: 1.2 cm — AB (ref 0.6–0.9)
LA Area 4C: 16.96 cm2
LA Major Axis: 3.98 cm
LA Volume A-L A4C: 39.06 mL (ref 22–52)
LA Volume A-L A4C: 70.71 mL — AB (ref 22–52)
LA Volume BP: 60.59 mL (ref 22–52)
LV E' Septal Velocity: 9.37 cm/s
LV Mass 2D: 188.2 g (ref 67–162)
LVIDd: 4.44 cm (ref 3.9–5.3)
LVIDs: 3.14 cm
LVOT Diameter: 2.16 cm
LVPWd: 1.15 cm — AB (ref 0.6–0.9)
Left Ventricular Ejection Fraction: 58
MV A Velocity: 78.84 cm/s
MV E Velocity: 50.74 cm/s
MV E Wave Deceleration Time: 356.8 ms
MV E/A: 0.64
RVSP: 25.33 mm[Hg]
TR Max Velocity: 195.48 cm/s
TR Peak Gradient: 15.33 mm[Hg]

## 2020-05-13 LAB — ECHO ADULT COMPLETE
AV Peak Gradient: 12.91 mmHg
AV Peak Velocity: 179.67 cm/s
Aortic Root: 3.81 cm
E/E' Septal: 5.42
Est. RA Pressure: 10 mmHg
IVSd: 1.2 cm — AB (ref 0.60–0.90)
LA Area 4C: 16.96 cm2
LA Major Axis: 3.98 cm
LA Volume 2C: 70.71 mL — AB (ref 22.00–52.00)
LA Volume 4C: 39.06 mL (ref 22.00–52.00)
LA Volume BP: 60.59 mL (ref 22.0–52.0)
LV E' Septal Velocity: 9.37 cm/s
LV Mass 2D: 188.2 g (ref 67–162)
LVIDd: 4.44 cm (ref 3.90–5.30)
LVIDs: 3.14 cm
LVOT Diameter: 2.16 cm
LVPWd: 1.15 cm — AB (ref 0.60–0.90)
MV A Velocity: 78.84 cm/s
MV E Velocity: 50.74 cm/s
MV E Wave Deceleration Time: 356.8 ms
MV E/A: 0.64
RVSP: 25.33 mmHg
TR Max Velocity: 195.48 cm/s
TR Peak Gradient: 15.33 mmHg

## 2020-05-20 NOTE — Telephone Encounter (Signed)
 Telephone Encounter by Marc Asberry SQUIBB, LPN at 93/95/78 1335                Author: Marc Asberry SQUIBB, LPN  Service: --  Author Type: Licensed Nurse       Filed: 05/20/20 1341  Encounter Date: 05/20/2020  Status: Signed          Editor: Marc Asberry SQUIBB, LPN (Licensed Nurse)               Called pt informing of the following Holter results per Dr. Vonzell:      Holter   Received: Today   Vonzell Norleen ORN, MD    Marc Asberry SQUIBB, LPN   Advise atrial fibrillation about 30% of time, in and out of normal rhythm--she was in afib at time of office visit here. Should STAY on blood thinners (we started Xarelto  here). Fu with PCP and RTC here 6 mos sooner prn. Thanks [facility]       Pt verbalized understanding and stated she will call on Monday to schedule 74mo f/u.

## 2020-05-20 NOTE — Telephone Encounter (Signed)
Called pt informing of the following Holter results per Dr. Juanetta Gosling:    Holter  Received: Today  Ellwood Sayers, MD  Astrid Drafts, LPN  Advise atrial fibrillation about 30% of time, in and out of normal rhythm--she was in afib at time of office visit here. ??Should STAY on blood thinners ??(we started Xarelto here). ??Fu with PCP and RTC here 6 mos sooner prn. ??Thanks [facility]     Pt verbalized understanding and stated she will call on Monday to schedule 9mo f/u.

## 2020-05-24 NOTE — Telephone Encounter (Signed)
-----   Message from John W Hawkins, MD sent at 05/17/2020  7:20 AM EDT -----  Regarding: echo  Advise echo normal.  Thanks [facility]

## 2020-05-24 NOTE — Telephone Encounter (Signed)
Letter sent.

## 2020-05-25 NOTE — Telephone Encounter (Signed)
Pt was advised by her Ortho Dr that she will needing a hip replacement and that she should touch base with our office to find out if she will be needing an appt to get cardiac clearance for her surgery.  Please call [phone].

## 2020-05-26 NOTE — Telephone Encounter (Signed)
She is CLEAR from cardiac standpoint for planned Ortho sgy at low cardiac risk. She would need to hold anticoagulation for 48 hrs prior, restart. See my last office notes, Echo, Holter, and prior Stress MPI. Thanks [facility]     Spoke with the patient.  Verified patient with two patient identifiers.    Surgeons name given but pt is unsure of the phone number. States that the PA in located in [place].  Advised that the clearance will be sent to Cherene Julian, PA but they might have a specific form that needs to be completed.  Patient verbalized understanding.

## 2020-11-22 ENCOUNTER — Encounter: Attending: Cardiovascular Disease | Primary: Family

## 2020-12-08 ENCOUNTER — Ambulatory Visit: Attending: Cardiovascular Disease | Primary: Family

## 2020-12-08 ENCOUNTER — Ambulatory Visit: Admit: 2020-12-08 | Discharge: 2020-12-08 | Payer: MEDICARE | Attending: Cardiovascular Disease | Primary: Family

## 2020-12-08 DIAGNOSIS — I480 Paroxysmal atrial fibrillation: Secondary | ICD-10-CM

## 2020-12-08 NOTE — Progress Notes (Signed)
 Identified pt with two pt identifiers(name and DOB). Reviewed record in preparation for visit and have obtained necessary documentation.  Chief Complaint   Patient presents with   . Irregular Heart Beat     6 month follow up   . Hypertension   . Surgical Clearance     Orthopedic Hip surgery      Vitals:    12/08/20 0903   BP: 127/86   Pulse: 70   Resp: 16   Temp: 98.5 F (36.9 C)   TempSrc: Temporal   SpO2: 99%   Height: 5' 7 (1.702 m)   PainSc:  10 - Worst pain ever   PainLoc: Hip       Medications reviewed/approved by Dr. Vonzell.      Health Maintenance Review: Patient reminded of due or due soon health maintenance. I have asked the patient to contact his/her primary care provider (PCP) for follow-up on his/her health maintenance.    Coordination of Care Questionnaire:  :   1) Have you been to an emergency room, urgent care, or hospitalized since your last visit?  If yes, where when, and reason for visit? no       2. Have seen or consulted any other health care provider since your last visit?   If yes, where when, and reason for visit?  YES [facility] Decent, PA & Kennyth, MD for orthopedic.      Patient is accompanied by adult caretaker I have received verbal consent from Michele Mejia to discuss any/all medical information while they are present in the room.

## 2020-12-08 NOTE — Progress Notes (Signed)
Progress Notes by Ellwood Sayers, MD at 12/08/20 0900                Author: Ellwood Sayers, MD  Service: --  Author Type: Physician       Filed: 12/08/20 0934  Encounter Date: 12/08/2020  Status: Signed          Editor: Ellwood Sayers, MD (Physician)                                  Michele Mejia is a  79 y.o. female is here for routine f/u and pre-op clearance for planned hip sgy..  Last  seen 5/21 with new onset afib, rate controlled. (no sx).      Hx hypertension, DM II, CKD III, obesity, COPD/asthma, GERD, OSA followed by PCP, Nephrology and recent OV with Pulmonary. ??Mild stable DOE, intermittent chest tightness.  ??Lexiscan MPI 06/09/19 normal perfusion, LVEF 59%, no ischemia. ??Echo 02/18/19 with LVEF 55-60, borderline LVH, grade I diastolic, valves ok, normal RVSP.  C/o L hip and back pain.    NEW ONSET AFIB noted on EKG 05/05/20(no sx), rate controlled--?paroxysmal or persistent.  CHADs2-VASC 7 !!--Xarelto 15mg  started, ASA stopped (still on Plavix).      Holter monitor 6/21 with NSR, PAF 30% of time.      Echo 06/13/20:   ??  LV: Estimated LVEF is 55 - 60%. Visually measured ejection fraction. Normal cavity size, systolic function (ejection fraction normal)  and diastolic function. Mild concentric hypertrophy. Age-appropriate left ventricular diastolic function.   ??  AV: With no evidence of reduced excursion.   ??  TV: Pulmonary hypertension not suggested by Doppler findings.      The patient denies chest pain, orthopnea, PND, LE edema, palpitations, syncope, presyncope or fatigue.           Patient Active Problem List           Diagnosis  Date Noted         ?  Severe obesity (HCC)  02/10/2019     ?  CKD (chronic kidney disease)       ?  Abdominal pain  12/06/2014     ?  Weight loss  12/06/2014     ?  Ventral hernia  12/06/2014     ?  GERD (gastroesophageal reflux disease)  08/18/2014     ?  Obesity  08/18/2014     ?  Dyslipidemia  08/18/2014     ?  Anemia  08/18/2014     ?  TIA (transient  ischemic attack)  08/18/2014     ?  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  HCVD (hypertensive cardiovascular disease)       ?  OSA (obstructive sleep apnea)       ?  COPD (chronic obstructive pulmonary disease) (HCC)           ?  Diabetes (HCC)  03/13/2013         Burno, 03/15/2013, NP     Past Medical History:        Diagnosis  Date         ?  Anemia       ?  Asthma       ?  Change in bowel habits       ?  CKD (chronic kidney disease)       ?  COPD (chronic obstructive pulmonary disease) (HCC)       ?  Depression       ?  DJD (degenerative joint disease)       ?  GERD (gastroesophageal reflux disease)       ?  Glaucoma       ?  Gout       ?  HCVD (hypertensive cardiovascular disease)       ?  Obesity       ?  OSA (obstructive sleep apnea)       ?  Rectal bleed       ?  Ringing in ear           ?  TIA (transient ischemic attack)             Past Surgical History:         Procedure  Laterality  Date          ?  HX BREAST BIOPSY  Bilateral            neg          ?  HX BREAST REDUCTION         ?  HX CATARACT REMOVAL         ?  HX CHOLECYSTECTOMY    2016     ?  HX COLONOSCOPY    6.2014     ?  HX ENDOSCOPY    6.2014          chronic gastritis          ?  HX ENDOSCOPY    12/2014          gastritis          ?  HX HEART CATHETERIZATION    09/2009          EF 50%, Normal Coronaries          ?  HX HIP REPLACEMENT  Right            ?  HX HYSTERECTOMY              Allergies        Allergen  Reactions         ?  Nuts [Tree Nut]  Swelling     ?  Peanut  Swelling         ?  Simvastatin  Unable to Obtain           Family History         Problem  Relation  Age of Onset          ?  Hypertension  Mother       ?  Stroke  Mother       ?  Coronary Art Dis  Sister       ?  Hypertension  Sister       ?  Diabetes  Sister       ?  Hypertension  Brother       ?  Diabetes  Brother       ?  Coronary Art Dis  Brother       ?  Cancer  Brother                lung          ?  Hypertension  Brother       ?  Diabetes  Brother        ?  Cancer  Brother  prostate          ?  Diabetes  Sister             Social History          Socioeconomic History         ?  Marital status:  WIDOWED              Spouse name:  Not on file         ?  Number of children:  Not on file     ?  Years of education:  Not on file     ?  Highest education level:  Not on file       Occupational History        ?  Not on file       Tobacco Use         ?  Smoking status:  Never Smoker     ?  Smokeless tobacco:  Never Used       Substance and Sexual Activity         ?  Alcohol use:  No     ?  Drug use:  Not on file     ?  Sexual activity:  Not on file        Other Topics  Concern        ?  Not on file       Social History Narrative        ?  Not on file          Social Determinants of Health          Financial Resource Strain:         ?  Difficulty of Paying Living Expenses: Not on file       Food Insecurity:         ?  Worried About Running Out of Food in the Last Year: Not on file     ?  Ran Out of Food in the Last Year: Not on file       Transportation Needs:         ?  Lack of Transportation (Medical): Not on file     ?  Lack of Transportation (Non-Medical): Not on file       Physical Activity:         ?  Days of Exercise per Week: Not on file     ?  Minutes of Exercise per Session: Not on file       Stress:         ?  Feeling of Stress : Not on file       Social Connections:         ?  Frequency of Communication with Friends and Family: Not on file     ?  Frequency of Social Gatherings with Friends and Family: Not on file     ?  Attends Religious Services: Not on file     ?  Active Member of Clubs or Organizations: Not on file     ?  Attends Banker Meetings: Not on file     ?  Marital Status: Not on file       Intimate Partner Violence:         ?  Fear of Current or Ex-Partner: Not on file     ?  Emotionally Abused: Not on file     ?  Physically Abused: Not on  file     ?  Sexually Abused: Not on file       Housing Stability:         ?   Unable to Pay for Housing in the Last Year: Not on file     ?  Number of Places Lived in the Last Year: Not on file        ?  Unstable Housing in the Last Year: Not on file           Current Outpatient Medications        Medication  Sig         ?  lactulose (CHRONULAC) 10 gram/15 mL solution  Take  by mouth daily. 2.5 mL daily.     ?  rivaroxaban (Xarelto) 15 mg tab tablet  Take 1 Tablet by mouth daily (with dinner).         ?  fluticasone propionate (FLONASE NA)  by Nasal route two (2) times a day.         ?  indapamide (LOZOL) 2.5 mg tablet  Take  by mouth daily. (Patient not taking: Reported on 05/05/2020)     ?  pantoprazole sodium (PANTOPRAZOLE PO)  Take 40 mg by mouth daily.     ?  azelastine-fluticasone 137-50 mcg/spray spry  by Nasal route two (2) times a day.     ?  gabapentin (NEURONTIN) 100 mg capsule  Take  by mouth nightly.     ?  meclizine (ANTIVERT) 12.5 mg tablet  Take  by mouth three (3) times daily as needed for Dizziness. (Patient not taking: Reported on 05/05/2020)     ?  montelukast (SINGULAIR) 10 mg tablet  Take 10 mg by mouth nightly.     ?  raNITIdine (ZANTAC) 150 mg tablet  Take 150 mg by mouth daily. (Patient not taking: Reported on 05/05/2020)     ?  tiotropium (SPIRIVA WITH HANDIHALER) 18 mcg inhalation capsule  Take 1 Cap by inhalation daily.     ?  dilTIAZem CD (CARDIZEM CD) 300 mg ER capsule  Take 300 mg by mouth daily.     ?  sertraline (ZOLOFT) 50 mg tablet  Take  by mouth daily.     ?  spironolactone (ALDACTONE) 25 mg tablet  Take 25 mg by mouth two (2) times a day.     ?  atenolol (TENORMIN) 50 mg tablet  Take  by mouth daily.     ?  CALCIUM PO  Take  by mouth. 1200 DAILY     ?  OTHER  2,000 units daily.     ?  clopidogrel (PLAVIX) 75 mg tablet  Take  by mouth daily.     ?  albuterol (VENTOLIN HFA) 90 mcg/actuation inhaler  Take 2 Puffs by inhalation two (2) times a day.     ?  fluticasone propion-salmeteroL (Advair Diskus) 500-50 mcg/dose diskus inhaler  Take 1 Puff by inhalation two  (2) times a day. Indications: Currently taking 500-41mcg/dose diskus one puff twice a day     ?  ALLOPURINOL PO  Take 100 mg by mouth two (2) times a day.         ?  PRAVASTATIN SODIUM (PRAVASTATIN PO)  Take 40 mg by mouth daily.          No current facility-administered medications for this visit.             Review of Symptoms:  CONST   No weight change. No fever, chills, sweats      ENT  No visual changes, URI sx, sore throat      CV   See HPI     RESP   No cough, or sputum, wheezing. Also see HPI     GI   No abdominal pain or change in bowel habits.   No heartburn or dysphagia.    No melena or rectal bleeding.      GU   No dysuria, urgency, frequency, hematuria     MSKEL   No joint pain, swelling.    No muscle pain.      SKIN   No rash or lesions.      NEURO   No headache, syncope, or seizure.    No weakness, loss of sensation, or paresthesias.         PSYCH   No low mood or depression   No anxiety.         HE/LYMPH   No easy bruising, abnormal bleeding, or enlarged glands.            Physical ExamPhysical Exam:     Visit Vitals      Pulse  70     Temp  98.5 ??F (36.9 ??C) (Temporal)     Resp  16     Ht  5\' 7"  (1.702 m)         SpO2  99%  Comment: ra        BMI  37.28 kg/m??        Gen: NAD   HEENT:  PERRL, throat clear   Neck: no adenopathy, no thyromegaly, no JVD    Heart:  Regular,Nl S1S2,  no murmur, gallop or rub.    Lungs:  clear   Abdomen:   Soft, non-tender, bowel sounds are active.    Extremities:  No edema   Pulse: symmetric   Neuro: A&O times 3, No focal neuro deficits      Cardiographics      ECG: NSR 60, low voltage, PRWP, no acute changes           Assessment:               Patient Active Problem List           Diagnosis  Date Noted         ?  Severe obesity (HCC)  02/10/2019     ?  CKD (chronic kidney disease)       ?  Abdominal pain  12/06/2014     ?  Weight loss  12/06/2014     ?  Ventral hernia  12/06/2014     ?  GERD (gastroesophageal reflux disease)  08/18/2014     ?  Obesity  08/18/2014      ?  Dyslipidemia  08/18/2014     ?  Anemia  08/18/2014     ?  TIA (transient ischemic attack)  08/18/2014     ?  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  HCVD (hypertensive cardiovascular disease)       ?  OSA (obstructive sleep apnea)       ?  COPD (chronic obstructive pulmonary disease) (HCC)           ?  Diabetes (HCC)  03/13/2013        Here for routine f/u and pre-op clearance for planned hip sgy..  Last seen 5/21 with new  onset afib, rate controlled. (no sx).      Hx hypertension, DM II, CKD III, obesity, COPD/asthma, GERD, OSA followed by PCP, Nephrology and recent OV with Pulmonary. ??Mild stable DOE, intermittent chest tightness.  ??Lexiscan MPI 06/09/19 normal perfusion, LVEF 59%, no ischemia. ??Echo 02/18/19 with LVEF 55-60, borderline LVH, grade I diastolic, valves ok, normal RVSP.  C/o L hip and back pain.    NEW ONSET AFIB noted on EKG 05/05/20(no sx), rate controlled--?paroxysmal or persistent.  CHADs2-VASC 7 !!--Xarelto 15mg  started, ASA stopped (still on Plavix).      Holter monitor 6/21 with NSR, PAF 30% of time.      Echo 06/13/20:   ??  LV: Estimated LVEF is 55 - 60%. Visually measured ejection fraction. Normal cavity size, systolic function (ejection fraction normal)  and diastolic function. Mild concentric hypertrophy. Age-appropriate left ventricular diastolic function.   ??  AV: With no evidence of reduced excursion.   ??  TV: Pulmonary hypertension not suggested by Doppler findings.         Plan:        Doing well with no adverse cardiac symptoms. +   CLEAR for planned hip surgery at low cardiac risk--ok to hold Xarelto 48 hrs prior, restart after. Can hold Plavix 6-7 days prior   Lipids and labs followed by PCP.     Continue current care and f/u in 6 months.      06/15/20, MD

## 2021-04-10 MED ORDER — XARELTO 15 MG TABLET
15 mg | ORAL_TABLET | ORAL | 0 refills | Status: DC
Start: 2021-04-10 — End: 2021-08-16

## 2021-06-15 ENCOUNTER — Ambulatory Visit: Attending: Acute Care | Primary: Family

## 2021-06-15 ENCOUNTER — Ambulatory Visit: Admit: 2021-06-15 | Discharge: 2021-06-15 | Payer: MEDICARE | Attending: Acute Care | Primary: Family

## 2021-06-15 DIAGNOSIS — I480 Paroxysmal atrial fibrillation: Secondary | ICD-10-CM

## 2021-06-15 NOTE — Progress Notes (Signed)
 Identified pt with two pt identifiers(name and DOB). Reviewed record in preparation for visit and have obtained necessary documentation.  Chief Complaint   Patient presents with   . Irregular Heart Beat     6 month follow up   . Hypertension      Vitals:    06/15/21 0953   BP: 118/74   Pulse: (!) 57   Resp: 22   Temp: 97.5 F (36.4 C)   TempSrc: Temporal   SpO2: 98%   Height: 5' 7 (1.702 m)   PainSc:   6   PainLoc: Hip       Medications reviewed/approved by provider.      Health Maintenance Review: Patient reminded of due or due soon health maintenance. I have asked the patient to contact his/her primary care provider (PCP) for follow-up on his/her health maintenance.    Coordination of Care Questionnaire:  :   1) Have you been to an emergency room, urgent care, or hospitalized since your last visit?  If yes, where when, and reason for visit? no       2. Have seen or consulted any other health care provider since your last visit?   If yes, where when, and reason for visit?  YES Dr. Jillyn BEERS and Dr. Palmira as PCP      Patient is accompanied by nursing attendant I have received verbal consent from Michele Mejia to discuss any/all medical information while they are present in the room.Identified pt with two pt identifiers(name and DOB). Reviewed record in preparation for visit and have obtained necessary documentation.  Chief Complaint   Patient presents with   . Irregular Heart Beat     6 month follow up   . Hypertension      Vitals:    06/15/21 0953   BP: 118/74   Pulse: (!) 57   Resp: 22   Temp: 97.5 F (36.4 C)   TempSrc: Temporal   SpO2: 98%   Height: 5' 7 (1.702 m)   PainSc:   6   PainLoc: Hip       Medications reviewed/approved by provider.      Health Maintenance Review: Patient reminded of due or due soon health maintenance. I have asked the patient to contact his/her primary care provider (PCP) for follow-up on his/her health maintenance.    Coordination of Care Questionnaire:  :   1) Have  you been to an emergency room, urgent care, or hospitalized since your last visit?  If yes, where when, and reason for visit? no       2. Have seen or consulted any other health care provider since your last visit?   If yes, where when, and reason for visit?  YES      Patient is accompanied by self I have received verbal consent from Michele Mejia to discuss any/all medical information while they are present in the room.

## 2021-06-15 NOTE — Progress Notes (Signed)
Progress Notes by Ernie Hew., NP at 06/15/21 1000                Author: Ernie Hew., NP  Service: --  Author Type: Nurse Practitioner       Filed: 06/15/21 1028  Encounter Date: 06/15/2021  Status: Signed          Editor: Ernie Hew., NP (Nurse Practitioner)                                  Michele Mejia is a  80 y.o. female is here for routine f/u.  Hx hypertension, DM II, CKD III, obesity, COPD/asthma,  GERD, OSA followed by PCP, Nephrology and recent OV with Pulmonary. ??Mild stable DOE, intermittent chest tightness. ??Lexiscan MPI 06/09/19  normal perfusion, LVEF 59%, no ischemia. ??Echo 02/18/19 with LVEF 55-60, borderline LVH, grade I diastolic, valves ok, normal RVSP.  C/o L hip and back pain.    NEW ONSET AFIB noted on EKG 05/05/20(no sx), rate controlled--?paroxysmal or persistent.  CHADs2-VASC 7 !!--Xarelto 15mg  started, ASA stopped (still on Plavix).      Holter monitor 6/21 with NSR, PAF 30% of time.      Echo 06/13/20:   ??  LV: Estimated LVEF is 55 - 60%. Visually measured ejection fraction. Normal cavity size, systolic function (ejection fraction normal)  and diastolic function. Mild concentric hypertrophy. Age-appropriate left ventricular diastolic function.   ??  AV: With no evidence of reduced excursion.   ??  TV: Pulmonary hypertension not suggested by Doppler findings.       Last seen by Dr 06/15/20 11/2020:   and pre-op clearance for planned hip sgy..  Last seen 5/21 with new onset afib, rate controlled. (no sx).         Today,  States hip surgery was cancelled d/t low Hgb/Iron but normalized now.  She will see surgeon soon and hopefully   Arrange for surgery.  Left hip pain, not able to move much because of it.           The patient denies chest pain/ shortness of breath, orthopnea, PND,  palpitations, syncope, presyncope or fatigue.           Patient Active Problem List           Diagnosis  Date Noted         ?  PAF (paroxysmal atrial fibrillation) (HCC)  12/08/2020     ?   Severe obesity (HCC)  02/10/2019     ?  CKD (chronic kidney disease)       ?  Abdominal pain  12/06/2014     ?  Weight loss  12/06/2014     ?  Ventral hernia  12/06/2014     ?  GERD (gastroesophageal reflux disease)  08/18/2014     ?  Obesity  08/18/2014     ?  Dyslipidemia  08/18/2014     ?  Anemia  08/18/2014     ?  TIA (transient ischemic attack)  08/18/2014     ?  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  HCVD (hypertensive cardiovascular disease)       ?  OSA (obstructive sleep apnea)       ?  COPD (chronic obstructive pulmonary disease) (HCC)           ?  Diabetes (HCC)  03/13/2013  Burno, Chase CallerMontecia Boyd, NP     Past Medical History:        Diagnosis  Date         ?  Anemia       ?  Asthma       ?  Change in bowel habits       ?  CKD (chronic kidney disease)       ?  COPD (chronic obstructive pulmonary disease) (HCC)       ?  Depression       ?  DJD (degenerative joint disease)       ?  GERD (gastroesophageal reflux disease)       ?  Glaucoma       ?  Gout       ?  HCVD (hypertensive cardiovascular disease)       ?  Obesity       ?  OSA (obstructive sleep apnea)       ?  PAF (paroxysmal atrial fibrillation) (HCC)  12/08/2020     ?  Rectal bleed       ?  Ringing in ear           ?  TIA (transient ischemic attack)             Past Surgical History:         Procedure  Laterality  Date          ?  HX BREAST BIOPSY  Bilateral            neg          ?  HX BREAST REDUCTION         ?  HX CATARACT REMOVAL         ?  HX CHOLECYSTECTOMY    2016     ?  HX COLONOSCOPY    6.2014     ?  HX ENDOSCOPY    6.2014          chronic gastritis          ?  HX ENDOSCOPY    12/2014          gastritis          ?  HX HEART CATHETERIZATION    09/2009          EF 50%, Normal Coronaries          ?  HX HIP REPLACEMENT  Right            ?  HX HYSTERECTOMY              Allergies        Allergen  Reactions         ?  Nuts [Tree Nut]  Swelling     ?  Peanut  Swelling         ?  Simvastatin  Unable to Obtain            Family History         Problem  Relation  Age of Onset          ?  Hypertension  Mother       ?  Stroke  Mother       ?  Coronary Art Dis  Sister       ?  Hypertension  Sister       ?  Diabetes  Sister       ?  Hypertension  Brother       ?  Diabetes  Brother       ?  Coronary Art Dis  Brother       ?  Cancer  Brother                lung          ?  Hypertension  Brother       ?  Diabetes  Brother       ?  Cancer  Brother                prostate          ?  Diabetes  Sister             Social History          Socioeconomic History         ?  Marital status:  WIDOWED              Spouse name:  Not on file         ?  Number of children:  Not on file     ?  Years of education:  Not on file     ?  Highest education level:  Not on file       Occupational History        ?  Not on file       Tobacco Use         ?  Smoking status:  Never Smoker     ?  Smokeless tobacco:  Never Used       Substance and Sexual Activity         ?  Alcohol use:  No     ?  Drug use:  Not on file     ?  Sexual activity:  Not on file        Other Topics  Concern        ?  Not on file       Social History Narrative        ?  Not on file          Social Determinants of Health          Financial Resource Strain:         ?  Difficulty of Paying Living Expenses: Not on file       Food Insecurity:         ?  Worried About Running Out of Food in the Last Year: Not on file     ?  Ran Out of Food in the Last Year: Not on file       Transportation Needs:         ?  Lack of Transportation (Medical): Not on file     ?  Lack of Transportation (Non-Medical): Not on file       Physical Activity:         ?  Days of Exercise per Week: Not on file     ?  Minutes of Exercise per Session: Not on file       Stress:         ?  Feeling of Stress : Not on file       Social Connections:         ?  Frequency of Communication with Friends and Family: Not on file     ?  Frequency of Social Gatherings with Friends and Family: Not on file     ?  Attends Religious Services: Not  on file     ?  Active Member of Clubs or Organizations: Not on file     ?  Attends Banker Meetings: Not on file     ?  Marital Status: Not on file       Intimate Partner Violence:         ?  Fear of Current or Ex-Partner: Not on file     ?  Emotionally Abused: Not on file     ?  Physically Abused: Not on file     ?  Sexually Abused: Not on file       Housing Stability:         ?  Unable to Pay for Housing in the Last Year: Not on file     ?  Number of Places Lived in the Last Year: Not on file        ?  Unstable Housing in the Last Year: Not on file           Current Outpatient Medications        Medication  Sig         ?  Xarelto 15 mg tab tablet  TAKE 1 TABLET BY MOUTH EVERY DAY WITH DINNER     ?  HYDROcodone-acetaminophen (NORCO) 5-325 mg per tablet  Take 1 Tablet by mouth every eight (8) hours as needed for Pain.     ?  lactulose (CHRONULAC) 10 gram/15 mL solution  Take  by mouth daily. 2.5 mL daily.     ?  fluticasone propionate (FLONASE NA)  by Nasal route two (2) times a day.     ?  indapamide (LOZOL) 2.5 mg tablet  Take  by mouth daily.     ?  pantoprazole sodium (PANTOPRAZOLE PO)  Take 40 mg by mouth daily.     ?  azelastine-fluticasone 137-50 mcg/spray spry  by Nasal route two (2) times a day.     ?  gabapentin (NEURONTIN) 100 mg capsule  Take 100 mg by mouth three (3) times daily.     ?  montelukast (SINGULAIR) 10 mg tablet  Take 10 mg by mouth nightly.     ?  tiotropium (SPIRIVA WITH HANDIHALER) 18 mcg inhalation capsule  Take 1 Cap by inhalation daily.     ?  dilTIAZem CD (CARDIZEM CD) 300 mg ER capsule  Take 300 mg by mouth daily.     ?  sertraline (ZOLOFT) 50 mg tablet  Take  by mouth daily.     ?  spironolactone (ALDACTONE) 25 mg tablet  Take 25 mg by mouth two (2) times a day.     ?  atenolol (TENORMIN) 50 mg tablet  Take  by mouth daily.     ?  CALCIUM PO  Take  by mouth. 1200 DAILY     ?  OTHER  Vitamin d3 2,000 units daily.     ?  clopidogrel (PLAVIX) 75 mg tablet  Take  by mouth  daily.     ?  albuterol (VENTOLIN HFA) 90 mcg/actuation inhaler  Take 2 Puffs by inhalation two (2) times a day.     ?  fluticasone propion-salmeteroL (Advair Diskus) 500-50 mcg/dose diskus inhaler  Take 1 Puff by inhalation two (2) times a day. Indications: Currently taking 500-58mcg/dose diskus one puff twice a day     ?  ALLOPURINOL PO  Take 100 mg by mouth two (2) times a day.         ?  PRAVASTATIN SODIUM (PRAVASTATIN PO)  Take 40 mg by mouth daily.          No current facility-administered medications for this visit.             Review of Symptoms:         CONST   No weight change. No fever, chills, sweats      ENT  No visual changes, URI sx, sore throat      CV   See HPI     RESP   No cough, or sputum, wheezing. Also see HPI     GI   No abdominal pain or change in bowel habits.   No heartburn or dysphagia.    No melena or rectal bleeding.      GU   No dysuria, urgency, frequency, hematuria        MSKEL   No joint pain, swelling.    No muscle pain.         SKIN   No rash or lesions.      NEURO   No headache, syncope, or seizure.    No weakness, loss of sensation, or paresthesias.      PSYCH   No low mood or depression   No anxiety.         HE/LYMPH   No easy bruising, abnormal bleeding, or enlarged glands.            Physical ExamPhysical Exam:     There were no vitals taken for this visit.   Gen: NAD   HEENT:  PERRL, throat clear   Neck: no adenopathy, no thyromegaly, no JVD    Heart:  Regular,Nl S1S2,  no murmur, gallop or rub.    Lungs:  clear   Abdomen:   Soft, non-tender, bowel sounds are active.    Extremities: Dependent leg swelling L>R   Pulse: symmetric   Neuro: A&O times 3, No focal neuro deficits      Cardiographics      ECG: SB 57, low voltage, PRWP, no acute changes           Assessment:               Patient Active Problem List           Diagnosis  Date Noted         ?  PAF (paroxysmal atrial fibrillation) (HCC)  12/08/2020     ?  Severe obesity (HCC)  02/10/2019     ?  CKD (chronic kidney  disease)       ?  Abdominal pain  12/06/2014     ?  Weight loss  12/06/2014     ?  Ventral hernia  12/06/2014     ?  GERD (gastroesophageal reflux disease)  08/18/2014     ?  Obesity  08/18/2014     ?  Dyslipidemia  08/18/2014     ?  Anemia  08/18/2014     ?  TIA (transient ischemic attack)  08/18/2014     ?  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  HCVD (hypertensive cardiovascular disease)       ?  OSA (obstructive sleep apnea)       ?  COPD (chronic obstructive pulmonary disease) (HCC)           ?  Diabetes (HCC)  03/13/2013        Here for routine f/u and pre-op clearance for planned hip  sgy..  Last seen 5/21 with new onset afib, rate controlled. (no sx).      Hx hypertension, DM II, CKD III, obesity, COPD/asthma, GERD, OSA followed by PCP, Nephrology and recent OV with Pulmonary. ??Mild stable DOE, intermittent chest tightness.  ??Lexiscan MPI 06/09/19 normal perfusion, LVEF 59%, no ischemia. ??Echo 02/18/19 with LVEF 55-60, borderline LVH, grade I diastolic, valves ok, normal RVSP.  C/o L hip and back pain.    NEW ONSET AFIB noted on EKG 05/05/20(no sx), rate controlled--?paroxysmal or persistent.  CHADs2-VASC 7 !!--Xarelto  started, ASA stopped (still on Plavix).      Holter monitor 6/21 with NSR, PAF 30% of time.      Echo 06/13/20:   ??  LV: Estimated LVEF is 55 - 60%. Visually measured ejection fraction. Normal cavity size, systolic function (ejection fraction normal)  and diastolic function. Mild concentric hypertrophy. Age-appropriate left ventricular diastolic function.   ??  AV: With no evidence of reduced excursion.   ??  TV: Pulmonary hypertension not suggested by Doppler findings.         Plan:        PAF   Normal EF valves ok per echo 05/2020   Holter monitor 6/21 with NSR, PAF 30% of time.   Continue Diltiazem 300 mg daily   Continue Atenolol   Continue Xarelto 15 mg daily for CVA risk reduction   CHADs2-VASC 7 !!--Xarelto  started, ASA stopped (still on Plavix)   Hgb 11.3 ; Serum  Cr 1.04 in 05/2021      Lexiscan MPI 06/09/19 normal perfusion, LVEF 59%, no ischemia.      HTN   Controlled with current therapy      HLD   05/2021 LDL 79 On prava Labs and lipids per PCP      OSA  On PAP therapy      COPD   On inhalers      Hx TIA   On plavix, statin      Recall 11/2020   CLEAR for planned hip surgery at low cardiac risk--ok to hold Xarelto 48 hrs prior, restart after. Can hold Plavix 6-7 days prior   Lipids and labs followed by PCP             Continue current care and f/u in 6 months.      Ernie Hew, NP

## 2021-07-23 ENCOUNTER — Emergency Department: Admit: 2021-07-23 | Payer: MEDICARE | Primary: Family

## 2021-07-23 ENCOUNTER — Inpatient Hospital Stay
Admit: 2021-07-23 | Discharge: 2021-07-28 | Disposition: A | Discharge status: Other Acute Inpatient Hospital | Payer: MEDICARE | Attending: Internal Medicine | Admitting: Internal Medicine

## 2021-07-23 DIAGNOSIS — M25552 Pain in left hip: Principal | ICD-10-CM

## 2021-07-23 LAB — COMPREHENSIVE METABOLIC PANEL
ALT: 13 U/L (ref 12–78)
AST: 14 U/L — ABNORMAL LOW (ref 15–37)
Albumin/Globulin Ratio: 0.8 — ABNORMAL LOW (ref 1.1–2.2)
Albumin: 2.9 g/dL — ABNORMAL LOW (ref 3.5–5.0)
Alkaline Phosphatase: 81 U/L (ref 45–117)
Anion Gap: 8 mmol/L (ref 5–15)
BUN: 17 MG/DL (ref 6–20)
Bun/Cre Ratio: 13 (ref 12–20)
CO2: 26 mmol/L (ref 21–32)
Calcium: 8.5 MG/DL (ref 8.5–10.1)
Chloride: 104 mmol/L (ref 97–108)
Creatinine: 1.27 MG/DL — ABNORMAL HIGH (ref 0.55–1.02)
EGFR IF NonAfrican American: 41 mL/min/{1.73_m2} — ABNORMAL LOW (ref >60)
GFR African American: 49 mL/min/{1.73_m2} — ABNORMAL LOW (ref >60)
Globulin: 3.8 g/dL (ref 2.0–4.0)
Glucose: 184 mg/dL — ABNORMAL HIGH (ref 65–100)
Potassium: 4.3 mmol/L (ref 3.5–5.1)
Sodium: 138 mmol/L (ref 136–145)
Total Bilirubin: 0.4 MG/DL (ref 0.2–1.0)
Total Protein: 6.7 g/dL (ref 6.4–8.2)

## 2021-07-23 LAB — CBC WITH AUTO DIFFERENTIAL
Band Neutrophils: 3 %
Basophils %: 2 % — ABNORMAL HIGH (ref 0–1)
Basophils Absolute: 0.2 10*3/uL — ABNORMAL HIGH (ref 0.0–0.1)
Eosinophils %: 1 % (ref 0–7)
Eosinophils Absolute: 0.1 10*3/uL (ref 0.0–0.4)
Granulocyte Absolute Count: 0 10*3/uL (ref 0.00–0.04)
Hematocrit: 32.7 % — ABNORMAL LOW (ref 35.0–47.0)
Hemoglobin: 10.3 g/dL — ABNORMAL LOW (ref 11.5–16.0)
Immature Granulocytes: 0 % (ref 0.0–0.5)
Lymphocytes %: 5 % — ABNORMAL LOW (ref 12–49)
Lymphocytes Absolute: 0.4 10*3/uL — ABNORMAL LOW (ref 0.8–3.5)
MCH: 30.3 PG (ref 26.0–34.0)
MCHC: 31.5 g/dL (ref 30.0–36.5)
MCV: 96.2 FL (ref 80.0–99.0)
MPV: 9.4 FL (ref 8.9–12.9)
Monocytes %: 5 % (ref 5–13)
Monocytes Absolute: 0.4 10*3/uL (ref 0.0–1.0)
NRBC Absolute: 0 10*3/uL (ref 0.00–0.01)
Neutrophils %: 84 % — ABNORMAL HIGH (ref 32–75)
Neutrophils Absolute: 7.3 10*3/uL (ref 1.8–8.0)
Nucleated RBCs: 0 PER 100 WBC
Platelet Comment: ADEQUATE
Platelets: 197 10*3/uL (ref 150–400)
RBC: 3.4 M/uL — ABNORMAL LOW (ref 3.80–5.20)
RDW: 14.7 % — ABNORMAL HIGH (ref 11.5–14.5)
WBC: 8.4 10*3/uL (ref 3.6–11.0)

## 2021-07-23 LAB — COVID-19 WITH INFLUENZA A/B
Influenza A By PCR: NOT DETECTED
Influenza A by PCR: NOT DETECTED
Influenza B By PCR: NOT DETECTED
Influenza B by PCR: NOT DETECTED
SARS-CoV-2 by PCR: DETECTED — AB
SARS-CoV-2: DETECTED — AB

## 2021-07-23 LAB — PROTIME-INR
INR: 1.3 — ABNORMAL HIGH (ref 0.9–1.1)
Protime: 12.7 s — ABNORMAL HIGH (ref 9.0–11.1)

## 2021-07-23 LAB — CBC WITH AUTOMATED DIFF
ABS. BASOPHILS: 0.2 10*3/uL — ABNORMAL HIGH (ref 0.0–0.1)
ABS. EOSINOPHILS: 0.1 10*3/uL (ref 0.0–0.4)
ABS. IMM. GRANS.: 0 10*3/uL (ref 0.00–0.04)
ABS. LYMPHOCYTES: 0.4 10*3/uL — ABNORMAL LOW (ref 0.8–3.5)
ABS. MONOCYTES: 0.4 10*3/uL (ref 0.0–1.0)
ABS. NEUTROPHILS: 7.3 10*3/uL (ref 1.8–8.0)
ABSOLUTE NRBC: 0 10*3/uL (ref 0.00–0.01)
BAND NEUTROPHILS: 3 %
BASOPHILS: 2 % — ABNORMAL HIGH (ref 0–1)
EOSINOPHILS: 1 % (ref 0–7)
HCT: 32.7 % — ABNORMAL LOW (ref 35.0–47.0)
HGB: 10.3 g/dL — ABNORMAL LOW (ref 11.5–16.0)
IMMATURE GRANULOCYTES: 0 % (ref 0.0–0.5)
LYMPHOCYTES: 5 % — ABNORMAL LOW (ref 12–49)
MCH: 30.3 PG (ref 26.0–34.0)
MCHC: 31.5 g/dL (ref 30.0–36.5)
MCV: 96.2 FL (ref 80.0–99.0)
MONOCYTES: 5 % (ref 5–13)
MPV: 9.4 FL (ref 8.9–12.9)
NEUTROPHILS: 84 % — ABNORMAL HIGH (ref 32–75)
NRBC: 0 PER 100 WBC
PLATELET COMMENTS: ADEQUATE
PLATELET: 197 10*3/uL (ref 150–400)
RBC: 3.4 M/uL — ABNORMAL LOW (ref 3.80–5.20)
RDW: 14.7 % — ABNORMAL HIGH (ref 11.5–14.5)
WBC: 8.4 10*3/uL (ref 3.6–11.0)

## 2021-07-23 LAB — METABOLIC PANEL, COMPREHENSIVE
A-G Ratio: 0.8 — ABNORMAL LOW (ref 1.1–2.2)
ALT (SGPT): 13 U/L (ref 12–78)
AST (SGOT): 14 U/L — ABNORMAL LOW (ref 15–37)
Albumin: 2.9 g/dL — ABNORMAL LOW (ref 3.5–5.0)
Alk. phosphatase: 81 U/L (ref 45–117)
Anion gap: 8 mmol/L (ref 5–15)
BUN/Creatinine ratio: 13 (ref 12–20)
BUN: 17 MG/DL (ref 6–20)
Bilirubin, total: 0.4 MG/DL (ref 0.2–1.0)
CO2: 26 mmol/L (ref 21–32)
Calcium: 8.5 MG/DL (ref 8.5–10.1)
Chloride: 104 mmol/L (ref 97–108)
Creatinine: 1.27 MG/DL — ABNORMAL HIGH (ref 0.55–1.02)
GFR est AA: 49 mL/min/{1.73_m2} — ABNORMAL LOW (ref >60)
GFR est non-AA: 41 mL/min/{1.73_m2} — ABNORMAL LOW (ref >60)
Globulin: 3.8 g/dL (ref 2.0–4.0)
Glucose: 184 mg/dL — ABNORMAL HIGH (ref 65–100)
Potassium: 4.3 mmol/L (ref 3.5–5.1)
Protein, total: 6.7 g/dL (ref 6.4–8.2)
Sodium: 138 mmol/L (ref 136–145)

## 2021-07-23 LAB — PROTHROMBIN TIME + INR
INR: 1.3 — ABNORMAL HIGH (ref 0.9–1.1)
Prothrombin time: 12.7 s — ABNORMAL HIGH (ref 9.0–11.1)

## 2021-07-23 MED ORDER — MORPHINE 4 MG/ML INTRAVENOUS SOLUTION
4 mg/mL | INTRAVENOUS | Status: AC
Start: 2021-07-23 — End: 2021-07-23
  Administered 2021-07-23: via INTRAVENOUS

## 2021-07-23 MED ORDER — ONDANSETRON (PF) 4 MG/2 ML INJECTION
4 mg/2 mL | INTRAMUSCULAR | Status: AC
Start: 2021-07-23 — End: 2021-07-23
  Administered 2021-07-23: via INTRAVENOUS

## 2021-07-23 MED FILL — MORPHINE 4 MG/ML SYRINGE: 4 mg/mL | INTRAMUSCULAR | Qty: 1

## 2021-07-23 MED FILL — ONDANSETRON (PF) 4 MG/2 ML INJECTION: 4 mg/2 mL | INTRAMUSCULAR | Qty: 2

## 2021-07-23 NOTE — ED Notes (Signed)
Pure wick in place

## 2021-07-23 NOTE — ED Provider Notes (Signed)
ED Provider Notes by Morrie Sheldon, MD at 07/23/21 1741                Author: Morrie Sheldon, MD  Service: --  Author Type: Physician       Filed: 07/24/21 0722  Date of Service: 07/23/21 1741  Status: Signed          Editor: Morrie Sheldon, MD (Physician)               EMERGENCY DEPARTMENT HISTORY AND PHYSICAL EXAM               Date: 07/23/2021   Patient Name: Michele Mejia        History of Presenting Illness          Chief Complaint       Patient presents with        ?  Hip Pain             Trip and fall resulting in left hip pain and shortening. + pulses, PIV established by rescue who gave 4 mg zofran and 25 mcg of fentanyl PTA. Pt tested  positive for COVID today           History Provided By: Patient      HPI: Michele Mejia is a 80 y.o. female, pmhx listed below, who presents to the ED c/o fall.  Patient reports she fell today, slip and fall,  no LOC.  Reports she was unable to get off of the ground.  States she now has severe left hip pain with any range of motion.  Called EMS who brought patient to ER, gave patient fentanyl 25 mcgs prior to arrival.  Patient also received Zofran 4 mg prior  to arrival.      Patient also reports that she has been having a cough over the last 3 to 4 days.  Believed it was related to her COPD.  Took a rapid COVID test at home today and noted that she was positive.  No fever.  Reports some fatigue/general malaise.  Reports she  is not feeling short of breath at this time.            PCP: Sharlett Iles, NP      There are no other complaints, changes, or physical findings at this time.               Past History           Past Medical History:     Past Medical History:        Diagnosis  Date         ?  Anemia       ?  Asthma       ?  Change in bowel habits       ?  CKD (chronic kidney disease)       ?  COPD (chronic obstructive pulmonary disease) (HCC)       ?  Depression       ?  DJD (degenerative joint disease)       ?  GERD (gastroesophageal  reflux disease)       ?  Glaucoma       ?  Gout       ?  HCVD (hypertensive cardiovascular disease)       ?  Obesity       ?  OSA (obstructive sleep apnea)       ?  PAF (paroxysmal atrial fibrillation) (HCC)  12/08/2020     ?  Rectal bleed       ?  Ringing in ear           ?  TIA (transient ischemic attack)             Past Surgical History:     Past Surgical History:         Procedure  Laterality  Date          ?  HX BREAST BIOPSY  Bilateral            neg          ?  HX BREAST REDUCTION         ?  HX CATARACT REMOVAL         ?  HX CHOLECYSTECTOMY    2016     ?  HX COLONOSCOPY    6.2014     ?  HX ENDOSCOPY    6.2014          chronic gastritis          ?  HX ENDOSCOPY    12/2014          gastritis          ?  HX HEART CATHETERIZATION    09/2009          EF 50%, Normal Coronaries          ?  HX HIP REPLACEMENT  Right            ?  HX HYSTERECTOMY               Family History:     Family History         Problem  Relation  Age of Onset          ?  Hypertension  Mother       ?  Stroke  Mother       ?  Coronary Art Dis  Sister       ?  Hypertension  Sister       ?  Diabetes  Sister       ?  Hypertension  Brother       ?  Diabetes  Brother       ?  Coronary Art Dis  Brother       ?  Cancer  Brother                lung          ?  Hypertension  Brother       ?  Diabetes  Brother       ?  Cancer  Brother                prostate          ?  Diabetes  Sister             Social History:     Social History          Tobacco Use         ?  Smoking status:  Never     ?  Smokeless tobacco:  Never       Substance Use Topics         ?  Alcohol use:  No             Current Facility-Administered Medications  Medication  Dose  Route  Frequency  Provider  Last Rate  Last Admin              ?  ondansetron (ZOFRAN ODT) tablet 4 mg   4 mg  Oral  Q6H PRN  Claris Gladden, [place], MD           ?  gabapentin (NEURONTIN) capsule 100 mg   100 mg  Oral  TID  Birdie Riddle, MD     100 mg at 07/23/21 2357     ?  allopurinoL (ZYLOPRIM) tablet 100  mg   100 mg  Oral  BID  Birdie Riddle, MD           ?  fluticasone-salmeterol (ADVAIR/WIXELA) 222mcg-50mcg/puff   1 Puff  Inhalation  BID RT  Claris Gladden, [place], MD           ?  HYDROcodone-acetaminophen [facility]) 5-325 mg per tablet 1 Tablet   1 Tablet  Oral  Q6H PRN  Birdie Riddle, MD     1 Tablet at 07/24/21 0540              ?  dilTIAZem ER (CARDIZEM CD) capsule 300 mg   300 mg  Oral  DAILY  Birdie Riddle, MD                 Allergies:     Allergies        Allergen  Reactions         ?  Nuts [Tree Nut]  Swelling     ?  Peanut  Swelling         ?  Simvastatin  Unable to Obtain                Review of Systems     Review of Systems    Constitutional:  Positive for fatigue. Negative for chills and fever.    HENT:  Negative for congestion.     Eyes:  Negative for pain.    Respiratory:  Positive for cough. Negative for shortness of breath.     Cardiovascular:  Negative for chest pain.    Gastrointestinal:  Negative for abdominal pain.    Genitourinary:  Negative for flank pain.    Musculoskeletal:  Negative for back pain.    Neurological:  Negative for headaches.    Psychiatric/Behavioral:  Negative for agitation.          Physical Exam        Vital Signs-Reviewed the patient's vital signs.   Patient Vitals for the past 12 hrs:            Temp  Pulse  Resp  BP  SpO2            07/24/21 0515  99.8 ??F (37.7 ??C)  87  20  (!) 147/77  96 %            07/23/21 2239  99.7 ??F (37.6 ??C)  81  20  (!) 155/74  98 %     07/23/21 2207  99 ??F (37.2 ??C)  --  --  (!) 149/82  92 %     07/23/21 2134  --  --  --  (!) 152/89  95 %     07/23/21 2107  --  --  --  (!) 144/62  98 %     07/23/21 2027  --  --  --  (!) 146/75  92 %  07/23/21 2009  --  --  --  (!) 151/77  95 %           Physical Exam   Constitutional :        Appearance: Normal appearance.    HENT:       Head: Normocephalic and atraumatic.       Mouth/Throat:       Mouth: Mucous membranes are moist.    Eyes:       Pupils: Pupils are equal, round, and reactive to light.      Cardiovascular:       Rate and Rhythm: Normal rate and regular rhythm.    Pulmonary:       Effort: Pulmonary effort is normal.       Breath sounds: Normal breath sounds.     Abdominal:       Tenderness: There is no abdominal tenderness.     Musculoskeletal:          General: No swelling.       Comments: Left lower extremity propped on pillow at knee.  Severe pain with minimal range of motion of hip.  Normal DP pulses bilaterally.  Normal distal sensation.     Skin:      General: Skin is warm and dry.    Neurological:       Mental Status: She is alert and oriented to person, place, and time.       Sensory: No sensory deficit.    Psychiatric:          Mood and Affect: Mood normal.            Diagnostic Study Results        Labs -         Recent Results (from the past 12 hour(s))     URINALYSIS W/ RFLX MICROSCOPIC          Collection Time: 07/24/21  1:37 AM         Result  Value  Ref Range            Color  YELLOW/STRAW          Appearance  CLEAR  CLEAR         Specific gravity  1.015  1.003 - 1.030         pH (UA)  6.0  5.0 - 8.0         Protein  Negative  NEG mg/dL       Glucose  Negative  NEG mg/dL       Ketone  Negative  NEG mg/dL       Bilirubin  Negative  NEG         Blood  Negative  NEG         Urobilinogen  0.2  0.2 - 1.0 EU/dL       Nitrites  Negative  NEG              Leukocyte Esterase  Negative  NEG             Radiologic Studies -      CT HIP LT WO CONT                   XR HIP LT W OR WO PELV 2-3 VWS       Final Result     Deformity and flattening of the left femoral head against the     acetabulum. No femoral neck or femoral shaft fracture.  Right hip arthroplasty.                 XR FEMUR LT 2 V       Final Result     Deformity and flattening of the left femoral head against the     acetabulum. No femoral neck or femoral shaft fracture.     Right hip arthroplasty.                 XR CHEST SNGL V       Final Result     No acute cardiopulmonary abnormality.                      CT Results  (Last 48  hours)                                       07/23/21 1936    CT HIP LT WO CONT  Preliminary result                This result has not been signed. Information might be incomplete.                       Narrative:    Preliminary report:      There are severe degenerative changes of the left hip joint, with subchondral      sclerosis, joint space narrowing, osteophyte formation. There is no evidence of      acute fracture. The acetabulum, image left hemipelvis, proximal femur are      intact.                               CXR Results  (Last 48 hours)                                       07/23/21 1850    XR CHEST SNGL V  Final result            Impression:    No acute cardiopulmonary abnormality.                              Narrative:    EXAM:  XR CHEST SNGL V             INDICATION:  COVID 19             COMPARISON: March 2014.             TECHNIQUE: AP portable chest radiograph.             FINDINGS: The lungs are clear. Heart size and mediastinal contours are normal.      No pleural effusion or pneumothorax. Bones are age-appropriate.                                            Medical Decision Making     I am the first provider for this patient.      I reviewed the vital signs, available nursing notes, past medical history, past surgical history, family history and social history.  Records Reviewed: Nursing Notes and Old Medical Records      Provider Notes (Medical Decision Making):    MDM: 80 y.o. F with fall concerning for hip fracture. X-rays ordered.  Normal O2 sat and not in distress - COVID symptoms started approx 3-4 days ago.        Initial assessment performed. The patients presenting problems have been discussed, and they are in agreement with the care plan formulated and outlined with them.  I have encouraged them to ask questions  as they arise throughout their visit.      PROGRESS NOTE:     ED Course as of 07/24/21 0720       Wynelle Link Jul 23, 2021        0160  Case signed out to Dr. Claris Gladden pending x-ray  results.  Patient is + for COVID. [PV]     2018  Pt has no fracture of the hip but severe OA. She is supposed to get a THR at [facility] after she gets her iron infusion. She lives by herself; during the day  she has an aid. Paging hospitalist now. [VG]        2130  Case discussed with Dr. Lupita Leash, who agrees with admitting to Obs. Pt wants to be full code. Will give Norco as needed for hip pain. Will write for Paxlovid  if possible. [VG]              ED Course User Index   [PV] Morrie Sheldon, MD   [VG] Birdie Riddle, MD              Diagnosis        Clinical Impression:       1.  Osteoarthritis of left hip, unspecified osteoarthritis type      2.  COVID-19         3.  Fall, initial encounter                  Disposition:   Admitted        Current Discharge Medication List                       Please note, this dictation was completed with Dragon, the computer voice recognition software. Quite often unanticipated grammatical, syntax, homophones,  and other interpretive errors are inadvertently transcribed by the computer software. Please disregard these errors. Please excuse any errors that have escaped final proof reading.

## 2021-07-23 NOTE — Progress Notes (Signed)
Remote Hospitalist ED sign out/admission acceptance    Chief complaint:Hip pain      Reason For admission:  Status post fall left hip pain. No fracture on x-ray or CT of the Pelvis.  Admission for pain management/PTOT   Admission Status:Observation      ED Physician giving sign out: Dr. Baker Janus      Patient Vitals for the past 12 hrs:   Temp Pulse Resp BP SpO2   07/24/21 0515 99.8 ??F (37.7 ??C) 87 20 (!) 147/77 96 %   07/23/21 2239 99.7 ??F (37.6 ??C) 81 20 (!) 155/74 98 %   07/23/21 2207 99 ??F (37.2 ??C) -- -- (!) 149/82 92 %   07/23/21 2134 -- -- -- (!) 152/89 95 %   07/23/21 2107 -- -- -- (!) 144/62 98 %   07/23/21 2027 -- -- -- (!) 146/75 92 %   07/23/21 2009 -- -- -- (!) 151/77 95 %         Recent Results (from the past 24 hour(s))   CBC WITH AUTOMATED DIFF    Collection Time: 07/23/21  5:43 PM   Result Value Ref Range    WBC 8.4 3.6 - 11.0 K/uL    RBC 3.40 (L) 3.80 - 5.20 M/uL    HGB 10.3 (L) 11.5 - 16.0 g/dL    HCT 32.7 (L) 35.0 - 47.0 %    MCV 96.2 80.0 - 99.0 FL    MCH 30.3 26.0 - 34.0 PG    MCHC 31.5 30.0 - 36.5 g/dL    RDW 14.7 (H) 11.5 - 14.5 %    PLATELET 197 150 - 400 K/uL    MPV 9.4 8.9 - 12.9 FL    NRBC 0.0 0 PER 100 WBC    ABSOLUTE NRBC 0.00 0.00 - 0.01 K/uL    NEUTROPHILS 84 (H) 32 - 75 %    BAND NEUTROPHILS 3 %    LYMPHOCYTES 5 (L) 12 - 49 %    MONOCYTES 5 5 - 13 %    EOSINOPHILS 1 0 - 7 %    BASOPHILS 2 (H) 0 - 1 %    IMMATURE GRANULOCYTES 0 0.0 - 0.5 %    ABS. NEUTROPHILS 7.3 1.8 - 8.0 K/UL    ABS. LYMPHOCYTES 0.4 (L) 0.8 - 3.5 K/UL    ABS. MONOCYTES 0.4 0.0 - 1.0 K/UL    ABS. EOSINOPHILS 0.1 0.0 - 0.4 K/UL    ABS. BASOPHILS 0.2 (H) 0.0 - 0.1 K/UL    ABS. IMM. GRANS. 0.0 0.00 - 0.04 K/UL    DF MANUAL      PLATELET COMMENTS ADEQUATE PLATELETS      RBC COMMENTS TEARDROP CELLS  1+       METABOLIC PANEL, COMPREHENSIVE    Collection Time: 07/23/21  5:43 PM   Result Value Ref Range    Sodium 138 136 - 145 mmol/L    Potassium 4.3 3.5 - 5.1 mmol/L    Chloride 104 97 - 108 mmol/L    CO2 26 21 - 32 mmol/L     Anion gap 8 5 - 15 mmol/L    Glucose 184 (H) 65 - 100 mg/dL    BUN 17 6 - 20 MG/DL    Creatinine 1.27 (H) 0.55 - 1.02 MG/DL    BUN/Creatinine ratio 13 12 - 20      GFR est AA 49 (L) >60 ml/min/1.66m    GFR est non-AA 41 (L) >60 ml/min/1.779m   Calcium 8.5  8.5 - 10.1 MG/DL    Bilirubin, total 0.4 0.2 - 1.0 MG/DL    ALT (SGPT) 13 12 - 78 U/L    AST (SGOT) 14 (L) 15 - 37 U/L    Alk. phosphatase 81 45 - 117 U/L    Protein, total 6.7 6.4 - 8.2 g/dL    Albumin 2.9 (L) 3.5 - 5.0 g/dL    Globulin 3.8 2.0 - 4.0 g/dL    A-G Ratio 0.8 (L) 1.1 - 2.2     PROTHROMBIN TIME + INR    Collection Time: 07/23/21  5:43 PM   Result Value Ref Range    INR 1.3 (H) 0.9 - 1.1      Prothrombin time 12.7 (H) 9.0 - 11.1 sec   COVID-19 WITH INFLUENZA A/B    Collection Time: 07/23/21  5:53 PM   Result Value Ref Range    SARS-CoV-2 by PCR Detected (A) NOTD      Influenza A by PCR Not detected NOTD      Influenza B by PCR Not detected NOTD     URINALYSIS W/ RFLX MICROSCOPIC    Collection Time: 07/24/21  1:37 AM   Result Value Ref Range    Color YELLOW/STRAW      Appearance CLEAR CLEAR      Specific gravity 1.015 1.003 - 1.030      pH (UA) 6.0 5.0 - 8.0      Protein Negative NEG mg/dL    Glucose Negative NEG mg/dL    Ketone Negative NEG mg/dL    Bilirubin Negative NEG      Blood Negative NEG      Urobilinogen 0.2 0.2 - 1.0 EU/dL    Nitrites Negative NEG      Leukocyte Esterase Negative NEG           CT HIP LT WO CONT         XR HIP LT W OR WO PELV 2-3 VWS   Final Result   Deformity and flattening of the left femoral head against the   acetabulum. No femoral neck or femoral shaft fracture.   Right hip arthroplasty.         XR FEMUR LT 2 V   Final Result   Deformity and flattening of the left femoral head against the   acetabulum. No femoral neck or femoral shaft fracture.   Right hip arthroplasty.         XR CHEST SNGL V   Final Result   No acute cardiopulmonary abnormality.                 No results found for this or any previous visit.

## 2021-07-23 NOTE — ED Notes (Signed)
Report given to  [facility], all questions answered. Patient transported to IP, condition unchanged. NAD noted when leaving department.

## 2021-07-23 NOTE — ED Notes (Signed)
Pt reports she was exposed to Covid and this AM she self tested and is positive.  pt reports "vaccinated" for Covid.

## 2021-07-23 NOTE — ED Notes (Signed)
Phlebotomy  at beside

## 2021-07-23 NOTE — ED Notes (Signed)
Lab at bedside

## 2021-07-23 NOTE — ED Notes (Signed)
Pt placed on slide board per CT's request,  pt awaits radiology tech

## 2021-07-24 LAB — HEMOGLOBIN A1C W/EAG
Hemoglobin A1C: 7.3 % — ABNORMAL HIGH (ref 4.0–5.6)
eAG: 163 mg/dL

## 2021-07-24 LAB — URINALYSIS W/ RFLX MICROSCOPIC
Bilirubin, Urine: NEGATIVE
Bilirubin: NEGATIVE
Blood, Urine: NEGATIVE
Blood: NEGATIVE
Glucose, Ur: NEGATIVE mg/dL
Glucose: NEGATIVE mg/dL
Ketone: NEGATIVE mg/dL
Ketones, Urine: NEGATIVE mg/dL
Leukocyte Esterase, Urine: NEGATIVE
Leukocyte Esterase: NEGATIVE
Nitrite, Urine: NEGATIVE
Nitrites: NEGATIVE
Protein, UA: NEGATIVE mg/dL
Protein: NEGATIVE mg/dL
Specific Gravity, UA: 1.015 (ref 1.003–1.030)
Specific gravity: 1.015 (ref 1.003–1.030)
Urobilinogen, UA, POCT: 0.2 EU/dL (ref 0.2–1.0)
Urobilinogen: 0.2 EU/dL (ref 0.2–1.0)
pH (UA): 6 (ref 5.0–8.0)
pH, UA: 6 (ref 5.0–8.0)

## 2021-07-24 LAB — EKG 12-LEAD
Atrial Rate: 88 {beats}/min
Diagnosis: NORMAL
P Axis: 55 degrees
P-R Interval: 176 ms
Q-T Interval: 356 ms
QRS Duration: 72 ms
QTc Calculation (Bazett): 430 ms
R Axis: -15 degrees
T Axis: 36 degrees
Ventricular Rate: 88 {beats}/min

## 2021-07-24 LAB — MAGNESIUM
Magnesium: 1.5 mg/dL — ABNORMAL LOW (ref 1.6–2.4)
Magnesium: 1.5 mg/dL — ABNORMAL LOW (ref 1.6–2.4)

## 2021-07-24 LAB — PHOSPHORUS
Phosphorus: 2.8 MG/DL (ref 2.6–4.7)
Phosphorus: 2.8 MG/DL (ref 2.6–4.7)

## 2021-07-24 LAB — EKG, 12 LEAD, INITIAL
Atrial Rate: 88 {beats}/min
Calculated P Axis: 55 degrees
Calculated R Axis: -15 degrees
Calculated T Axis: 36 degrees
Diagnosis: NORMAL
P-R Interval: 176 ms
Q-T Interval: 356 ms
QRS Duration: 72 ms
QTC Calculation (Bezet): 430 ms
Ventricular Rate: 88 {beats}/min

## 2021-07-24 LAB — HEMOGLOBIN A1C WITH EAG
Est. average glucose: 163 mg/dL
Hemoglobin A1c: 7.3 % — ABNORMAL HIGH (ref 4.0–5.6)

## 2021-07-24 MED ORDER — CLOPIDOGREL 75 MG TAB
75 mg | Freq: Every day | ORAL | Status: DC
Start: 2021-07-24 — End: 2021-07-28
  Administered 2021-07-25 – 2021-07-28 (×4): via ORAL

## 2021-07-24 MED ORDER — GABAPENTIN 100 MG CAP
100 mg | Freq: Three times a day (TID) | ORAL | Status: DC
Start: 2021-07-24 — End: 2021-07-28
  Administered 2021-07-24 – 2021-07-28 (×15): via ORAL

## 2021-07-24 MED ORDER — GLUCOSE 4 GRAM CHEWABLE TAB
4 gram | ORAL | Status: DC | PRN
Start: 2021-07-24 — End: 2021-07-28

## 2021-07-24 MED ORDER — GABAPENTIN 100 MG CAP
100 mg | Freq: Three times a day (TID) | ORAL | Status: DC
Start: 2021-07-24 — End: 2021-07-24

## 2021-07-24 MED ORDER — FLUTICASONE-SALMETEROL 250 MCG-50 MCG/DOSE DISK DEVICE FOR INHALATION
250-50 mcg/dose | Freq: Two times a day (BID) | RESPIRATORY_TRACT | Status: DC
Start: 2021-07-24 — End: 2021-07-24
  Administered 2021-07-24: 13:00:00 via RESPIRATORY_TRACT

## 2021-07-24 MED ORDER — RIVAROXABAN 15 MG TAB
15 mg | Freq: Every day | ORAL | Status: DC
Start: 2021-07-24 — End: 2021-07-28
  Administered 2021-07-25 – 2021-07-28 (×4): via ORAL

## 2021-07-24 MED ORDER — MAGNESIUM SULFATE 2 GRAM/50 ML IVPB
2 gram/50 mL (4 %) | Freq: Once | INTRAVENOUS | Status: AC
Start: 2021-07-24 — End: 2021-07-24
  Administered 2021-07-25: via INTRAVENOUS

## 2021-07-24 MED ORDER — GLUCAGON 1 MG INJECTION
1 mg | INTRAMUSCULAR | Status: DC | PRN
Start: 2021-07-24 — End: 2021-07-28

## 2021-07-24 MED ORDER — HYDROCODONE-ACETAMINOPHEN 5 MG-325 MG TAB
5-325 mg | ORAL | Status: AC
Start: 2021-07-24 — End: 2021-07-23
  Administered 2021-07-24: 02:00:00 via ORAL

## 2021-07-24 MED ORDER — DILTIAZEM ER 180 MG 24 HR CAP
180 mg | Freq: Every day | ORAL | Status: DC
Start: 2021-07-24 — End: 2021-07-28
  Administered 2021-07-24 – 2021-07-28 (×6): via ORAL

## 2021-07-24 MED ORDER — ALLOPURINOL 100 MG TAB
100 mg | Freq: Two times a day (BID) | ORAL | Status: DC
Start: 2021-07-24 — End: 2021-07-24

## 2021-07-24 MED ORDER — ENOXAPARIN 30 MG/0.3 ML SUB-Q SYRINGE
30 mg/0.3 mL | Freq: Two times a day (BID) | SUBCUTANEOUS | Status: DC
Start: 2021-07-24 — End: 2021-07-28
  Administered 2021-07-24 – 2021-07-28 (×10): via SUBCUTANEOUS

## 2021-07-24 MED ORDER — TIOTROPIUM BROMIDE 18 MCG CAPS WITH INHALATION DEVICE
18 mcg | Freq: Every day | RESPIRATORY_TRACT | Status: DC
Start: 2021-07-24 — End: 2021-07-28
  Administered 2021-07-25 – 2021-07-28 (×4): via RESPIRATORY_TRACT

## 2021-07-24 MED ORDER — SERTRALINE 50 MG TAB
50 mg | Freq: Every day | ORAL | Status: DC
Start: 2021-07-24 — End: 2021-07-28
  Administered 2021-07-25 – 2021-07-28 (×4): via ORAL

## 2021-07-24 MED ORDER — ALBUTEROL SULFATE HFA 90 MCG/ACTUATION AEROSOL INHALER
90 mcg/actuation | Freq: Four times a day (QID) | RESPIRATORY_TRACT | Status: DC | PRN
Start: 2021-07-24 — End: 2021-07-28
  Administered 2021-07-26 – 2021-07-27 (×2): via RESPIRATORY_TRACT

## 2021-07-24 MED ORDER — ATENOLOL 25 MG TAB
25 mg | Freq: Every day | ORAL | Status: DC
Start: 2021-07-24 — End: 2021-07-28
  Administered 2021-07-25 – 2021-07-28 (×4): via ORAL

## 2021-07-24 MED ORDER — DILTIAZEM ER 120 MG 24 HR CAP
120 mg | Freq: Every day | ORAL | Status: DC
Start: 2021-07-24 — End: 2021-07-24

## 2021-07-24 MED ORDER — ALLOPURINOL 100 MG TAB
100 mg | Freq: Two times a day (BID) | ORAL | Status: DC
Start: 2021-07-24 — End: 2021-07-28
  Administered 2021-07-24 – 2021-07-28 (×11): via ORAL

## 2021-07-24 MED ORDER — MAGNESIUM OXIDE 400 MG TAB
400 mg | Freq: Two times a day (BID) | ORAL | Status: DC
Start: 2021-07-24 — End: 2021-07-28
  Administered 2021-07-25 – 2021-07-28 (×10): via ORAL

## 2021-07-24 MED ORDER — SPIRONOLACTONE 25 MG TAB
25 mg | Freq: Two times a day (BID) | ORAL | Status: DC
Start: 2021-07-24 — End: 2021-07-28
  Administered 2021-07-24 – 2021-07-28 (×10): via ORAL

## 2021-07-24 MED ORDER — PRAVASTATIN 20 MG TAB
20 mg | Freq: Every day | ORAL | Status: DC
Start: 2021-07-24 — End: 2021-07-28
  Administered 2021-07-25 – 2021-07-28 (×4): via ORAL

## 2021-07-24 MED ORDER — FLUTICASONE-SALMETEROL 250 MCG-50 MCG/DOSE DISK DEVICE FOR INHALATION
250-50 mcg/dose | Freq: Two times a day (BID) | RESPIRATORY_TRACT | Status: DC
Start: 2021-07-24 — End: 2021-07-24

## 2021-07-24 MED ORDER — ONDANSETRON 4 MG TAB, RAPID DISSOLVE
4 mg | Freq: Four times a day (QID) | ORAL | Status: DC | PRN
Start: 2021-07-24 — End: 2021-07-28

## 2021-07-24 MED ORDER — KETOROLAC TROMETHAMINE 30 MG/ML INJECTION
30 mg/mL (1 mL) | INTRAMUSCULAR | Status: AC
Start: 2021-07-24 — End: 2021-07-24
  Administered 2021-07-24: 18:00:00 via INTRAVENOUS

## 2021-07-24 MED ORDER — MONTELUKAST 10 MG TAB
10 mg | Freq: Every evening | ORAL | Status: DC
Start: 2021-07-24 — End: 2021-07-28
  Administered 2021-07-25 – 2021-07-28 (×4): via ORAL

## 2021-07-24 MED ORDER — LIDOCAINE 4 % TOPICAL PATCH (12 HOUR DURATION)
4 % | CUTANEOUS | Status: DC
Start: 2021-07-24 — End: 2021-07-28

## 2021-07-24 MED ORDER — HYDROCODONE-ACETAMINOPHEN 5 MG-325 MG TAB
5-325 mg | Freq: Four times a day (QID) | ORAL | Status: DC | PRN
Start: 2021-07-24 — End: 2021-07-28
  Administered 2021-07-24 – 2021-07-28 (×11): via ORAL

## 2021-07-24 MED ORDER — IRON SUCROSE 100 MG/5 ML IV SOLN
100 mg iron/5 mL | Freq: Once | INTRAVENOUS | Status: AC
Start: 2021-07-24 — End: 2021-07-24
  Administered 2021-07-24: 18:00:00 via INTRAVENOUS

## 2021-07-24 MED ORDER — IPRATROPIUM-ALBUTEROL 2.5 MG-0.5 MG/3 ML NEB SOLUTION
2.5 mg-0.5 mg/3 ml | Freq: Four times a day (QID) | RESPIRATORY_TRACT | Status: DC | PRN
Start: 2021-07-24 — End: 2021-07-24

## 2021-07-24 MED ORDER — PANTOPRAZOLE 40 MG TAB, DELAYED RELEASE
40 mg | Freq: Every day | ORAL | Status: DC
Start: 2021-07-24 — End: 2021-07-28
  Administered 2021-07-25 – 2021-07-28 (×4): via ORAL

## 2021-07-24 MED ORDER — MOMETASONE-FORMOTEROL HFA 200 MCG-5 MCG/ACTUATION AEROSOL INHALER
200-5 mcg/actuation | Freq: Two times a day (BID) | RESPIRATORY_TRACT | Status: DC
Start: 2021-07-24 — End: 2021-07-28
  Administered 2021-07-24 – 2021-07-28 (×9): via RESPIRATORY_TRACT

## 2021-07-24 MED FILL — ADVAIR DISKUS 250 MCG-50 MCG/DOSE POWDER FOR INHALATION: 250-50 mcg/dose | RESPIRATORY_TRACT | Qty: 14

## 2021-07-24 MED FILL — GABAPENTIN 100 MG CAP: 100 mg | ORAL | Qty: 1

## 2021-07-24 MED FILL — MAGNESIUM OXIDE 400 MG TAB: 400 mg | ORAL | Qty: 1

## 2021-07-24 MED FILL — HYDROCODONE-ACETAMINOPHEN 5 MG-325 MG TAB: 5-325 mg | ORAL | Qty: 1

## 2021-07-24 MED FILL — VENTOLIN HFA 90 MCG/ACTUATION AEROSOL INHALER: 90 mcg/actuation | RESPIRATORY_TRACT | Qty: 0

## 2021-07-24 MED FILL — MAGNESIUM SULFATE 2 GRAM/50 ML IVPB: 2 gram/50 mL (4 %) | INTRAVENOUS | Qty: 50

## 2021-07-24 MED FILL — SPIRONOLACTONE 25 MG TAB: 25 mg | ORAL | Qty: 1

## 2021-07-24 MED FILL — DULERA 200 MCG-5 MCG/ACTUATION HFA AEROSOL INHALER: 200-5 mcg/actuation | RESPIRATORY_TRACT | Qty: 0.03

## 2021-07-24 MED FILL — ENOXAPARIN 30 MG/0.3 ML SUB-Q SYRINGE: 30 mg/0.3 mL | SUBCUTANEOUS | Qty: 0.3

## 2021-07-24 MED FILL — ALLOPURINOL 100 MG TAB: 100 mg | ORAL | Qty: 1

## 2021-07-24 MED FILL — DULERA 200 MCG-5 MCG/ACTUATION HFA AEROSOL INHALER: 200-5 mcg/actuation | RESPIRATORY_TRACT | Qty: 1

## 2021-07-24 MED FILL — KETOROLAC TROMETHAMINE 30 MG/ML INJECTION: 30 mg/mL (1 mL) | INTRAMUSCULAR | Qty: 1

## 2021-07-24 MED FILL — VENOFER 100 MG IRON/5 ML INTRAVENOUS SOLUTION: 100 mg iron/5 mL | INTRAVENOUS | Qty: 5

## 2021-07-24 MED FILL — DILTIAZEM ER 120 MG 24 HR CAP: 120 mg | ORAL | Qty: 1

## 2021-07-24 MED FILL — LIDOCAINE PAIN RELIEF 4 % TOPICAL PATCH: 4 % | CUTANEOUS | Qty: 2

## 2021-07-24 MED FILL — SPIRIVA WITH HANDIHALER 18 MCG AND INHALATION CAPSULES: 18 mcg | RESPIRATORY_TRACT | Qty: 1

## 2021-07-24 NOTE — Progress Notes (Signed)
Medicare Outpatient Observation Notice (MOON) provided to patient/representative with verbal explanation of the notice. Time allotted for questions regarding the notice.  Patient /representative provided a completed copy of the MOON Copy placed on bedside chart.

## 2021-07-24 NOTE — Progress Notes (Signed)
Awaiting PT evaluation.  Magnesium 1.5, will replace.  Likely discharge home in a.m. after seen by PT.  Iron infusion today, hopefully this will allow her to be scheduled for her hip repair.

## 2021-07-24 NOTE — Progress Notes (Signed)
Problem: Gas Exchange - Impaired  Goal: Promote optimal lung function  Outcome: Progressing Towards Goal     Problem: Risk for Fluid Volume Deficit  Goal: Maintain normal serum potassium, sodium, calcium, phosphorus, and pH  Outcome: Progressing Towards Goal     Problem: Airway Clearance - Ineffective  Goal: Achieve or maintain patent airway  Outcome: Resolved/Met     Problem: Gas Exchange - Impaired  Goal: Absence of hypoxia  Outcome: Resolved/Met

## 2021-07-24 NOTE — Progress Notes (Signed)
Bedside shift change report given to S. Skyme Rn (oncoming nurse) by T. Webb Rn (offgoing nurse). Report included the following information SBAR, Kardex, Intake/Output, MAR, Recent Results, and Quality Measures.

## 2021-07-24 NOTE — Progress Notes (Signed)
 Progress  Notes by Clorinda, Marina  K at 07/24/21 1320                Author: Clorinda, Marina  K  Service: Pharmacist  Author Type: Pharmacist       Filed: 07/24/21 1320  Date of Service: 07/24/21 1320  Status: Signed          Editor: Clorinda, Marina  K (Pharmacist)                       Pharmacist Review and Automatic Dose Adjustment of Prophylactic Enoxaparin      The reviewing pharmacist has made an adjustment to the ordered enoxaparin dose or converted to UFH per the approved [facility] protocol and table as identified below.           Michele Mejia is a 80 y.o. female.       No lab exists for component: CREATININE      Estimated Creatinine Clearance: 47.4 mL/min (A) (based on SCr of 1.27 mg/dL (H)).      Height:      Ht Readings from Last 1 Encounters:        07/23/21  170.2 cm (67)       Weight:     Wt Readings from Last 1 Encounters:        07/23/21  113.4 kg (250 lb)                         Plan: Based upon the patient's weight and renal function, the ordered enoxaparin dose of 40mg  q24h has been changed/converted to 30 mg SQ q12h  for weight of 113 kg.         Thank you,   Marina  K Clorinda

## 2021-07-24 NOTE — Progress Notes (Signed)
 Care Management Interventions  PCP Verified by CM: Yes ([name] Jolee Flake)  Last Visit to PCP: 06/06/21  Palliative Care Criteria Met (RRAT>21 & CHF Dx)?: No (No MD order)  Mode of Transport at Discharge: Other (see comment) (POV)  Transition of Care Consult (CM Consult): Discharge Planning  MyChart Signup: No  Discharge Durable Medical Equipment: No  Physical Therapy Consult: Yes  Occupational Therapy Consult: No  Speech Therapy Consult: No  Support Systems: Caregiver/Home Care Staff, Child(ren)  Confirm Follow Up Transport: Family  The Plan for Transition of Care is Related to the Following Treatment Goals : Treat hip pain  Discharge Location  Patient Expects to be Discharged to:: Home with home health      Patient lives at home alone but has personal care aides through [employer] 7 days a week from 9am--3pm. Paid for by [employer]. She uses a walker at home and also has a recliner that she says is old (79 years old) and needs to get a new one. Unable to order that equipment. Told her to ask PCP to order. She does NOT have ACP document but states she would choose her daughter Sabrie Moritz as primary health care decision maker. This is next of kin. 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 Updated her chart to reflect this. Patient is currently in observation status. Patient states she is NOT participating in PT/OT d/t pain. Asked patient if she would be willing to go to a LTC facility. She REFUSED. Patient is currently in observation status. MOON explained to patient. Will have nurse/staff bring in to patient so she can sign. Patient also given care management info and told to call if she has any questions or concerns.     Reason for Admission:  Hip pain                      RUR Score:          N/A PT IN OBS STATUS             RRAT: 26 MODERATE            Plan for utilizing home health:      TBD, likely     PCP: First and Last name:  Flake Clearence Jolee, NP     Name of Practice:  [facility]    Are you a current patient: Yes/No: yes   Approximate date of last visit: 06/06/21   Can you participate in a virtual visit with your PCP: Yes                     Current Advanced Directive/Advance Care Plan: Full Code      Healthcare Decision Maker:   Click here to complete HealthCare Decision Makers including selection of the Healthcare Decision Maker Relationship (ie Primary)                             Transition of Care Plan:                  Home with hh when medically stable.       Advance Care Planning     General Advance Care Planning (ACP) Conversation      Date of Conversation: 07/23/2021  Conducted with: Patient with Decision Making Capacity    Healthcare Decision Maker:   No healthcare decision makers have  been documented.   Click here to complete Clinical research associate of the Healthcare Decision Maker Relationship (ie Primary)  Today we documented Decision Maker(s) consistent with Legal Next of Kin hierarchy.    Content/Action Overview:   Has NO ACP documents/care preferences - requested patient complete ACP documents  Topics discussed: treatment goals  Additional Comments: n/a       Length of Voluntary ACP Conversation in minutes:  16 minutes    Magalene LOISE Files                         Advance Care Planning     Advance Care Planning Activator (Inpatient)  Conversation Note      Date of ACP Conversation: 07/24/21     Conversation Conducted with:   Patient with Decision Making Capacity    ACP Activator: Magalene LOISE Files        Health Care Decision Maker:    Current Designated Health Care Decision Maker:     Click here to complete HealthCare Decision Makers including selection of the Healthcare Decision Maker Relationship (ie Primary)  Today we documented Decision Maker(s) consistent with Legal Next of Kin hierarchy.    Care Preferences    Ventilation:  If you were in your present state of health and suddenly became very ill and were unable to  breathe on your own, what would your preference be about the use of a ventilator (breathing machine) if it were available to you?      If patient would desire the use of a ventilator (breathing machine), answer yes, if not no:yes    If your health worsens and it becomes clear that your chance of recovery is unlikely, what would your preference be about the use of a ventilator (breathing machine) if it were available to you?     Would the patient desire the use of a ventilator (breathing machine)?  YES      Resuscitation  CPR works best to restart the heart when there is a sudden event, like a heart attack, in someone who is otherwise healthy. Unfortunately, CPR does not typically restart the heart for people who have serious health conditions or who are very sick.    In the event your heart stopped as a result of an underlying serious health condition, would you want attempts to be made to restart your heart (answer yes for attempt to resuscitate) or would you prefer a natural death (answer no for do not attempt to resuscitate)? yes      [x]  Yes  []  No   Educated Patient / Decision Maker regarding differences between Advance Directives and portable DNR orders.    Length of ACP Conversation in minutes:  16 min     Conversation Outcomes:  [x]  ACP discussion completed  []  Existing advance directive reviewed with patient; no changes to patient's previously recorded wishes     []  New Advance Directive completed   []  Portable Do Not Resuscitate prepared for Provider review and signature  []  POLST/POST/MOLST/MOST prepared for Provider review and signature      Follow-up plan:    [x]  Schedule follow-up conversation to continue planning  []  Referred individual to Provider for additional questions/concerns   []  Advised patient/agent/surrogate to review completed ACP document and update if needed with changes in condition, patient preferences or care setting     []  This note routed to one or more involved  healthcare providers

## 2021-07-24 NOTE — Progress Notes (Signed)
Problem: Pressure Injury - Risk of  Goal: *Prevention of pressure injury  Description: Document Braden Scale and appropriate interventions in the flowsheet.  Outcome: Progressing Towards Goal  Note: Pressure Injury Interventions:  Sensory Interventions: Assess changes in LOC, Float heels, Minimize linen layers    Moisture Interventions: Absorbent underpads, Check for incontinence Q2 hours and as needed    Activity Interventions: Increase time out of bed    Mobility Interventions: Float heels, HOB 30 degrees or less, Turn and reposition approx. every two hours(pillow and wedges)         Friction and Shear Interventions: HOB 30 degrees or less, Apply protective barrier, creams and emollients

## 2021-07-24 NOTE — Progress Notes (Signed)
Problem: Pressure Injury - Risk of  Goal: *Prevention of pressure injury  Description: Document Braden Scale and appropriate interventions in the flowsheet.  Outcome: Progressing Towards Goal  Note: Pressure Injury Interventions:  Sensory Interventions: Assess changes in LOC    Moisture Interventions: Absorbent underpads    Activity Interventions: Increase time out of bed    Mobility Interventions: HOB 30 degrees or less    Nutrition Interventions: Document food/fluid/supplement intake    Friction and Shear Interventions: HOB 30 degrees or less                Problem: Patient Education: Go to Patient Education Activity  Goal: Patient/Family Education  Outcome: Progressing Towards Goal     Problem: Journalist, newspaper - Impaired  Goal: Promote optimal lung function  Outcome: Progressing Towards Goal     Problem: Breathing Pattern - Ineffective  Goal: Ability to achieve and maintain a regular respiratory rate  Outcome: Progressing Towards Goal     Problem: Body Temperature -  Risk of, Imbalanced  Goal: Ability to maintain a body temperature within defined limits  Outcome: Progressing Towards Goal  Goal: Will regain or maintain usual level of consciousness  Outcome: Progressing Towards Goal  Goal: Complications related to the disease process, condition or treatment will be avoided or minimized  Outcome: Progressing Towards Goal     Problem: Isolation Precautions - Risk of Spread of Infection  Goal: Prevent transmission of infectious organism to others  Outcome: Progressing Towards Goal     Problem: Nutrition Deficits  Goal: Optimize nutrtional status  Outcome: Progressing Towards Goal     Problem: Risk for Fluid Volume Deficit  Goal: Maintain normal heart rhythm  Outcome: Progressing Towards Goal  Goal: Maintain absence of muscle cramping  Outcome: Progressing Towards Goal

## 2021-07-24 NOTE — H&P (Signed)
H&P by Lenis Noon, MD at 07/24/21 1117                Author: Lenis Noon, MD  Service: Hospitalist  Author Type: Physician       Filed: 07/24/21 1311  Date of Service: 07/24/21 1117  Status: Signed          Editor: Lenis Noon, MD (Physician)                          [facility]    Admission History & Physical            07/24/2021 11:17 AM   Patient: Michele Mejia 06/01/1941   PCP: Lynett Grimes, NP      HISTORY   Chief Complaint:      Chief Complaint       Patient presents with        ?  Hip Pain             Trip and fall resulting in left hip pain and shortening. + pulses, PIV established by rescue who gave 4 mg zofran and 25 mcg of fentanyl PTA. Pt tested  positive for COVID today           HPI: 80 y.o. female presenting for admission to [facility] for further evaluation and treatment for  <principal problem not specified>.  She  has a past medical history of Anemia, Asthma, Change in bowel habits, CKD (chronic kidney disease), COPD (chronic obstructive pulmonary disease) ([place]), Depression,  DJD (degenerative joint disease), GERD (gastroesophageal reflux disease), Glaucoma, Gout, HCVD (hypertensive cardiovascular disease), Obesity, OSA (obstructive sleep apnea), PAF (paroxysmal atrial fibrillation) ([place]) (12/08/2020), Rectal bleed, Ringing  in ear, and TIA (transient ischemic attack).. She reports the presenting symptoms presented to the emergency room last night after a fall.  She developed pain in the left hip.  Patient has chronic pain in that hip secondary to arthritis.  She is planning  on getting hip replacement at [facility], she states they are delaying surgery until she was to have an iron infusion today.  Nursing supervisor reports that the surgery was delayed secondary to request for patient to lose weight.  Patient states she has not  had success with PT in the past as this is made the pain worse.      Past Medical History:     Past Medical History:         Diagnosis  Date         ?  Anemia       ?  Asthma       ?  Change in bowel habits       ?  CKD (chronic kidney disease)       ?  COPD (chronic obstructive pulmonary disease) ([place])       ?  Depression       ?  DJD (degenerative joint disease)       ?  GERD (gastroesophageal reflux disease)       ?  Glaucoma       ?  Gout       ?  HCVD (hypertensive cardiovascular disease)       ?  Obesity       ?  OSA (obstructive sleep apnea)       ?  PAF (paroxysmal atrial fibrillation) ([place])  12/08/2020     ?  Rectal bleed       ?  Ringing in ear           ?  TIA (transient ischemic attack)             Past Surgical History:     Past Surgical History:         Procedure  Laterality  Date          ?  HX BREAST BIOPSY  Bilateral            neg          ?  HX BREAST REDUCTION         ?  HX CATARACT REMOVAL         ?  HX CHOLECYSTECTOMY    2016     ?  HX COLONOSCOPY    6.2014     ?  HX ENDOSCOPY    6.2014          chronic gastritis          ?  HX ENDOSCOPY    12/2014          gastritis          ?  HX HEART CATHETERIZATION    09/2009          EF 50%, Normal Coronaries          ?  HX HIP REPLACEMENT  Right            ?  HX HYSTERECTOMY               Medication:     Prior to Admission medications             Medication  Sig  Start Date  End Date  Taking?  Authorizing Provider            cholecalciferol (VITAMIN D3) (2,000 UNITS /50 MCG) cap capsule  Take 2,000 Units by mouth daily.  03/13/21    Yes  Provider, Historical     Xarelto 15 mg tab tablet  TAKE 1 TABLET BY MOUTH EVERY DAY WITH DINNER  04/10/21    Yes  Ericka Pontiff R., NP     fluticasone propionate (FLONASE NA)  by Nasal route two (2) times a day.      Yes  Provider, Historical     indapamide (LOZOL) 2.5 mg tablet  Take  by mouth daily.      Yes  Provider, Historical     pantoprazole sodium (PANTOPRAZOLE PO)  Take 40 mg by mouth daily.      Yes  Provider, Historical     azelastine-fluticasone 137-50 mcg/spray spry  by Nasal route two (2) times a day.      Yes  Provider,  Historical     gabapentin (NEURONTIN) 100 mg capsule  Take 100 mg by mouth three (3) times daily.      Yes  Provider, Historical     tiotropium (SPIRIVA) 18 mcg inhalation capsule  Take 1 Cap by inhalation daily.      Yes  Provider, Historical     dilTIAZem ER (CARDIZEM CD) 300 mg capsule  Take 300 mg by mouth daily.      Yes  Provider, Historical     sertraline (ZOLOFT) 50 mg tablet  Take  by mouth daily.      Yes  Provider, Historical     spironolactone (ALDACTONE) 25 mg tablet  Take 25 mg by mouth two (2) times a day.      Yes  Provider, Historical  atenolol (TENORMIN) 50 mg tablet  Take  by mouth daily.      Yes  Provider, Historical     CALCIUM PO  Take  by mouth. 1200 DAILY      Yes  Provider, Historical     clopidogrel (PLAVIX) 75 mg tablet  Take  by mouth daily.      Yes  Provider, Historical     albuterol (PROVENTIL HFA, VENTOLIN HFA, PROAIR HFA) 90 mcg/actuation inhaler  Take 2 Puffs by inhalation two (2) times a day.      Yes  Provider, Historical            ALLOPURINOL PO  Take 100 mg by mouth two (2) times a day.      Yes  Provider, Historical            PRAVASTATIN SODIUM (PRAVASTATIN PO)  Take 40 mg by mouth daily.      Yes  Provider, Historical     lactulose (CHRONULAC) 10 gram/15 mL solution  Take  by mouth daily. 2.5 mL daily.        Provider, Historical     montelukast (SINGULAIR) 10 mg tablet  Take 10 mg by mouth nightly.        Provider, Historical     OTHER  Vitamin d3 2,000 units daily.        Provider, Historical            fluticasone propion-salmeteroL (ADVAIR/WIXELA) 500-50 mcg/dose diskus inhaler  Take 1 Puff by inhalation two (2) times a day. Indications: Currently taking 500-74mg/dose diskus one puff twice a day        Provider, Historical           Allergies:     Allergies        Allergen  Reactions         ?  Nuts [Tree Nut]  Swelling     ?  Peanut  Swelling         ?  Simvastatin  Unable to Obtain           Social History:     Social History          Tobacco Use         ?   Smoking status:  Never     ?  Smokeless tobacco:  Never       Substance Use Topics         ?  Alcohol use:  No           Family History:     Family History         Problem  Relation  Age of Onset          ?  Hypertension  Mother       ?  Stroke  Mother       ?  Coronary Art Dis  Sister       ?  Hypertension  Sister       ?  Diabetes  Sister       ?  Hypertension  Brother       ?  Diabetes  Brother       ?  Coronary Art Dis  Brother       ?  Cancer  Brother                lung          ?  Hypertension  Brother       ?  Diabetes  Brother       ?  Cancer  Brother                prostate          ?  Diabetes  Sister             ROS:         Total of 12 systems reviewed as follows:   POSITIVE= bolded text  Negative = text not bolded         General:  fever, chills, sweats, generalized weakness, weight loss/gain, loss of appetite    Eyes:    blurred vision, eye pain, loss of vision, double vision   ENT:    rhinorrhea, pharyngitis    Respiratory:  cough, sputum production, SOB, DOE, wheezing, pleuritic pain    Cardiology:   chest pain, palpitations, orthopnea, PND, edema, syncope    Gastrointestinal:  abdominal pain , N/V, diarrhea, dysphagia, constipation, bleeding    Genitourinary:  frequency, urgency, dysuria, hematuria, incontinence, prostatism    Muskuloskeletal: Left hip pain   Hematology:   easy bruising, nose or gum bleeding, lymphadenopathy    Dermatological: rash, ulceration, pruritis, color change / jaundice   Endocrine:   hot flashes or polydipsia    Neurological:  headache, dizziness, confusion, focal weakness, paresthesia, speech difficulties, memory loss, gait difficulty   Psychological: feelings of anxiety, depression, agitation         PHYSICAL EXAM:   Patient Vitals for the past 24 hrs:            Temp  Pulse  Resp  BP  SpO2            07/24/21 0802  99.6 ??F (37.6 ??C)  82  20  116/78  90 %            07/24/21 0515  99.8 ??F (37.7 ??C)  87  20  (!) 147/77  96 %     07/23/21 2239  99.7 ??F (37.6 ??C)  81  20   (!) 155/74  98 %     07/23/21 2207  99 ??F (37.2 ??C)  --  --  (!) 149/82  92 %     07/23/21 2134  --  --  --  (!) 152/89  95 %     07/23/21 2107  --  --  --  (!) 144/62  98 %     07/23/21 2027  --  --  --  (!) 146/75  92 %     07/23/21 2009  --  --  --  (!) 151/77  95 %            07/23/21 1730  99.4 ??F (37.4 ??C)  90  20  (!) 184/66  97 %           General:    Alert, cooperative, no distress, appears stated age.      HEENT: Atraumatic, anicteric sclerae, pink conjunctivae      No oral ulcers, mucosa moist, throat clear, dentition fair   Neck:  Supple, symmetrical;   thyroid non tender   Lungs:   Normal respiratory effort   Chest wall:  No tenderness.  No accessory muscle use.   Abdomen:   Obese   Extremities: No cyanosis.  No clubbing       Capillary refill normal,  Radial pulse 2+,  DP    Skin:     Not pale.  Not jaundiced.  No rashes  Psych:  Good insight.  Not depressed.  Not anxious or agitated.   Neurologic: EOMs intact. No facial asymmetry. No aphasia or slurred speech.     Symmetrical strength, Sensation grossly intact. Alert and oriented X 4.      Lab Data Reviewed:       Recent Results (from the past 24 hour(s))     CBC WITH AUTOMATED DIFF          Collection Time: 07/23/21  5:43 PM         Result  Value  Ref Range            WBC  8.4  3.6 - 11.0 K/uL       RBC  3.40 (L)  3.80 - 5.20 M/uL            HGB  10.3 (L)  11.5 - 16.0 g/dL            HCT  32.7 (L)  35.0 - 47.0 %       MCV  96.2  80.0 - 99.0 FL       MCH  30.3  26.0 - 34.0 PG       MCHC  31.5  30.0 - 36.5 g/dL       RDW  14.7 (H)  11.5 - 14.5 %       PLATELET  197  150 - 400 K/uL       MPV  9.4  8.9 - 12.9 FL       NRBC  0.0  0 PER 100 WBC       ABSOLUTE NRBC  0.00  0.00 - 0.01 K/uL       NEUTROPHILS  84 (H)  32 - 75 %       BAND NEUTROPHILS  3  %       LYMPHOCYTES  5 (L)  12 - 49 %       MONOCYTES  5  5 - 13 %       EOSINOPHILS  1  0 - 7 %       BASOPHILS  2 (H)  0 - 1 %       IMMATURE GRANULOCYTES  0  0.0 - 0.5 %       ABS. NEUTROPHILS  7.3  1.8 -  8.0 K/UL       ABS. LYMPHOCYTES  0.4 (L)  0.8 - 3.5 K/UL       ABS. MONOCYTES  0.4  0.0 - 1.0 K/UL       ABS. EOSINOPHILS  0.1  0.0 - 0.4 K/UL       ABS. BASOPHILS  0.2 (H)  0.0 - 0.1 K/UL       ABS. IMM. GRANS.  0.0  0.00 - 0.04 K/UL       DF  MANUAL          PLATELET COMMENTS  ADEQUATE PLATELETS          RBC COMMENTS  TEARDROP CELLS   1+             METABOLIC PANEL, COMPREHENSIVE          Collection Time: 07/23/21  5:43 PM         Result  Value  Ref Range            Sodium  138  136 - 145 mmol/L       Potassium  4.3  3.5 - 5.1 mmol/L  Chloride  104  97 - 108 mmol/L            CO2  26  21 - 32 mmol/L            Anion gap  8  5 - 15 mmol/L       Glucose  184 (H)  65 - 100 mg/dL       BUN  17  6 - 20 MG/DL       Creatinine  1.27 (H)  0.55 - 1.02 MG/DL       BUN/Creatinine ratio  13  12 - 20         GFR est AA  49 (L)  >60 ml/min/1.10m       GFR est non-AA  41 (L)  >60 ml/min/1.716m      Calcium  8.5  8.5 - 10.1 MG/DL       Bilirubin, total  0.4  0.2 - 1.0 MG/DL       ALT (SGPT)  13  12 - 78 U/L       AST (SGOT)  14 (L)  15 - 37 U/L       Alk. phosphatase  81  45 - 117 U/L       Protein, total  6.7  6.4 - 8.2 g/dL       Albumin  2.9 (L)  3.5 - 5.0 g/dL       Globulin  3.8  2.0 - 4.0 g/dL       A-G Ratio  0.8 (L)  1.1 - 2.2         PROTHROMBIN TIME + INR          Collection Time: 07/23/21  5:43 PM         Result  Value  Ref Range            INR  1.3 (H)  0.9 - 1.1         Prothrombin time  12.7 (H)  9.0 - 11.1 sec       COVID-19 WITH INFLUENZA A/B          Collection Time: 07/23/21  5:53 PM         Result  Value  Ref Range            SARS-CoV-2 by PCR  Detected (A)  NOTD         Influenza A by PCR  Not detected  NOTD         Influenza B by PCR  Not detected  NOTD         URINALYSIS W/ RFLX MICROSCOPIC          Collection Time: 07/24/21  1:37 AM         Result  Value  Ref Range            Color  YELLOW/STRAW          Appearance  CLEAR  CLEAR         Specific gravity  1.015  1.003 - 1.030         pH (UA)  6.0  5.0  - 8.0         Protein  Negative  NEG mg/dL       Glucose  Negative  NEG mg/dL       Ketone  Negative  NEG mg/dL       Bilirubin  Negative  NEG  Blood  Negative  NEG         Urobilinogen  0.2  0.2 - 1.0 EU/dL       Nitrites  Negative  NEG              Leukocyte Esterase  Negative  NEG                Radiology:     CT HIP LT WO CONT       Final Result     Severe left hip joint osteoarthritis. Subchondral femoral head collapse, which     was not definitely seen on 02/01/2020 radiographs given differences in technique,     but is still an age-indeterminate finding.            XR HIP LT W OR WO PELV 2-3 VWS       Final Result     Deformity and flattening of the left femoral head against the     acetabulum. No femoral neck or femoral shaft fracture.     Right hip arthroplasty.                 XR FEMUR LT 2 V       Final Result     Deformity and flattening of the left femoral head against the     acetabulum. No femoral neck or femoral shaft fracture.     Right hip arthroplasty.                 XR CHEST SNGL V       Final Result     No acute cardiopulmonary abnormality.                       Care Plan discussed with:       Patient  x         Family        RN  x      Case Manager           Consultant          Expected  Disposition:       Home with Family  x        HH/PT/OT/RN       SNF/LTC          SAHR          TOTAL TIME:  40 Minutes              Comments           x  Reviewed previous records         >50% of visit spent in counseling and coordination of care  x  Discussion with patient and/or family and questions answered           _______________________________________________________   Given the patient's current clinical presentation, I have a high level of concern for decompensation if discharged from the emergency department.  Complex decision making was performed, which includes reviewing the patient's available past medical records,  laboratory results, and x-ray films.          My assessment of this  patient's clinical condition and my plan of care is as follows.      ASSESSMENT / PLAN      Active Problems:    Hip pain (07/23/2021)   Osteoarthritis with plan for hip replacement.  Fell yesterday, imaging in the emergency room including CT scan negative for fracture.  Surgery delayed  secondary to either need for iron stores to be  improved or for patient to lose weight.  Plan:  Physical therapy  Pain control with gabapentin, add Toradol and lidocaine patch   Follow-up with [facility] for hip replacement        Diabetes ([place]) (03/13/2013)   Glucose 184 last night.  Does not appear the patient is on outpatient medications.  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)       Overview: Ascending colon     GERD (gastroesophageal reflux disease) (08/18/2014)     Anemia (08/18/2014)  Patient hemoglobin 10, reportedly iron deficient and receiving iron infusions at [facility], scheduled for iron infusion today   Plan:  Will give iron infusion        Obesity (08/18/2014)  BMI 39        Dyslipidemia (08/18/2014)  Continue statin        TIA (transient ischemic attack) (08/18/2014)  Continue platelet inhibitor and statin        CKD (chronic kidney disease) ()   Stage IIIa CKD, creatinine 1.27 and GFR 49 today        PAF (paroxysmal atrial fibrillation) ([place]) (12/08/2020)   Continue Xarelto and atenolol                   SAFETY:    Code Status:Full   DVT prophylaxis:Lovenox   Stress Ulcer prophylaxis: Not Indicated  Bladder catheter :no  Family Contact Info:  Primary Emergency Contact: Cheyney,Gail, Home Phone: [phone]   Bedded: [facility] Room 116/01   Disposition: TBD, likely home when stable   Admission status:  Observation      -Tentative plan  of care discussed with patient / family, who demonstrated understanding and is in agreement to the above   -Case was reviewed with the ED Provider, Lenis Noon, MD      Lenis Noon, MD   [facility] Hospitalist   [phone]

## 2021-07-25 LAB — POCT GLUCOSE
POC Glucose: 138 mg/dL — ABNORMAL HIGH (ref 65–117)
POC Glucose: 122 mg/dL — ABNORMAL HIGH (ref 65–117)
POC Glucose: 156 mg/dL — ABNORMAL HIGH (ref 65–117)

## 2021-07-25 LAB — IRON AND TIBC
Iron Saturation: 18 % — ABNORMAL LOW (ref 20–50)
Iron: 39 ug/dL — ABNORMAL LOW (ref 50–170)
TIBC: 218 ug/dL — ABNORMAL LOW (ref 250–450)

## 2021-07-25 LAB — GLUCOSE, POC
Glucose (POC): 138 mg/dL — ABNORMAL HIGH (ref 65–117)
Glucose (POC): 122 mg/dL — ABNORMAL HIGH (ref 65–117)
Glucose (POC): 156 mg/dL — ABNORMAL HIGH (ref 65–117)

## 2021-07-25 LAB — IRON PROFILE
Iron % saturation: 18 % — ABNORMAL LOW (ref 20–50)
Iron: 39 ug/dL — ABNORMAL LOW (ref 50–170)
TIBC: 218 ug/dL — ABNORMAL LOW (ref 250–450)

## 2021-07-25 MED FILL — MAGNESIUM OXIDE 400 MG TAB: 400 mg | ORAL | Qty: 1

## 2021-07-25 MED FILL — ATENOLOL 25 MG TAB: 25 mg | ORAL | Qty: 2

## 2021-07-25 MED FILL — PRAVASTATIN 20 MG TAB: 20 mg | ORAL | Qty: 2

## 2021-07-25 MED FILL — ENOXAPARIN 30 MG/0.3 ML SUB-Q SYRINGE: 30 mg/0.3 mL | SUBCUTANEOUS | Qty: 0.3

## 2021-07-25 MED FILL — HYDROCODONE-ACETAMINOPHEN 5 MG-325 MG TAB: 5-325 mg | ORAL | Qty: 1

## 2021-07-25 MED FILL — XARELTO 15 MG TABLET: 15 mg | ORAL | Qty: 1

## 2021-07-25 MED FILL — CLOPIDOGREL 75 MG TAB: 75 mg | ORAL | Qty: 1

## 2021-07-25 MED FILL — SPIRONOLACTONE 25 MG TAB: 25 mg | ORAL | Qty: 1

## 2021-07-25 MED FILL — GABAPENTIN 100 MG CAP: 100 mg | ORAL | Qty: 1

## 2021-07-25 MED FILL — ALLOPURINOL 100 MG TAB: 100 mg | ORAL | Qty: 1

## 2021-07-25 MED FILL — PANTOPRAZOLE 40 MG TAB, DELAYED RELEASE: 40 mg | ORAL | Qty: 1

## 2021-07-25 MED FILL — LIDOCAINE PAIN RELIEF 4 % TOPICAL PATCH: 4 % | CUTANEOUS | Qty: 2

## 2021-07-25 MED FILL — SERTRALINE 50 MG TAB: 50 mg | ORAL | Qty: 1

## 2021-07-25 MED FILL — MONTELUKAST 10 MG TAB: 10 mg | ORAL | Qty: 1

## 2021-07-25 MED FILL — DILTIAZEM ER 120 MG 24 HR CAP: 120 mg | ORAL | Qty: 1

## 2021-07-25 NOTE — Progress Notes (Signed)
Called [place] and spoke with [place]. She said the the patient has spoke with [place]. Patsy Lager out for the day. Britta Mccreedy said she wasn't sure what their conversation was like. Unsure if patient could get respite hours or if patient could be seen even though they are positive for COVID. She said Patsy Lager would know all of that but she is out for the rest of the day. Called patient and she said that Patsy Lager is working on getting her respite hours but it would be unlikely that she could receive help at night. Ms. Turner didn't think her personal care aides would come out to see her since she is positive for COVID. Will need to reach out to [facility] tomorrow to make sure they can.      1543: Britta Mccreedy called back and said the patient's caregiver tested positive for COVID. She said she would not be able to work for 5 days. Asked Britta Mccreedy if they had any other care aides to staff Ms. Szymborski's case. She said they do not have any staff. Patient may have to go to LTC facility for a short amount of time. Or if she has any family to stay with her. Will reach out to patient about this situation.

## 2021-07-25 NOTE — Progress Notes (Signed)
IDR Team; MD, Nursing clinical coordinator, Care Manager, Physical therapy,  Pharmacy and chaplain, met to review patient's plan of care. Discussed goals, interventions, barriers and progress.     Team will continue to monitor progress and report any concerns to the physician and care management as indicated.     Transition of Care Plan: Per MD, patient had hip pain d/t advanced OA. Has plan to do surgery at [facility]. PT hasn't seen patient yet but will see today. Will see what PT/OT recommend. MD said discharge probably tomorrow. Likely will need home health. Patient already has personal care aides 7 days a week during the day through [place].

## 2021-07-25 NOTE — Progress Notes (Signed)
Problem: Pressure Injury - Risk of  Goal: *Prevention of pressure injury  Description: Document Braden Scale and appropriate interventions in the flowsheet.  Outcome: Progressing Towards Goal  Note: Pressure Injury Interventions:  Sensory Interventions: Float heels, Maintain/enhance activity level, Monitor skin under medical devices, Turn and reposition approx. every two hours (pillows and wedges if needed), Pressure redistribution bed/mattress (bed type)    Moisture Interventions: Absorbent underpads, Internal/External urinary devices, Maintain skin hydration (lotion/cream)    Activity Interventions: PT/OT evaluation, Pressure redistribution bed/mattress(bed type)    Mobility Interventions: Trapeze to reposition, Pressure redistribution bed/mattress (bed type), HOB 30 degrees or less, Float heels, Turn and reposition approx. every two hours(pillow and wedges)    Nutrition Interventions: Document food/fluid/supplement intake    Friction and Shear Interventions: HOB 30 degrees or less                Problem: Isolation Precautions - Risk of Spread of Infection  Goal: Prevent transmission of infectious organism to others  Outcome: Progressing Towards Goal     Problem: Falls - Risk of  Goal: *Absence of Falls  Description: Document Schmid Fall Risk and appropriate interventions in the flowsheet.  Outcome: Progressing Towards Goal  Note: Fall Risk Interventions:  Mobility Interventions: Bed/chair exit alarm         Medication Interventions: Bed/chair exit alarm, Patient to call before getting OOB    Elimination Interventions: Bed/chair exit alarm, Call light in reach    History of Falls Interventions: Bed/chair exit alarm         Problem: Diabetes Self-Management  Goal: *Incorporating physical activity into lifestyle  Description: State effect of exercise on blood glucose levels.  Outcome: Progressing Towards Goal

## 2021-07-25 NOTE — Progress Notes (Signed)
Problem: Airway Clearance - Ineffective  Goal: Able to cough effectively  Outcome: Progressing Towards Goal  Goal: Absence of airway secretions  Outcome: Progressing Towards Goal  Goal: Lung sounds clear or within normal limits for patient  Outcome: Progressing Towards Goal     Problem: Bronchodilation Therapy  Goal: Able to breathe comfortably (Bronchodilation Therapy)  Outcome: Progressing Towards Goal     Problem: Oxygen/Respiratory Function  Goal: Adequate oxygenation  Outcome: Progressing Towards Goal

## 2021-07-25 NOTE — Progress Notes (Signed)
Patient has been upgraded from Observation to Inpatient. Medicare pt has received, reviewed, and signed 1st IM letter informing her of her right to appeal the discharge.  Signed copied has been placed on pt bedside chart. Copy given to patient.

## 2021-07-25 NOTE — Progress Notes (Signed)
Bedside shift change report given to Sophia S. RN (oncoming nurse) by J. Pugh RN (offgoing nurse). Report included the following information [place].

## 2021-07-25 NOTE — Progress Notes (Signed)
Progress Notes by Denice Paradise, MD at 07/25/21 1039                Author: Denice Paradise, MD  Service: Hospitalist  Author Type: Physician       Filed: 07/25/21 1051  Date of Service: 07/25/21 1039  Status: Signed          Editor: Denice Paradise, MD (Physician)                       Hospitalist Progress Note      NAME: Michele Mejia    DOB:  [birth year]    MRN:  [id number]          Assessment / Plan:        80 year old female with past medical history of advanced osteoarthritis of the hip, type 2 diabetes mellitus, OSA, COPD, GI bleed, obesity, dyslipidemia, TIA, CKD stage IIIa, paroxysmal A. fib on anticoagulation presented with fall and not able to do  weightbearing for further evaluation.      # Advanced bilateral hip osteoarthritis.  Plan for hip replacement at [facility].   #Type 2 diabetes mellitus.   #Morbid obesity BMI 39.16.   #Hypertension.   #Obstructive sleep apnea.  COPD.   #GI bleeding history.  Angiodysplasia.  GERD.   #Hyperlipidemia.   #CKD stage IIIa.   #Paroxysmal A. fib.  On anticoagulation.   #History of TIAs.         -S/p fall.  Advanced osteoarthritis of the hip.  Imaging negative for any acute fracture.  Plan for surgery delayed secondary to anemia versus patient with loss goals.  Continue with pain control.  PT/OT evaluation.  Followed by [facility] for hip replacement.      -Type 2 diabetes mellitus.  A1c 7.3.  Accu-Chek ACH S.  Diabetic diet.  Insulin sliding scale.      -Anemia.  History of GERD.  History of angiodysplasia.  Hb at 10.  Continue with iron supplement.  Monitor H&H.      -Obesity.  BMI 39.  Nutrition consult.      -CKD stage IIIa.  Stable.  Avoid nephrotoxic medication.  Renally dose all medications.      -Paroxysmal A. fib.  CHADs2-VASC 7. Rate controlled continue with Xarelto/atenolol/cardizem.      30.0 - 39.9 Obese /  Body mass index is 39.16 kg/m??.      Estimated discharge date: August 10   Barriers:      Code status: Full   Prophylaxis: Coumadin   Recommended  Disposition: HH PT, OT, RN          Subjective:        Chief Complaint / Reason for Physician Visit   " No acute event.  Continues to have persistent bilateral hip pain.  Appetite at baseline.  Plan for PT OT today.".  Discussed with RN events overnight.       Review of Systems:           Symptom  Y/N  Comments    Symptom  Y/N  Comments             Fever/Chills  n      Chest Pain  n               Poor Appetite  n      Edema  n               Cough  Abdominal Pain  n       Sputum  n      Joint Pain  y       SOB/DOE  n      Pruritis/Rash         Nausea/vomit  n      Tolerating PT/OT  y       Diarrhea        Tolerating Diet                 Constipation        Other               Could NOT obtain due to:            Objective:        VITALS:    Last 24hrs VS reviewed since prior progress note. Most recent are:   Patient Vitals for the past 24 hrs:            Temp  Pulse  Resp  BP  SpO2            07/25/21 0728  98.4 ??F (36.9 ??C)  80  20  116/72  98 %            07/25/21 0234  98.6 ??F (37 ??C)  78  20  (!) 101/52  94 %     07/24/21 2011  98.7 ??F (37.1 ??C)  77  20  (!) 110/59  95 %            07/24/21 1714  98.6 ??F (37 ??C)  89  20  (!) 110/51  95 %           Intake/Output Summary (Last 24 hours) at 07/25/2021 1040   Last data filed at 07/25/2021 0900     Gross per 24 hour        Intake  600 ml        Output  400 ml        Net  200 ml            I had a face to face encounter and independently examined this patient on 07/25/2021, as outlined below:   PHYSICAL EXAM:   General: WD, WN. Alert, cooperative, no acute distress     EENT:  EOMI. Anicteric sclerae. MMM   Resp:  CTA bilaterally, no wheezing or rales.  No accessory muscle use   CV:  Regular  rhythm,  No edema   GI:  Soft, Non distended, Non tender.  +Bowel sounds   Neurologic:  Alert and oriented X 3, normal speech,    Psych:   Good insight. Not anxious nor agitated   Skin:  No rashes.  No jaundice      Reviewed most current lab test results and cultures  YES   Reviewed  most current radiology test results   YES   Review and summation of old records today    NO   Reviewed patient's current orders and MAR    YES   PMH/SH reviewed - no change compared to H&P   ________________________________________________________________________   Care Plan discussed with:           Comments         Patient  x           Family   x           RN         Care Manager  Consultant                                  Multidiciplinary team rounds were held today with case manager, nursing, pharmacist and Higher education careers adviser.  Patient's plan of care was discussed; medications  were reviewed and discharge planning was addressed.       ________________________________________________________________________   Total NON critical care TIME:  35   Minutes      Total CRITICAL CARE TIME Spent:   Minutes non procedure based              Comments         >50% of visit spent in counseling and coordination of care         ________________________________________________________________________   Denice Paradise, MD       Procedures: see electronic medical records for all procedures/Xrays and details which were not copied into this note but were reviewed prior to creation of Plan.        LABS:   I reviewed today's most current labs and imaging studies.   Pertinent labs include:     Recent Labs           07/23/21   1743     WBC  8.4     HGB  10.3*     HCT  32.7*        PLT  197          Recent Labs            07/24/21   1308  07/23/21   1743     NA   --   138     K   --   4.3     CL   --   104     CO2   --   26     GLU   --   184*     BUN   --   17     CREA   --   1.27*     CA   --   8.5     MG  1.5*   --      PHOS  2.8   --      ALB   --   2.9*     TBILI   --   0.4     ALT   --   13         INR   --   1.3*           Signed: Denice Paradise, MD

## 2021-07-25 NOTE — Progress Notes (Signed)
 Problem: Mobility Impaired (Adult and Pediatric)  Goal: *Acute Goals and Plan of Care (Insert Text)  Description: FUNCTIONAL STATUS PRIOR TO ADMISSION: Patient was modified independent using a rollator for functional mobility. Patient reports she is limited to household distance secondary to hip pain (awaiting hip surgery). Has paid caregiver assistance for some LB dressing and bathing activity. Aide assists with transportation to appointments. Use of w/c for community mobility.     HOME SUPPORT PRIOR TO ADMISSION: The patient lived alone with no local support. Has paid caregiver assistance 6 hours/day.     Physical Therapy Goals  Initiated 07/25/2021  1.  Patient will move from supine to sit and sit to supine , scoot up and down, and roll side to side in bed with moderate assistance  within 7 day(s).    2.  Patient will transfer from bed to chair and chair to bed with moderate assistance  using the least restrictive device within 7 day(s).  3.  Patient will perform sit to stand with moderate assistance  within 7 day(s).  4.  Patient will ambulate with moderate assistance  for 50 feet with the least restrictive device within 7 day(s).     Outcome: Not Met  PHYSICAL THERAPY EVALUATION  Patient: Michele Mejia (80 y.o. female)  Date: 07/25/2021  Primary Diagnosis: Hip pain [M25.559]       Precautions: Fall, Contact (COVID)    ASSESSMENT  Based on the objective data described below, the patient presents with increased left hip pain, decreased LLE ROM, generalized weakness with increased limitations LLE secondary to pain, impaired standing balance, decreased activity tolerance/endurance all limiting patients functional mobility. She was admitted s/p fall and presents with a significant fall risk. She has baseline hip pain, while awaiting hip surgery, but pain currently greater than baseline and further limiting mobility. Patient required significant time for bed mobility, Mod-Max A to stand and limited standing  tolerance. She was only able to attempt side stepping towards HOB. Patient is unable to safely transfer to chair at this time causing concerns with patient returning home with current mobility and fall risk. She has paid caregiver assistance for 6 hours but then home alone otherwise She will benefit from continued skilled therapy and pain management.   Dependent on progress, patient might need increased caregiver assistance vs placement for assistance until she is able to arrange hip surgery or has improvements in pain management.     Current Level of Function Impacting Discharge (mobility/balance): Mod-Max A, very limited standing tolerance, unable to ambulate     Functional Outcome Measure:  The patient scored 35/100 on the Barthel Index outcome measure which is indicative of very dependent.      Other factors to consider for discharge: lives alone, only has caregiver assistance 6 hrs/day, high fall risk, history of fall, decline in mobility      Patient will benefit from skilled therapy intervention to address the above noted impairments.       PLAN :  Recommendations and Planned Interventions: bed mobility training, transfer training, gait training, therapeutic exercises, patient and family training/education, and therapeutic activities      Frequency/Duration: Patient will be followed by physical therapy:  1-2 times/day, 4-6 times a week to address goals.    Recommendation for discharge: (in order for the patient to meet his/her long term goals)  To be determined: based on progress and pain management. Currently recommend placement for pain management and assistance  vs increasing paid caregiver assistance at home.  Patient hopeful to return home and to arrange for hip surgery soon     This discharge recommendation:  Has been made in collaboration with the attending provider and/or case management    IF patient discharges home will need the following DME: TBD: possible BSC       SUBJECTIVE:   Patient stated "I  usually hurt but not this bad."    OBJECTIVE DATA SUMMARY:   HISTORY:    Past Medical History:   Diagnosis Date    Anemia     Asthma     Change in bowel habits     CKD (chronic kidney disease)     COPD (chronic obstructive pulmonary disease) (HCC)     Depression     DJD (degenerative joint disease)     GERD (gastroesophageal reflux disease)     Glaucoma     Gout     HCVD (hypertensive cardiovascular disease)     Obesity     OSA (obstructive sleep apnea)     PAF (paroxysmal atrial fibrillation) (HCC) 12/08/2020    Rectal bleed     Ringing in ear     TIA (transient ischemic attack)      Past Surgical History:   Procedure Laterality Date    HX BREAST BIOPSY Bilateral     neg    HX BREAST REDUCTION      HX CATARACT REMOVAL      HX CHOLECYSTECTOMY  2016    HX COLONOSCOPY  6.2014    HX ENDOSCOPY  6.2014    chronic gastritis    HX ENDOSCOPY  12/2014    gastritis    HX HEART CATHETERIZATION  09/2009    EF 50%, Normal Coronaries    HX HIP REPLACEMENT Right     HX HYSTERECTOMY         Personal factors and/or comorbidities impacting plan of care:     Home Situation  Home Environment: Private residence  # Steps to Enter: 0 (ramp)  Wheelchair Ramp: Yes  One/Two Story Residence: One story  Living Alone: Yes  Support Systems: Radiographer, therapeutic (6 hours/day, otherwise home alone during the day)  Patient Expects to be Discharged to:: Home with home health  Current DME Used/Available at Home: Vannie, rolling, Environmental consultant, rollator, Tub transfer bench  Tub or Shower Type: Tub/Shower combination    EXAMINATION/PRESENTATION/DECISION MAKING:   Critical Behavior:   Alert      Hearing:  Auditory  Auditory Impairment: None  Range Of Motion:  AROM: Grossly decreased, non-functional (LLE secondary to pain, Generally decreased otherwise)                       Strength:    Strength: Grossly decreased, non-functional (LLE secondary to pain, Generally decreased otherwise)                    Tone & Sensation:   Tone: Normal               Sensation: Intact               Coordination:  Coordination: Within functional limits    Functional Mobility:  Bed Mobility:  Rolling: Moderate assistance;Adaptive equipment;Additional time;Assist x1;Bed Modified (HOB elevated, use of bedrail, significant increased time)  Supine to Sit: Moderate assistance;Maximum assistance;Additional time;Assist x1;Bed Modified;Adaptive equipment (HOB elevated, use of bedrail, significant increased time)  Sit to Supine: Moderate assistance;Maximum assistance;Assist x1 (dependent for BLE)  Scooting: Moderate assistance;Additional time  Transfers:  Sit to Stand: Maximum assistance;Additional time (unable to achieve full upright posture, maintains kyphotic and hip flexion throughout)  Stand to Sit: Moderate assistance;Maximum assistance                       Balance:   Sitting: Intact  Standing: Impaired  Standing - Static: Fair  Standing - Dynamic : Poor  Ambulation/Gait Training:  Distance (ft): 1 Feet (ft) (limited to attempts to side step towards [facility])  Assistive Device: Walker, rolling;Gait belt  Ambulation - Level of Assistance: Maximum assistance;Additional time;Assist x1        Gait Abnormalities: Antalgic;Decreased step clearance;Trunk sway increased        Base of Support: Widened  Stance: Weight shift (maintains kyphotic and hip flexion throughout)  Speed/Cadence: Delayed;Pace decreased (<100 feet/min);Shuffled;Slow  Step Length: Left shortened;Right shortened                  Patient unable to achieve full upright position. Difficulty WB LLE. Reports feelings of right knee wanting to buckle. Difficulty advancing feet. Fatigues quickly     Therapeutic Exercises:   Barthel Index:    Bathing: 0  Bladder: 5  Bowels: 10  Grooming: 5  Dressing: 5  Feeding: 10  Mobility: 0  Stairs: 0  Toilet Use: 0  Transfer (Bed to Chair and Back): 0  Total: 35/100     The Barthel ADL Index: Guidelines  1. The index should be used as a record of what a patient does, not as a record of what a  patient could do.  2. The main aim is to establish degree of independence from any help, physical or verbal, however minor and for whatever reason.  3. The need for supervision renders the patient not independent.  4. A patient's performance should be established using the best available evidence. Asking the patient, friends/relatives and nurses are the usual sources, but direct observation and common sense are also important. However direct testing is not needed.  5. Usually the patient's performance over the preceding 24-48 hours is important, but occasionally longer periods will be relevant.  6. Middle categories imply that the patient supplies over 50 per cent of the effort.  7. Use of aids to be independent is allowed.    Score Interpretation (from Sinoff 1997)   80-100 Independent   60-79 Minimally independent   40-59 Partially dependent   20-39 Very dependent   <20 Totally dependent     -Mahoney, F.l., Barthel, D.W. (1965). Functional evaluation: the Barthel Index. [place] [address] (14)2.  -Sinoff, G., Ore, L. (1997). The Barthel activities of daily living index: self-reporting versus actual performance in the old (> or = 75 years). Journal of American Geriatric Society 45(7), [phone].   -Fleeta cotton [place], J.J.M.F, Orman ROES., Oneita MERYL Sebastian DELORSE. (1999). Measuring the change in disability after inpatient rehabilitation; comparison of the responsiveness of the Barthel Index and Functional Independence Measure. Journal of Neurology, Neurosurgery, and Psychiatry, 66(4), [phone].  Marea Chillington, N.J.A, Scholte op [place],  W.J.M, & Koopmanschap, M.A. (2004) Assessment of post-stroke quality of life in cost-effectiveness studies: The usefulness of the Barthel Index and the EuroQoL-5D. Quality of Life Research, 13, 427-43        Functional Measure:  Barthel Index:    Bathing: 0  Bladder: 5  Bowels: 10  Grooming: 5  Dressing: 5  Feeding: 10  Mobility: 0  Stairs: 0  Toilet Use: 0  Transfer (Bed to Chair  and Back):  0  Total: 35/100     The Barthel ADL Index: Guidelines  1. The index should be used as a record of what a patient does, not as a record of what a patient could do.  2. The main aim is to establish degree of independence from any help, physical or verbal, however minor and for whatever reason.  3. The need for supervision renders the patient not independent.  4. A patient's performance should be established using the best available evidence. Asking the patient, friends/relatives and nurses are the usual sources, but direct observation and common sense are also important. However direct testing is not needed.  5. Usually the patient's performance over the preceding 24-48 hours is important, but occasionally longer periods will be relevant.  6. Middle categories imply that the patient supplies over 50 per cent of the effort.  7. Use of aids to be independent is allowed.    Score Interpretation (from Sinoff 1997)   80-100 Independent   60-79 Minimally independent   40-59 Partially dependent   20-39 Very dependent   <20 Totally dependent     -Mahoney, F.l., Barthel, D.W. (1965). Functional evaluation: the Barthel Index. [place] [address] (14)2.  -Sinoff, G., Ore, L. (1997). The Barthel activities of daily living index: self-reporting versus actual performance in the old (> or = 75 years). Journal of American Geriatric Society 45(7), [phone].   -Fleeta cotton [place], J.J.M.F, Orman ROES., Oneita MERYL Sebastian DELORSE. (1999). Measuring the change in disability after inpatient rehabilitation; comparison of the responsiveness of the Barthel Index and Functional Independence Measure. Journal of Neurology, Neurosurgery, and Psychiatry, 66(4), [phone].  Marea Chillington, N.J.A, Scholte op [place],  W.J.M, & Koopmanschap, M.A. (2004) Assessment of post-stroke quality of life in cost-effectiveness studies: The usefulness of the Barthel Index and the EuroQoL-5D. Quality of Life Research, 22, 572-56        Physical Therapy Evaluation Charge  Determination   History Examination Presentation Decision-Making   MEDIUM  Complexity : 1-2 comorbidities / personal factors will impact the outcome/ POC  LOW Complexity : 1-2 Standardized tests and measures addressing body structure, function, activity limitation and / or participation in recreation  LOW Complexity : Stable, uncomplicated  Other outcome measures Barthel Index  MEDIUM      Based on the above components, the patient evaluation is determined to be of the following complexity level: LOW     Pain Rating:  Reports 10/10 pain with mobility. Improves with rest.     Activity Tolerance:   Fair, requires rest breaks, and observed SOB with activity    After treatment patient left in no apparent distress:   Supine in bed, Heels elevated for pressure relief, Call bell within reach, Bed / chair alarm activated, and Side rails x 3    COMMUNICATION/EDUCATION:   The patient's plan of care was discussed with: Registered nurse and Case management.     Fall prevention education was provided and the patient/caregiver indicated understanding., Patient/family have participated as able in goal setting and plan of care., and Patient/family agree to work toward stated goals and plan of care.    Thank you for this referral.  Alfonso Cunning, PT, DPT   Time Calculation: 73 mins

## 2021-07-25 NOTE — Progress Notes (Signed)
 1838:  BG 156 after pt ate dinner.    Difficulty with turning patient as any slight movement or touch she would say ouch or grab you hand away from her body not to touch her.  Pt turns best to the left side.

## 2021-07-26 LAB — POCT GLUCOSE
POC Glucose: 153 mg/dL — ABNORMAL HIGH (ref 65–117)
POC Glucose: 130 mg/dL — ABNORMAL HIGH (ref 65–117)
POC Glucose: 125 mg/dL — ABNORMAL HIGH (ref 65–117)
POC Glucose: 128 mg/dL — ABNORMAL HIGH (ref 65–117)

## 2021-07-26 LAB — CBC
Hematocrit: 30.6 % — ABNORMAL LOW (ref 35.0–47.0)
Hemoglobin: 9.7 g/dL — ABNORMAL LOW (ref 11.5–16.0)
MCH: 30.3 PG (ref 26.0–34.0)
MCHC: 31.7 g/dL (ref 30.0–36.5)
MCV: 95.6 FL (ref 80.0–99.0)
MPV: 9.2 FL (ref 8.9–12.9)
NRBC Absolute: 0 10*3/uL (ref 0.00–0.01)
Nucleated RBCs: 0 PER 100 WBC
Platelets: 170 10*3/uL (ref 150–400)
RBC: 3.2 M/uL — ABNORMAL LOW (ref 3.80–5.20)
RDW: 14.4 % (ref 11.5–14.5)
WBC: 7.5 10*3/uL (ref 3.6–11.0)

## 2021-07-26 LAB — BASIC METABOLIC PANEL
Anion Gap: 9 mmol/L (ref 5–15)
BUN: 16 MG/DL (ref 6–20)
Bun/Cre Ratio: 14 (ref 12–20)
CO2: 26 mmol/L (ref 21–32)
Calcium: 8.5 MG/DL (ref 8.5–10.1)
Chloride: 103 mmol/L (ref 97–108)
Creatinine: 1.17 MG/DL — ABNORMAL HIGH (ref 0.55–1.02)
EGFR IF NonAfrican American: 45 mL/min/{1.73_m2} — ABNORMAL LOW (ref >60)
GFR African American: 54 mL/min/{1.73_m2} — ABNORMAL LOW (ref >60)
Glucose: 124 mg/dL — ABNORMAL HIGH (ref 65–100)
Potassium: 4.4 mmol/L (ref 3.5–5.1)
Sodium: 138 mmol/L (ref 136–145)

## 2021-07-26 LAB — METABOLIC PANEL, BASIC
Anion gap: 9 mmol/L (ref 5–15)
BUN/Creatinine ratio: 14 (ref 12–20)
BUN: 16 MG/DL (ref 6–20)
CO2: 26 mmol/L (ref 21–32)
Calcium: 8.5 MG/DL (ref 8.5–10.1)
Chloride: 103 mmol/L (ref 97–108)
Creatinine: 1.17 MG/DL — ABNORMAL HIGH (ref 0.55–1.02)
GFR est AA: 54 mL/min/{1.73_m2} — ABNORMAL LOW (ref >60)
GFR est non-AA: 45 mL/min/{1.73_m2} — ABNORMAL LOW (ref >60)
Glucose: 124 mg/dL — ABNORMAL HIGH (ref 65–100)
Potassium: 4.4 mmol/L (ref 3.5–5.1)
Sodium: 138 mmol/L (ref 136–145)

## 2021-07-26 LAB — CBC W/O DIFF
ABSOLUTE NRBC: 0 10*3/uL (ref 0.00–0.01)
HCT: 30.6 % — ABNORMAL LOW (ref 35.0–47.0)
HGB: 9.7 g/dL — ABNORMAL LOW (ref 11.5–16.0)
MCH: 30.3 PG (ref 26.0–34.0)
MCHC: 31.7 g/dL (ref 30.0–36.5)
MCV: 95.6 FL (ref 80.0–99.0)
MPV: 9.2 FL (ref 8.9–12.9)
NRBC: 0 PER 100 WBC
PLATELET: 170 10*3/uL (ref 150–400)
RBC: 3.2 M/uL — ABNORMAL LOW (ref 3.80–5.20)
RDW: 14.4 % (ref 11.5–14.5)
WBC: 7.5 10*3/uL (ref 3.6–11.0)

## 2021-07-26 LAB — GLUCOSE, POC
Glucose (POC): 153 mg/dL — ABNORMAL HIGH (ref 65–117)
Glucose (POC): 130 mg/dL — ABNORMAL HIGH (ref 65–117)
Glucose (POC): 125 mg/dL — ABNORMAL HIGH (ref 65–117)
Glucose (POC): 128 mg/dL — ABNORMAL HIGH (ref 65–117)

## 2021-07-26 MED FILL — PRAVASTATIN 20 MG TAB: 20 mg | ORAL | Qty: 2

## 2021-07-26 MED FILL — ALLOPURINOL 100 MG TAB: 100 mg | ORAL | Qty: 1

## 2021-07-26 MED FILL — DILTIAZEM ER 120 MG 24 HR CAP: 120 mg | ORAL | Qty: 1

## 2021-07-26 MED FILL — GABAPENTIN 100 MG CAP: 100 mg | ORAL | Qty: 1

## 2021-07-26 MED FILL — SERTRALINE 50 MG TAB: 50 mg | ORAL | Qty: 1

## 2021-07-26 MED FILL — MONTELUKAST 10 MG TAB: 10 mg | ORAL | Qty: 1

## 2021-07-26 MED FILL — HYDROCODONE-ACETAMINOPHEN 5 MG-325 MG TAB: 5-325 mg | ORAL | Qty: 1

## 2021-07-26 MED FILL — MAGNESIUM OXIDE 400 MG TAB: 400 mg | ORAL | Qty: 1

## 2021-07-26 MED FILL — CLOPIDOGREL 75 MG TAB: 75 mg | ORAL | Qty: 1

## 2021-07-26 MED FILL — LIDOCAINE PAIN RELIEF 4 % TOPICAL PATCH: 4 % | CUTANEOUS | Qty: 2

## 2021-07-26 MED FILL — PANTOPRAZOLE 40 MG TAB, DELAYED RELEASE: 40 mg | ORAL | Qty: 1

## 2021-07-26 MED FILL — ENOXAPARIN 30 MG/0.3 ML SUB-Q SYRINGE: 30 mg/0.3 mL | SUBCUTANEOUS | Qty: 0.3

## 2021-07-26 MED FILL — SPIRONOLACTONE 25 MG TAB: 25 mg | ORAL | Qty: 1

## 2021-07-26 MED FILL — ATENOLOL 25 MG TAB: 25 mg | ORAL | Qty: 2

## 2021-07-26 MED FILL — XARELTO 15 MG TABLET: 15 mg | ORAL | Qty: 1

## 2021-07-26 NOTE — Progress Notes (Signed)
Bedside shift change report given to T. Jenkins Rn (oncoming nurse) by T. Webb Rn (offgoing nurse). Report included the following information SBAR, Kardex, Intake/Output, MAR, Recent Results, and Quality Measures.

## 2021-07-26 NOTE — Progress Notes (Signed)
Bedside and Verbal shift change report given to T. Webb RN (oncoming nurse) by K. Hudnall RN (offgoing nurse). Report included the following information SBAR, Kardex, Intake/Output, MAR, and Recent Results.

## 2021-07-26 NOTE — Progress Notes (Signed)
Problem: Airway Clearance - Ineffective  Goal: Able to cough effectively  Outcome: Progressing Towards Goal  Goal: Absence of airway secretions  Outcome: Progressing Towards Goal  Goal: Lung sounds clear or within normal limits for patient  Outcome: Progressing Towards Goal     Problem: Bronchodilation Therapy  Goal: Able to breathe comfortably (Bronchodilation Therapy)  Outcome: Progressing Towards Goal     Problem: Oxygen/Respiratory Function  Goal: Adequate oxygenation  Outcome: Progressing Towards Goal

## 2021-07-26 NOTE — Progress Notes (Signed)
Problem: Pressure Injury - Risk of  Goal: *Prevention of pressure injury  Description: Document Braden Scale and appropriate interventions in the flowsheet.  Outcome: Progressing Towards Goal  Note: Pressure Injury Interventions:  Sensory Interventions: Float heels, Keep linens dry and wrinkle-free, Minimize linen layers, Assess changes in LOC, Check visual cues for pain    Moisture Interventions: Absorbent underpads, Apply protective barrier, creams and emollients, Internal/External urinary devices    Activity Interventions: Increase time out of bed    Mobility Interventions: HOB 30 degrees or less, Float heels, Turn and reposition approx. every two hours(pillow and wedges)    Nutrition Interventions: Document food/fluid/supplement intake    Friction and Shear Interventions: HOB 30 degrees or less, Apply protective barrier, creams and emollients                Problem: Patient Education: Go to Patient Education Activity  Goal: Patient/Family Education  Outcome: Progressing Towards Goal     Problem: Gas Exchange - Impaired  Goal: Promote optimal lung function  Outcome: Progressing Towards Goal     Problem: Breathing Pattern - Ineffective  Goal: Ability to achieve and maintain a regular respiratory rate  Outcome: Progressing Towards Goal     Problem: Body Temperature -  Risk of, Imbalanced  Goal: Ability to maintain a body temperature within defined limits  Outcome: Progressing Towards Goal     Problem: Isolation Precautions - Risk of Spread of Infection  Goal: Prevent transmission of infectious organism to others  Outcome: Progressing Towards Goal     Problem: Loneliness or Risk for Loneliness  Goal: Demonstrate positive use of time alone when socialization is not possible  Outcome: Progressing Towards Goal     Problem: Patient Education: Go to Patient Education Activity  Goal: Patient/Family Education  Outcome: Progressing Towards Goal     Problem: Falls - Risk of  Goal: *Absence of Falls  Description: Document  Schmid Fall Risk and appropriate interventions in the flowsheet.  Outcome: Progressing Towards Goal  Note: Fall Risk Interventions:  Mobility Interventions: Bed/chair exit alarm, Patient to call before getting OOB, Utilize walker, cane, or other assistive device         Medication Interventions: Bed/chair exit alarm, Patient to call before getting OOB    Elimination Interventions: Bed/chair exit alarm, Call light in reach, Patient to call for help with toileting needs    History of Falls Interventions: Bed/chair exit alarm, Door open when patient unattended, Room close to nurse's station

## 2021-07-26 NOTE — Progress Notes (Signed)
IDR Team; MD, Nursing, Care Manager, , Pharmacy, Nursing Supervisor, met to review patient's plan of care. Discussed goals, interventions, barriers and progress.     RUR: 15%  MEDIUM    Team will continue to monitor progress and report any concerns to the physician and care management as indicated.    Transition of Care Plan:    Explained to the patient since she has been upgraded to Inpatient, we will have therapy evaluate and assess her for skilled care. 3 midnight qualifying stay: 8/9,10/11 and eligible for skilled care on 07/28/21.   Patient agrees with the plan for skilled care. We called [employer] yesterday to see if they could increase her hours of coverage, but the problem is her personal care aide has COVID also. Will f/u with therapy.

## 2021-07-26 NOTE — Progress Notes (Signed)
Problem: Bronchodilation Therapy  Goal: Able to breathe comfortably (Bronchodilation Therapy)  Outcome: Progressing Towards Goal     Problem: Patient Education: Go to Patient Education Activity  Goal: Patient/Family Education  Outcome: Progressing Towards Goal     Problem: Gas Exchange - Impaired  Goal: *Absence of hypoxia  Outcome: Progressing Towards Goal

## 2021-07-26 NOTE — Progress Notes (Signed)
Bedside shift change report given to K. Hudnall Rn (oncoming nurse) by T. Webb Rn (offgoing nurse). Report included the following information SBAR, Kardex, Intake/Output, MAR, Recent Results, and Quality Measures.

## 2021-07-26 NOTE — Progress Notes (Signed)
 Physical Therapy  Chart reviewed and attempted to see patient for PT treatment. Patient in bed on arrival. Patient reports feeling worse from the COVID. Patient with increased lethargy, coughing and malaise. Due to patient not feeling well, session aborted to allow patient time to rest. Will continue to follow.   Alfonso Cunning, PT, DPT

## 2021-07-26 NOTE — Progress Notes (Signed)
Contact Dr. Lupita Leash about pt being lethargic and mews score on 3 with a temp of 101.2    2136 Dr Lupita Leash  ordered tyelnol 650 every 4 hours and blood and lactic acid labs to be drawn.    Nursing supervisor called to obtained labs    MD contacted and advised to draw labs in the AM

## 2021-07-26 NOTE — Progress Notes (Signed)
Progress Notes by Denice Paradise, MD at 07/26/21 1353                Author: Denice Paradise, MD  Service: Hospitalist  Author Type: Physician       Filed: 07/26/21 1355  Date of Service: 07/26/21 1353  Status: Signed          Editor: Denice Paradise, MD (Physician)                       Hospitalist Progress Note      NAME: Michele Mejia    DOB:  1941-09-20    MRN:  [id number]          Assessment / Plan:        80 year old female with past medical history of advanced osteoarthritis of the hip, type 2 diabetes mellitus, OSA, COPD, GI bleed, obesity, dyslipidemia, TIA, CKD stage IIIa, paroxysmal A. fib on anticoagulation presented with fall and not able to do  weightbearing for further evaluation.      # Advanced bilateral hip osteoarthritis.  Plan for hip replacement at [facility].   #Type 2 diabetes mellitus.   #Morbid obesity BMI 39.16.   #Hypertension.   #Obstructive sleep apnea.  COPD.   #GI bleeding history.  Angiodysplasia.  GERD.   #Hyperlipidemia.   #CKD stage IIIa.   #Paroxysmal A. fib.  On anticoagulation.   #History of TIAs.         -S/p mechanical fall.  Advanced osteoarthritis of the hip.  Imaging negative for any acute fracture.  Plan for surgery at [facility] delayed secondary to anemia versus patient with weight loss goals.  Continue with pain control.  PT/OT evaluation.  Followed  by [facility] for hip replacement. Anticipate d/c to SNF tomorrow.       -Type 2 diabetes mellitus.  A1c 7.3.  Accu-Chek ACH S.  Diabetic diet.  Insulin sliding scale.      -Anemia.  History of GERD.  History of angiodysplasia.  Hb at 10.  Continue with iron supplement.  Monitor H&H.      -Obesity.  BMI 39.  Nutrition consult.      -CKD stage IIIa.  Stable.  Avoid nephrotoxic medication.  Renally dose all medications.      -Paroxysmal A. fib.  CHADs2-VASC 7. Rate controlled continue with Xarelto/atenolol/cardizem.      30.0 - 39.9 Obese /  Body mass index is 39.16 kg/m??.      Estimated discharge date: August 10   Barriers:      Code  status: Full   Prophylaxis: Coumadin   Recommended Disposition: HH PT, OT, RN          Subjective:        Chief Complaint / Reason for Physician Visit   " No acute event.  Continues to have persistent bilateral hip pain.     Appetite at baseline.  .".  Discussed with RN events overnight.       Review of Systems:           Symptom  Y/N  Comments    Symptom  Y/N  Comments             Fever/Chills  n      Chest Pain  n               Poor Appetite  n      Edema  n  Cough        Abdominal Pain  n       Sputum  n      Joint Pain  y       SOB/DOE  n      Pruritis/Rash         Nausea/vomit  n      Tolerating PT/OT  y       Diarrhea        Tolerating Diet                 Constipation        Other               Could NOT obtain due to:            Objective:        VITALS:    Last 24hrs VS reviewed since prior progress note. Most recent are:   Patient Vitals for the past 24 hrs:            Temp  Pulse  Resp  BP  SpO2            07/26/21 0855  --  --  --  --  95 %            07/26/21 0727  98.2 ??F (36.8 ??C)  80  20  126/72  96 %     07/26/21 0642  98.4 ??F (36.9 ??C)  94  20  139/66  96 %     07/26/21 0002  100.2 ??F (37.9 ??C)  74  20  (!) 123/57  92 %     07/25/21 2138  (!) 100.5 ??F (38.1 ??C)  91  20  133/73  91 %            07/25/21 1502  (!) 100.8 ??F (38.2 ??C)  74  20  (!) 154/72  95 %           Intake/Output Summary (Last 24 hours) at 07/26/2021 1353   Last data filed at 07/26/2021 1200     Gross per 24 hour        Intake  600 ml        Output  800 ml        Net  -200 ml            I had a face to face encounter and independently examined this patient on 07/26/2021, as outlined below:   PHYSICAL EXAM:   General: WD, WN. Alert, cooperative, no acute distress     EENT:  EOMI. Anicteric sclerae. MMM   Resp:  CTA bilaterally, no wheezing or rales.  No accessory muscle use   CV:  Regular  rhythm,  No edema   GI:  Soft, Non distended, Non tender.  +Bowel sounds   Neurologic:  Alert and oriented X 3, normal speech,     Psych:   Good insight. Not anxious nor agitated   Skin:  No rashes.  No jaundice      Reviewed most current lab test results and cultures  YES   Reviewed most current radiology test results   YES   Review and summation of old records today    NO   Reviewed patient's current orders and MAR    YES   PMH/SH reviewed - no change compared to H&P   ________________________________________________________________________   Care Plan discussed with:           Comments  Patient  x           Family   x           RN         Care Manager             Consultant                                  Multidiciplinary team rounds were held today with case manager, nursing, pharmacist and Higher education careers adviser.  Patient's plan of care was discussed; medications  were reviewed and discharge planning was addressed.       ________________________________________________________________________   Total NON critical care TIME:  35   Minutes      Total CRITICAL CARE TIME Spent:   Minutes non procedure based              Comments         >50% of visit spent in counseling and coordination of care         ________________________________________________________________________   Denice Paradise, MD       Procedures: see electronic medical records for all procedures/Xrays and details which were not copied into this note but were reviewed prior to creation of Plan.        LABS:   I reviewed today's most current labs and imaging studies.   Pertinent labs include:     Recent Labs            07/26/21   0559  07/23/21   1743     WBC  7.5  8.4     HGB  9.7*  10.3*     HCT  30.6*  32.7*         PLT  170  197          Recent Labs             07/26/21   0559  07/24/21   1308  07/23/21   1743     NA  138   --   138     K  4.4   --   4.3     CL  103   --   104     CO2  26   --   26     GLU  124*   --   184*     BUN  16   --   17     CREA  1.17*   --   1.27*     CA  8.5   --   8.5     MG   --   1.5*   --      PHOS   --   2.8   --      ALB   --    --    2.9*     TBILI   --    --   0.4     ALT   --    --   13          INR   --    --   1.3*           Signed: Denice Paradise, MD

## 2021-07-26 NOTE — Progress Notes (Signed)
Problem: Gas Exchange - Impaired  Goal: Promote optimal lung function  Outcome: Progressing Towards Goal     Problem: Breathing Pattern - Ineffective  Goal: Ability to achieve and maintain a regular respiratory rate  Outcome: Progressing Towards Goal     Problem: Airway Clearance - Ineffective  Goal: Able to cough effectively  Outcome: Progressing Towards Goal  Goal: Absence of airway secretions  Outcome: Progressing Towards Goal  Goal: Lung sounds clear or within normal limits for patient  Outcome: Progressing Towards Goal     Problem: Bronchodilation Therapy  Goal: Able to breathe comfortably (Bronchodilation Therapy)  Outcome: Progressing Towards Goal

## 2021-07-26 NOTE — Progress Notes (Signed)
Problem: Pressure Injury - Risk of  Goal: *Prevention of pressure injury  Description: Document Braden Scale and appropriate interventions in the flowsheet.  Outcome: Progressing Towards Goal  Note: Pressure Injury Interventions:  Sensory Interventions: Float heels, Keep linens dry and wrinkle-free, Minimize linen layers, Assess changes in LOC, Check visual cues for pain    Moisture Interventions: Absorbent underpads, Apply protective barrier, creams and emollients, Internal/External urinary devices    Activity Interventions: Increase time out of bed    Mobility Interventions: HOB 30 degrees or less, Float heels, Turn and reposition approx. every two hours(pillow and wedges)    Nutrition Interventions: Document food/fluid/supplement intake    Friction and Shear Interventions: HOB 30 degrees or less, Apply protective barrier, creams and emollients

## 2021-07-27 LAB — LACTIC ACID
Lactic Acid: 0.8 MMOL/L (ref 0.4–2.0)
Lactic acid: 0.8 MMOL/L (ref 0.4–2.0)

## 2021-07-27 LAB — POCT GLUCOSE
POC Glucose: 150 mg/dL — ABNORMAL HIGH (ref 65–117)
POC Glucose: 151 mg/dL — ABNORMAL HIGH (ref 65–117)

## 2021-07-27 LAB — GLUCOSE, POC
Glucose (POC): 150 mg/dL — ABNORMAL HIGH (ref 65–117)
Glucose (POC): 151 mg/dL — ABNORMAL HIGH (ref 65–117)

## 2021-07-27 LAB — SAMPLES BEING HELD

## 2021-07-27 MED ORDER — SODIUM CHLORIDE 0.9 % IV
100 mg iron/5 mL | Freq: Once | INTRAVENOUS | Status: AC
Start: 2021-07-27 — End: 2021-07-27
  Administered 2021-07-27: 20:00:00 via INTRAVENOUS

## 2021-07-27 MED ORDER — ACETAMINOPHEN 325 MG TABLET
325 mg | ORAL | Status: DC | PRN
Start: 2021-07-27 — End: 2021-07-28
  Administered 2021-07-27 (×2): via ORAL

## 2021-07-27 MED ORDER — FERROUS SULFATE 324 MG (65 MG IRON) TAB, DELAYED RELEASE
324 mg (65 mg iron) | Freq: Every day | ORAL | Status: DC
Start: 2021-07-27 — End: 2021-07-28
  Administered 2021-07-27 – 2021-07-28 (×2): via ORAL

## 2021-07-27 MED FILL — PRAVASTATIN 20 MG TAB: 20 mg | ORAL | Qty: 2

## 2021-07-27 MED FILL — ATENOLOL 25 MG TAB: 25 mg | ORAL | Qty: 2

## 2021-07-27 MED FILL — ALLOPURINOL 100 MG TAB: 100 mg | ORAL | Qty: 1

## 2021-07-27 MED FILL — XARELTO 15 MG TABLET: 15 mg | ORAL | Qty: 1

## 2021-07-27 MED FILL — SPIRONOLACTONE 25 MG TAB: 25 mg | ORAL | Qty: 1

## 2021-07-27 MED FILL — HYDROCODONE-ACETAMINOPHEN 5 MG-325 MG TAB: 5-325 mg | ORAL | Qty: 1

## 2021-07-27 MED FILL — MAGNESIUM OXIDE 400 MG TAB: 400 mg | ORAL | Qty: 1

## 2021-07-27 MED FILL — GABAPENTIN 100 MG CAP: 100 mg | ORAL | Qty: 1

## 2021-07-27 MED FILL — DILTIAZEM ER 120 MG 24 HR CAP: 120 mg | ORAL | Qty: 1

## 2021-07-27 MED FILL — PANTOPRAZOLE 40 MG TAB, DELAYED RELEASE: 40 mg | ORAL | Qty: 1

## 2021-07-27 MED FILL — CLOPIDOGREL 75 MG TAB: 75 mg | ORAL | Qty: 1

## 2021-07-27 MED FILL — LIDOCAINE PAIN RELIEF 4 % TOPICAL PATCH: 4 % | CUTANEOUS | Qty: 2

## 2021-07-27 MED FILL — ENOXAPARIN 30 MG/0.3 ML SUB-Q SYRINGE: 30 mg/0.3 mL | SUBCUTANEOUS | Qty: 0.3

## 2021-07-27 MED FILL — VENOFER 100 MG IRON/5 ML INTRAVENOUS SOLUTION: 100 mg iron/5 mL | INTRAVENOUS | Qty: 15

## 2021-07-27 MED FILL — FERROUS SULFATE 324 MG (65 MG IRON) TAB, DELAYED RELEASE: 324 mg (65 mg iron) | ORAL | Qty: 1

## 2021-07-27 MED FILL — ACETAMINOPHEN 325 MG TABLET: 325 mg | ORAL | Qty: 2

## 2021-07-27 MED FILL — MONTELUKAST 10 MG TAB: 10 mg | ORAL | Qty: 1

## 2021-07-27 MED FILL — SERTRALINE 50 MG TAB: 50 mg | ORAL | Qty: 1

## 2021-07-27 NOTE — Progress Notes (Signed)
Medicare pt has received, reviewed, and signed 2nd IM letter informing them of their right to appeal the discharge.  Signed copy has been placed on pt bedside chart.

## 2021-07-27 NOTE — Progress Notes (Signed)
IDR Team; MD, Nursing, Care Manager, ,Pharmacy, Nursing Supervisor, met to review patient's plan of care. Discussed goals, interventions, barriers and progress.     RUR: 14%  LOW    Team will continue to monitor progress and report any concerns to the physician and care management as indicated.    Transition of Care Plan:    Patient encouraged to work with therapy as much as she can. She is waiting for surgery at [facility] for Total Hip Replacement but can not have it until her iron level is better according to the patient. Patient will be eligible for skilled care services on 07/28/21. Medicare pt has received, reviewed, and signed 2nd IM letter informing her of her right to appeal the discharge.  Signed copied has been placed on pt bedside chart. Copies given to patient.

## 2021-07-27 NOTE — Progress Notes (Signed)
Progress Notes by Denice Paradise, MD at 07/27/21 1319                Author: Denice Paradise, MD  Service: Hospitalist  Author Type: Physician       Filed: 07/27/21 1320  Date of Service: 07/27/21 1319  Status: Signed          Editor: Denice Paradise, MD (Physician)                       Hospitalist Progress Note      NAME: Michele Mejia    DOB:  1941/11/07    MRN:  [id number]          Assessment / Plan:        80 year old female with past medical history of advanced osteoarthritis of the hip, type 2 diabetes mellitus, OSA, COPD, GI bleed, obesity, dyslipidemia, TIA, CKD stage IIIa, paroxysmal A. fib on anticoagulation presented with fall and not able to do  weightbearing for further evaluation.      # Advanced bilateral hip osteoarthritis.  Plan for hip replacement at [facility].   #Type 2 diabetes mellitus.   #Morbid obesity BMI 39.16.   #Hypertension.   #Obstructive sleep apnea.  COPD.   #GI bleeding history.  Angiodysplasia.  GERD.   #Hyperlipidemia.   #CKD stage IIIa.   #Paroxysmal A. fib.  On anticoagulation.   #History of TIAs.         -S/p mechanical fall.  Advanced osteoarthritis of the hip.  Imaging negative for any acute fracture.  Plan for surgery at [facility] delayed secondary to anemia versus patient with weight loss goals.  Continue with pain control.  PT/OT evaluation.  Followed  by [facility] for hip replacement. Anticipate d/c to SNF am.       -Type 2 diabetes mellitus.  A1c 7.3.  Accu-Chek ACH S.  Diabetic diet.  Insulin sliding scale.      -Anemia.  History of GERD.  History of angiodysplasia.  Hb at 10.  Continue with iron supplement.  Monitor H&H. IV infusion of iron today.       -Obesity.  BMI 39.  Nutrition consult.      -CKD stage IIIa.  Stable.  Avoid nephrotoxic medication.  Renally dose all medications.      -Paroxysmal A. fib.  CHADs2-VASC 7. Rate controlled continue with Xarelto/atenolol/cardizem.      30.0 - 39.9 Obese /  Body mass index is 39.16 kg/m??.      Estimated discharge date: August  10   Barriers:      Code status: Full   Prophylaxis: Coumadin   Recommended Disposition: HH PT, OT, RN          Subjective:        Chief Complaint / Reason for Physician Visit   " No acute event.     Continues to have persistent bilateral hip pain.     Appetite at baseline. ".        Discussed with RN events overnight.       Review of Systems:           Symptom  Y/N  Comments    Symptom  Y/N  Comments             Fever/Chills  n      Chest Pain  n               Poor Appetite  n  Edema  n               Cough        Abdominal Pain  n       Sputum  n      Joint Pain  y       SOB/DOE  n      Pruritis/Rash         Nausea/vomit  n      Tolerating PT/OT  y       Diarrhea        Tolerating Diet                 Constipation        Other               Could NOT obtain due to:            Objective:        VITALS:    Last 24hrs VS reviewed since prior progress note. Most recent are:   Patient Vitals for the past 24 hrs:            Temp  Pulse  Resp  BP  SpO2            07/27/21 0800  98 ??F (36.7 ??C)  66  20  125/75  93 %            07/27/21 0708  97.5 ??F (36.4 ??C)  68  20  121/70  92 %     07/27/21 0150  99.6 ??F (37.6 ??C)  63  20  (!) 118/59  92 %     07/26/21 2348  99.5 ??F (37.5 ??C)  63  20  (!) 114/56  94 %     07/26/21 2237  --  --  --  --  93 %     07/26/21 2128  --  --  --  --  97 %     07/26/21 2124  (!) 101.2 ??F (38.4 ??C)  66  20  130/66  95 %            07/26/21 1512  --  80  --  (!) 153/74  --           Intake/Output Summary (Last 24 hours) at 07/27/2021 1319   Last data filed at 07/27/2021 3329     Gross per 24 hour        Intake  240 ml        Output  500 ml        Net  -260 ml            I had a face to face encounter and independently examined this patient on 07/27/2021, as outlined below:   PHYSICAL EXAM:   General: WD, WN. Alert, cooperative, no acute distress     EENT:  EOMI. Anicteric sclerae. MMM   Resp:  CTA bilaterally, no wheezing or rales.  No accessory muscle use   CV:  Regular  rhythm,  No edema    GI:  Soft, Non distended, Non tender.  +Bowel sounds   Neurologic:  Alert and oriented X 3, normal speech,    Psych:   Good insight. Not anxious nor agitated   Skin:  No rashes.  No jaundice      Reviewed most current lab test results and cultures  YES   Reviewed most current radiology test results   YES   Review and summation  of old records today    NO   Reviewed patient's current orders and MAR    YES   PMH/SH reviewed - no change compared to H&P   ________________________________________________________________________   Care Plan discussed with:           Comments         Patient  x           Family   x           RN         Care Manager             Consultant                                  Multidiciplinary team rounds were held today with case manager, nursing, pharmacist and clinical coordinator.  Patient's plan of care was discussed; medications  were reviewed and discharge planning was addressed.       ________________________________________________________________________   Total NON critical care TIME:  35   Minutes      Total CRITICAL CARE TIME Spent:   Minutes non procedure based              Comments         >50% of visit spent in counseling and coordination of care         ________________________________________________________________________   Denice Paradise, MD       Procedures: see electronic medical records for all procedures/Xrays and details which were not copied into this note but were reviewed prior to creation of Plan.        LABS:   I reviewed today's most current labs and imaging studies.   Pertinent labs include:     Recent Labs           07/26/21   0559     WBC  7.5     HGB  9.7*     HCT  30.6*        PLT  170          Recent Labs           07/26/21   0559     NA  138     K  4.4     CL  103     CO2  26     GLU  124*     BUN  16     CREA  1.17*        CA  8.5           Signed: Denice Paradise, MD

## 2021-07-27 NOTE — Progress Notes (Signed)
Problem: Pressure Injury - Risk of  Goal: *Prevention of pressure injury  Description: Document Braden Scale and appropriate interventions in the flowsheet.  Outcome: Progressing Towards Goal  Note: Pressure Injury Interventions:  Sensory Interventions: Assess changes in LOC, Check visual cues for pain    Moisture Interventions: Absorbent underpads, Internal/External urinary devices    Activity Interventions: Increase time out of bed, PT/OT evaluation    Mobility Interventions: HOB 30 degrees or less, Float heels    Nutrition Interventions: Document food/fluid/supplement intake    Friction and Shear Interventions: HOB 30 degrees or less                Problem: Gas Exchange - Impaired  Goal: Promote optimal lung function  Outcome: Progressing Towards Goal     Problem: Breathing Pattern - Ineffective  Goal: Ability to achieve and maintain a regular respiratory rate  Outcome: Progressing Towards Goal     Problem: Body Temperature -  Risk of, Imbalanced  Goal: Ability to maintain a body temperature within defined limits  Outcome: Progressing Towards Goal  Goal: Will regain or maintain usual level of consciousness  Outcome: Progressing Towards Goal  Goal: Complications related to the disease process, condition or treatment will be avoided or minimized  Outcome: Progressing Towards Goal     Problem: Isolation Precautions - Risk of Spread of Infection  Goal: Prevent transmission of infectious organism to others  Outcome: Progressing Towards Goal     Problem: Risk for Fluid Volume Deficit  Goal: Maintain normal heart rhythm  Outcome: Progressing Towards Goal  Goal: Maintain absence of muscle cramping  Outcome: Progressing Towards Goal  Goal: Maintain normal serum potassium, sodium, calcium, phosphorus, and pH  Outcome: Progressing Towards Goal     Problem: Loneliness or Risk for Loneliness  Goal: Demonstrate positive use of time alone when socialization is not possible  Outcome: Progressing Towards Goal     Problem:  Fatigue  Goal: Verbalize increase energy and improved vitality  Outcome: Progressing Towards Goal     Problem: Falls - Risk of  Goal: *Absence of Falls  Description: Document Schmid Fall Risk and appropriate interventions in the flowsheet.  Outcome: Progressing Towards Goal  Note: Fall Risk Interventions:  Mobility Interventions: PT Consult for mobility concerns, Utilize walker, cane, or other assistive device         Medication Interventions: Patient to call before getting OOB    Elimination Interventions: Bed/chair exit alarm, Call light in reach, Patient to call for help with toileting needs    History of Falls Interventions: Bed/chair exit alarm, Door open when patient unattended         Problem: Diabetes Self-Management  Goal: *Disease process and treatment process  Description: Define diabetes and identify own type of diabetes; list 3 options for treating diabetes.  Outcome: Progressing Towards Goal

## 2021-07-27 NOTE — Progress Notes (Signed)
Pt has an allyven to her buttocks. I have not observed the skin beneath it as pt cannot turn over far enough for me to remove the dressing. Turned at least Q2 hours today.Pt does not tolerate increased activity well.

## 2021-07-27 NOTE — Progress Notes (Signed)
Problem: Pressure Injury - Risk of  Goal: *Prevention of pressure injury  Description: Document Braden Scale and appropriate interventions in the flowsheet.  Outcome: Progressing Towards Goal  Note: Pressure Injury Interventions:  Sensory Interventions: Assess changes in LOC    Moisture Interventions: Absorbent underpads    Activity Interventions: Assess need for specialty bed    Mobility Interventions: HOB 30 degrees or less    Nutrition Interventions: Document food/fluid/supplement intake    Friction and Shear Interventions: HOB 30 degrees or less                Problem: Patient Education: Go to Patient Education Activity  Goal: Patient/Family Education  Outcome: Progressing Towards Goal     Problem: Journalist, newspaper - Impaired  Goal: Promote optimal lung function  Outcome: Progressing Towards Goal     Problem: Breathing Pattern - Ineffective  Goal: Ability to achieve and maintain a regular respiratory rate  Outcome: Progressing Towards Goal     Problem: Body Temperature -  Risk of, Imbalanced  Goal: Ability to maintain a body temperature within defined limits  Outcome: Progressing Towards Goal  Goal: Complications related to the disease process, condition or treatment will be avoided or minimized  Outcome: Progressing Towards Goal     Problem: Loneliness or Risk for Loneliness  Goal: Demonstrate positive use of time alone when socialization is not possible  Outcome: Progressing Towards Goal     Problem: Risk for Fluid Volume Deficit  Goal: Maintain normal heart rhythm  Outcome: Progressing Towards Goal  Goal: Maintain absence of muscle cramping  Outcome: Progressing Towards Goal  Goal: Maintain normal serum potassium, sodium, calcium, phosphorus, and pH  Outcome: Progressing Towards Goal     Problem: Nutrition Deficits  Goal: Optimize nutrtional status  Outcome: Progressing Towards Goal

## 2021-07-27 NOTE — Progress Notes (Signed)
 Problem: Self Care Deficits Care Plan (Adult)  Goal: *Acute Goals and Plan of Care (Insert Text)  07/27/2021 1555 by Rollene Norris, OT  Outcome: Progressing Towards Goal  07/27/2021 1550 by Rollene Norris, OT  Note: FUNCTIONAL STATUS PRIOR TO ADMISSION: The patient was functional at the wheelchair level and required minimal assistance for transfers to the chair.     HOME SUPPORT: The patient lived alone with HHA 6 hr/ daily  to provide assistance.    Occupational Therapy Goals  Initiated 07/27/2021  1.  Patient will perform upper body dressing with supervision/set-up within 7 day(s).  2.  Patient will perform bathing with minimal assistance/contact guard assist within 7 day(s).  3.  Patient will perform lower body dressing with minimal assistance/contact guard assist within 7 day(s).  4.  Patient will perform toilet transfers with minimal assistance/contact guard assist within 7 day(s).  5.  Patient will perform all aspects of toileting with minimal assistance/contact guard assist within 7 day(s).  6.  Patient will participate in upper extremity therapeutic exercise/activities with modified independence for 8    minutes within 7 day(s).    7.  Patient will utilize energy conservation techniques during functional activities with verbal cues within 7 day(s).     OCCUPATIONAL THERAPY EVALUATION  Patient: Michele Mejia (80 y.o. 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 Pt has pain in L forearm from using UE to help with LLE. Had  R Hip replacement in 2015. Pt will benefit from OT to increase UE strength and mobility  as well has participating seated self-care.    Current Level of Function Impacting Discharge (ADLs/self-care): total care- mod A    Functional Outcome Measure:  The patient scored Total: 30/100 on the Barthel Index outcome measure which is indicative of being very depepndent in basic self-care.       Other factors to consider for discharge: lives alone with 6 hr of HHA, does not drive or do IADLs     Patient will benefit from skilled therapy intervention to address the above noted impairments.       PLAN :  Recommendations and Planned Interventions: self care training, functional mobility training, therapeutic exercise, endurance activities, patient education, and home safety training    Frequency/Duration: Patient will be followed by occupational therapy 3 -5 times a week/ 1-2 times A DAY to address goals.    Recommendation for discharge: (in order for the patient to meet his/her long term goals)  Therapy up to 5 days/week in SNF setting    This discharge recommendation:  Has not yet been discussed the attending provider and/or case management    IF patient discharges home will need the following DME: bedside commode, hospital bed, shower chair, and transfer bench       SUBJECTIVE:   Patient stated "i'M FEELING  A BIT BETTER BUT MY L HIP.and forearm hurt    OBJECTIVE DATA SUMMARY:   HISTORY:   Past Medical History:   Diagnosis Date    Anemia     Asthma     Change in  bowel habits     CKD (chronic kidney disease)     COPD (chronic obstructive pulmonary disease) (HCC)     Depression     DJD (degenerative joint disease)     GERD (gastroesophageal reflux disease)     Glaucoma     Gout     HCVD (hypertensive cardiovascular disease)     Obesity     OSA (obstructive sleep apnea)     PAF (paroxysmal atrial fibrillation) (HCC) 12/08/2020    Rectal bleed     Ringing in ear     TIA (transient ischemic attack)      Past Surgical History:   Procedure Laterality Date    HX BREAST BIOPSY Bilateral     neg    HX BREAST REDUCTION       HX CATARACT REMOVAL      HX CHOLECYSTECTOMY  2016    HX COLONOSCOPY  6.2014    HX ENDOSCOPY  6.2014    chronic gastritis    HX ENDOSCOPY  12/2014    gastritis    HX HEART CATHETERIZATION  09/2009    EF 50%, Normal Coronaries    HX HIP REPLACEMENT Right     HX HYSTERECTOMY         Expanded or extensive additional review of patient history:     Home Situation  Home Environment: Private residence  # Steps to Enter: 0  Wheelchair Ramp: Yes  One/Two Story Residence: One story  Living Alone: Yes  Support Systems: Radiographer, therapeutic (6hrs/daily)  Patient Expects to be Discharged to:: Home with home health  Current DME Used/Available at Home: Environmental consultant, rollator  Tub or Shower Type: Tub/Shower combination (cannot use/ uses rolator to get to sink and does spong bath with help)    Hand dominance: Right    EXAMINATION OF PERFORMANCE DEFICITS:  Cognitive/Behavioral Status:alert and orientedx3    Skin: R buttock has patch for wound    Edema: not noted    Hearing:  Auditory  Auditory Impairment: None    Vision/Perceptual:        Corrective Lenses: Reading glasses    Range of Motion:  AROM: Grossly decreased, non-functional (LLE secondary to pain--pain for hip repalcement at [facility])     Strength:    Strength: Grossly decreased, non-functional  Coordination:  Coordination: Within functional limits  Fine Motor Skills-Upper: Right Intact;Left Intact    Gross Motor Skills-Upper: Right Intact;Left Intact    Tone & Sensation:    Tone: Normal  Sensation: Intact    Balance:  Sitting: Intact;With support    Functional Mobility and Transfers for ADLs:  Bed Mobility:  Rolling: Moderate assistance;Adaptive equipment;Additional time;Assist x1;Bed Modified  Scooting: Moderate assistance;Maximum assistance;Additional time    Transfers:   Total or Max a    ADL Assessment:  Feeding: Modified independent;Stand-by assistance    Oral Facial Hygiene/Grooming: Modified Independent;Setup    Bathing: Maximum assistance    Type of Bath:  Basin/Soap/Water    Upper Body Dressing: Minimum assistance;Setup;Additional time    Lower Body Dressing: Maximum assistance;Assist x1;Adaptive equipment    ADL Intervention and task modifications:     Type of Bath: Basin/Soap/Water      Upper Body Dressing Assistance  Hospital Gown: Minimum  assistance;Contact guard assistance    Lower Body Dressing Assistance  Socks: Total assistance (dependent)  Therapeutic Exercise:  UE exercise started   Functional Measure:    Barthel Index:  Bathing: 0  Bladder: 5  Bowels: 5  Grooming: 5  Dressing: 5  Feeding: 10  Mobility: 0  Stairs: 0  Toilet Use: 0  Transfer (Bed to Chair and Back): 0  Total: 30/100      The Barthel ADL Index: Guidelines  1. The index should be used as a record of what a patient does, not as a record of what a patient could do.  2. The main aim is to establish degree of independence from any help, physical or verbal, however minor and for whatever reason.  3. The need for supervision renders the patient not independent.  4. A patient's performance should be established using the best available evidence. Asking the patient, friends/relatives and nurses are the usual sources, but direct observation and common sense are also important. However direct testing is not needed.  5. Usually the patient's performance over the preceding 24-48 hours is important, but occasionally longer periods will be relevant.  6. Middle categories imply that the patient supplies over 50 per cent of the effort.  7. Use of aids to be independent is allowed.    Score Interpretation (from Sinoff 1997)   80-100 Independent   60-79 Minimally independent   40-59 Partially dependent   20-39 Very dependent   <20 Totally dependent     -Mahoney, F.l., Barthel, D.W. (1965). Functional evaluation: the Barthel Index. [place] [address] Med J (14)2.  -Sinoff, G., Ore, L. (1997). The Barthel activities of daily living index: self-reporting versus actual performance in the old (> or = 75 years). Journal of  American Geriatric Society 45(7), [phone].   -Fleeta cotton [place], J.J.M.F, Orman ROES., Oneita MERYL Sebastian DELORSE. (1999). Measuring the change in disability after inpatient rehabilitation; comparison of the responsiveness of the Barthel Index and Functional Independence Measure. Journal of Neurology, Neurosurgery, and Psychiatry, 66(4), [phone].  Marea Chillington, N.J.A, Scholte op Rawlins,  W.J.M, & Koopmanschap, M.A. (2004) Assessment of post-stroke quality of life in cost-effectiveness studies: The usefulness of the Barthel Index and the EuroQoL-5D. Quality of Life Research, 52, 572-56     Occupational Therapy Evaluation Charge Determination   History Examination Decision-Making   MEDIUM Complexity : Expanded review of history including physical, cognitive and psychosocial  history  LOW Complexity : 1-3 performance deficits relating to physical, cognitive , or psychosocial skils that result in activity limitations and / or participation restrictions  LOW Complexity : No comorbidities that affect functional and no verbal or physical assistance needed to complete eval tasks       Based on the above components, the patient evaluation is determined to be of the following complexity level: LOW   Pain Rating:  7/10 in bed move with movement    Activity Tolerance:   Fair and requires rest breaks    After treatment patient left in no apparent distress:    Supine in bed, Heels elevated for pressure relief, and Patient positioned in R sidelying for pressure relief    COMMUNICATION/EDUCATION:   The patient's plan of care was discussed with: Physical therapist, Registered nurse, and Case management.     Patient/family have participated as able in goal setting and plan of care.    This patient's plan of care is not appropriate for delegation to OTA.    Thank you for this referral.  Almarie Marenisco, OT  Time Calculation: 36 mins

## 2021-07-27 NOTE — Progress Notes (Signed)
Bedside shift change report given to J Pugh RN (oncoming nurse) by Tammy Jenkins, RN  (offgoing nurse). Report included the following information Kardex, Intake/Output, and Recent Results.

## 2021-07-27 NOTE — Progress Notes (Signed)
Problem: Mobility Impaired (Adult and Pediatric)  Goal: *Acute Goals and Plan of Care (Insert Text)  Description: FUNCTIONAL STATUS PRIOR TO ADMISSION: Patient was modified independent using a rollator for functional mobility. Patient reports she is limited to household distance secondary to hip pain (awaiting hip surgery). Has paid caregiver assistance for some LB dressing and bathing activity. Aide assists with transportation to appointments. Use of w/c for community mobility.     HOME SUPPORT PRIOR TO ADMISSION: The patient lived alone with no local support. Has paid caregiver assistance 6 hours/day.     Physical Therapy Goals  Initiated 07/25/2021  1.  Patient will move from supine to sit and sit to supine , scoot up and down, and roll side to side in bed with moderate assistance  within 7 day(s).    2.  Patient will transfer from bed to chair and chair to bed with moderate assistance  using the least restrictive device within 7 day(s).  3.  Patient will perform sit to stand with moderate assistance  within 7 day(s).  4.  Patient will ambulate with moderate assistance  for 50 feet with the least restrictive device within 7 day(s).     Outcome: Progressing Towards Goal   PHYSICAL THERAPY TREATMENT  Patient: Michele Mejia (80 y.o. 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    ASSESSMENT  Patient continues with skilled PT services and is slowly progressing towards goals. Nursing reports that patient is running low-grade fever, but patient still wants to participate in PT treatment. Patient comes to sit with increased time/effort and maximal assist for both LEs, minimal assist for upper body (assist x 2). Patient maintains static sitting at edge of bed with heavy left lean due to right buttock/hip pain for >/=  7 minutes with occasional CGA for balance. She returns to supine with max assist for bilateral LEs and moderate assist for upper body (assist x 2). She complains of pain both hips/knees with all mobility, nursing had medicated patient with Tylenol shortly before mobility attempts. After rolling with maximal assist multiple rep for personal hygiene and scooting up in bed with total assist x 2, patient is left in left sidelying with call bell in reach and nursing present. She is going to be transferred to [facility] for hip surgery and will definitely need SNF Rehab to restore her prior level of function before returning to her home setting.    Current Level of Function Impacting Discharge (mobility/balance): mod-max assist x 2 supine to/from sit, fair to good sitting at edge of bed with left lean    Other factors to consider for discharge: was modified independent with rollator         PLAN :  Patient continues to benefit from skilled intervention to address the above impairments.  Continue treatment per established plan of care.  to address goals.    Recommendation for discharge: (in order for the patient to meet his/her long term goals)  To be determined: [facility] will make needed post-op decisions with patient    This discharge recommendation:  Has been made in collaboration with the attending provider and/or case management    IF patient discharges home will need the following DME: to be determined (TBD)       SUBJECTIVE:   Patient stated ???It's  my right butt that hurts the most.??? Note bandaging at right upper buttock when patient is sitting.    OBJECTIVE DATA SUMMARY:     Functional Mobility Training:  Bed Mobility:  Rolling: Maximum assistance;Additional time;Assist x1;Adaptive equipment  Supine to Sit: Minimum assistance;Maximum assistance;Assist x2  Sit to Supine: Moderate assistance;Maximum assistance;Assist x2  Scooting: Total assistance;Assist x2          Balance:  Sitting: Impaired;Without support  Sitting - Static:  Fair (occasional), >/= 7 minutes at edge of bed without LE support  Sitting - Dynamic: Not tested      Therapeutic Exercises:   AAROM both LEs during mobility training, AROM both UEs  Pain Rating:  7/10 left hip prior to treatment, nursing has medicated patient with Tylenol    Activity Tolerance:   Poor, SpO2 stable on RA, and requires rest breaks    After treatment patient left in no apparent distress:   Patient positioned in left sidelying for pressure relief, Call bell within reach, Caregiver / family present, and Side rails x 3    COMMUNICATION/COLLABORATION:   The patient???s plan of care was discussed with: Occupational therapist and Registered nurse.     Ivor Costa, PT   Time Calculation: 24 mins

## 2021-07-28 ENCOUNTER — Encounter
Admit: 2021-07-28 | Discharge: 2021-08-03 | Disposition: A | Discharge status: Skilled Nursing Facility | Payer: MEDICARE | Source: Skilled Nursing Facility | Attending: Internal Medicine | Admitting: Internal Medicine

## 2021-07-28 DIAGNOSIS — M1612 Unilateral primary osteoarthritis, left hip: Secondary | ICD-10-CM

## 2021-07-28 LAB — POCT GLUCOSE
POC Glucose: 137 mg/dL — ABNORMAL HIGH (ref 65–117)
POC Glucose: 106 mg/dL (ref 65–117)
POC Glucose: 157 mg/dL — ABNORMAL HIGH (ref 65–117)

## 2021-07-28 LAB — GLUCOSE, POC
Glucose (POC): 137 mg/dL — ABNORMAL HIGH (ref 65–117)
Glucose (POC): 106 mg/dL (ref 65–117)
Glucose (POC): 157 mg/dL — ABNORMAL HIGH (ref 65–117)

## 2021-07-28 MED ORDER — HYDROCODONE-ACETAMINOPHEN 5 MG-325 MG TAB
5-325 mg | Freq: Four times a day (QID) | ORAL | Status: DC | PRN
Start: 2021-07-28 — End: 2021-08-03
  Administered 2021-07-29 – 2021-08-02 (×10): via ORAL

## 2021-07-28 MED ORDER — SPIRONOLACTONE 25 MG TAB
25 mg | Freq: Two times a day (BID) | ORAL | Status: DC
Start: 2021-07-28 — End: 2021-08-03
  Administered 2021-07-29 – 2021-08-03 (×11): via ORAL

## 2021-07-28 MED ORDER — PRAVASTATIN 20 MG TAB
20 mg | Freq: Every day | ORAL | Status: DC
Start: 2021-07-28 — End: 2021-08-03
  Administered 2021-07-29 – 2021-08-03 (×6): via ORAL

## 2021-07-28 MED ORDER — FERROUS SULFATE 324 MG (65 MG IRON) TAB, DELAYED RELEASE
324 mg (65 mg iron) | Freq: Every day | ORAL | Status: DC
Start: 2021-07-28 — End: 2021-08-03
  Administered 2021-07-29 – 2021-08-03 (×6): via ORAL

## 2021-07-28 MED ORDER — GLUCOSE 4 GRAM CHEWABLE TAB
4 gram | ORAL | Status: DC | PRN
Start: 2021-07-28 — End: 2021-07-30

## 2021-07-28 MED ORDER — RIVAROXABAN 15 MG TAB
15 mg | Freq: Every day | ORAL | Status: DC
Start: 2021-07-28 — End: 2021-08-03
  Administered 2021-07-29 – 2021-08-03 (×6): via ORAL

## 2021-07-28 MED ORDER — ALBUTEROL SULFATE 0.083 % (0.83 MG/ML) SOLN FOR INHALATION
2.5 mg /3 mL (0.083 %) | Freq: Four times a day (QID) | RESPIRATORY_TRACT | Status: DC | PRN
Start: 2021-07-28 — End: 2021-08-03

## 2021-07-28 MED ORDER — CLOPIDOGREL 75 MG TAB
75 mg | Freq: Every day | ORAL | Status: DC
Start: 2021-07-28 — End: 2021-08-03
  Administered 2021-07-29 – 2021-08-03 (×6): via ORAL

## 2021-07-28 MED ORDER — DILTIAZEM ER 180 MG 24 HR CAP
180 mg | Freq: Every day | ORAL | Status: DC
Start: 2021-07-28 — End: 2021-08-03
  Administered 2021-07-29 – 2021-08-03 (×6): via ORAL

## 2021-07-28 MED ORDER — TIOTROPIUM BROMIDE 18 MCG CAPS WITH INHALATION DEVICE
18 mcg | Freq: Every day | RESPIRATORY_TRACT | Status: DC
Start: 2021-07-28 — End: 2021-08-03
  Administered 2021-07-29 – 2021-08-03 (×7): via RESPIRATORY_TRACT

## 2021-07-28 MED ORDER — SERTRALINE 50 MG TAB
50 mg | Freq: Every day | ORAL | Status: DC
Start: 2021-07-28 — End: 2021-08-03
  Administered 2021-07-29 – 2021-08-03 (×6): via ORAL

## 2021-07-28 MED ORDER — ALPRAZOLAM 0.25 MG TAB
0.25 mg | Freq: Two times a day (BID) | ORAL | Status: DC | PRN
Start: 2021-07-28 — End: 2021-08-03

## 2021-07-28 MED ORDER — BISACODYL 10 MG RECTAL SUPPOSITORY
10 mg | Freq: Every day | RECTAL | Status: DC | PRN
Start: 2021-07-28 — End: 2021-07-28
  Administered 2021-07-28: 21:00:00 via RECTAL

## 2021-07-28 MED ORDER — MOMETASONE-FORMOTEROL HFA 200 MCG-5 MCG/ACTUATION AEROSOL INHALER
200-5 mcg/actuation | Freq: Two times a day (BID) | RESPIRATORY_TRACT | Status: DC
Start: 2021-07-28 — End: 2021-07-28

## 2021-07-28 MED ORDER — LIDOCAINE 4 % TOPICAL PATCH (12 HOUR DURATION)
4 % | CUTANEOUS | Status: DC
Start: 2021-07-28 — End: 2021-08-03

## 2021-07-28 MED ORDER — MAGNESIUM OXIDE 400 MG TAB
400 mg | Freq: Two times a day (BID) | ORAL | Status: DC
Start: 2021-07-28 — End: 2021-08-03
  Administered 2021-07-29 – 2021-08-03 (×11): via ORAL

## 2021-07-28 MED ORDER — PANTOPRAZOLE 40 MG TAB, DELAYED RELEASE
40 mg | Freq: Every day | ORAL | Status: DC
Start: 2021-07-28 — End: 2021-08-03
  Administered 2021-07-29 – 2021-08-03 (×7): via ORAL

## 2021-07-28 MED ORDER — ALLOPURINOL 100 MG TAB
100 mg | Freq: Two times a day (BID) | ORAL | Status: DC
Start: 2021-07-28 — End: 2021-08-03
  Administered 2021-07-29 – 2021-08-03 (×11): via ORAL

## 2021-07-28 MED ORDER — BISACODYL 10 MG RECTAL SUPPOSITORY
10 mg | Freq: Every day | RECTAL | Status: DC | PRN
Start: 2021-07-28 — End: 2021-08-03

## 2021-07-28 MED ORDER — ACETAMINOPHEN 325 MG TABLET
325 mg | ORAL | Status: DC | PRN
Start: 2021-07-28 — End: 2021-08-03
  Administered 2021-07-28 – 2021-08-03 (×2): via ORAL

## 2021-07-28 MED ORDER — GABAPENTIN 100 MG CAP
100 mg | Freq: Three times a day (TID) | ORAL | Status: DC
Start: 2021-07-28 — End: 2021-08-03
  Administered 2021-07-29 – 2021-08-03 (×18): via ORAL

## 2021-07-28 MED ORDER — ATENOLOL 25 MG TAB
25 mg | Freq: Every day | ORAL | Status: DC
Start: 2021-07-28 — End: 2021-08-03
  Administered 2021-07-29 – 2021-08-03 (×6): via ORAL

## 2021-07-28 MED ORDER — MONTELUKAST 10 MG TAB
10 mg | Freq: Every evening | ORAL | Status: DC
Start: 2021-07-28 — End: 2021-08-03
  Administered 2021-07-29 – 2021-08-03 (×6): via ORAL

## 2021-07-28 MED ORDER — ARFORMOTEROL 15 MCG/2 ML NEB SOLUTION
15 mcg/2 mL | Freq: Two times a day (BID) | RESPIRATORY_TRACT | Status: DC
Start: 2021-07-28 — End: 2021-07-28

## 2021-07-28 MED ORDER — GLUCAGON 1 MG INJECTION
1 mg | INTRAMUSCULAR | Status: DC | PRN
Start: 2021-07-28 — End: 2021-07-30

## 2021-07-28 MED ORDER — ALPRAZOLAM 0.25 MG TAB
0.25 mg | Freq: Two times a day (BID) | ORAL | Status: DC | PRN
Start: 2021-07-28 — End: 2021-07-28
  Administered 2021-07-28: 21:00:00 via ORAL

## 2021-07-28 MED ORDER — BUDESONIDE 0.5 MG/2 ML NEB SUSPENSION
0.5 mg/2 mL | Freq: Two times a day (BID) | RESPIRATORY_TRACT | Status: DC
Start: 2021-07-28 — End: 2021-07-28

## 2021-07-28 MED ORDER — ENOXAPARIN 30 MG/0.3 ML SUB-Q SYRINGE
30 mg/0.3 mL | Freq: Two times a day (BID) | SUBCUTANEOUS | Status: DC
Start: 2021-07-28 — End: 2021-07-28

## 2021-07-28 MED ORDER — ONDANSETRON 4 MG TAB, RAPID DISSOLVE
4 mg | Freq: Four times a day (QID) | ORAL | Status: DC | PRN
Start: 2021-07-28 — End: 2021-08-03

## 2021-07-28 MED ORDER — MOMETASONE-FORMOTEROL HFA 200 MCG-5 MCG/ACTUATION AEROSOL INHALER
200-5 mcg/actuation | Freq: Two times a day (BID) | RESPIRATORY_TRACT | Status: DC
Start: 2021-07-28 — End: 2021-08-03
  Administered 2021-07-29 – 2021-08-03 (×13): via RESPIRATORY_TRACT

## 2021-07-28 MED FILL — XARELTO 15 MG TABLET: 15 mg | ORAL | Qty: 1

## 2021-07-28 MED FILL — ALLOPURINOL 100 MG TAB: 100 mg | ORAL | Qty: 1

## 2021-07-28 MED FILL — HYDROCODONE-ACETAMINOPHEN 5 MG-325 MG TAB: 5-325 mg | ORAL | Qty: 1

## 2021-07-28 MED FILL — ENOXAPARIN 30 MG/0.3 ML SUB-Q SYRINGE: 30 mg/0.3 mL | SUBCUTANEOUS | Qty: 0.3

## 2021-07-28 MED FILL — DILTIAZEM ER 120 MG 24 HR CAP: 120 mg | ORAL | Qty: 1

## 2021-07-28 MED FILL — MAGNESIUM OXIDE 400 MG TAB: 400 mg | ORAL | Qty: 1

## 2021-07-28 MED FILL — ALPRAZOLAM 0.25 MG TAB: 0.25 mg | ORAL | Qty: 1

## 2021-07-28 MED FILL — DULERA 200 MCG-5 MCG/ACTUATION HFA AEROSOL INHALER: 200-5 mcg/actuation | RESPIRATORY_TRACT | Qty: 1

## 2021-07-28 MED FILL — GABAPENTIN 100 MG CAP: 100 mg | ORAL | Qty: 1

## 2021-07-28 MED FILL — ARFORMOTEROL 15 MCG/2 ML NEB SOLUTION: 15 mcg/2 mL | RESPIRATORY_TRACT | Qty: 2

## 2021-07-28 MED FILL — PANTOPRAZOLE 40 MG TAB, DELAYED RELEASE: 40 mg | ORAL | Qty: 1

## 2021-07-28 MED FILL — LIDOCAINE PAIN RELIEF 4 % TOPICAL PATCH: 4 % | CUTANEOUS | Qty: 2

## 2021-07-28 MED FILL — SERTRALINE 50 MG TAB: 50 mg | ORAL | Qty: 1

## 2021-07-28 MED FILL — SPIRONOLACTONE 25 MG TAB: 25 mg | ORAL | Qty: 1

## 2021-07-28 MED FILL — ATENOLOL 25 MG TAB: 25 mg | ORAL | Qty: 2

## 2021-07-28 MED FILL — BISACODYL 10 MG RECTAL SUPPOSITORY: 10 mg | RECTAL | Qty: 1

## 2021-07-28 MED FILL — SPIRIVA WITH HANDIHALER 18 MCG AND INHALATION CAPSULES: 18 mcg | RESPIRATORY_TRACT | Qty: 1

## 2021-07-28 MED FILL — FERROUS SULFATE 324 MG (65 MG IRON) TAB, DELAYED RELEASE: 324 mg (65 mg iron) | ORAL | Qty: 1

## 2021-07-28 MED FILL — CLOPIDOGREL 75 MG TAB: 75 mg | ORAL | Qty: 1

## 2021-07-28 MED FILL — MONTELUKAST 10 MG TAB: 10 mg | ORAL | Qty: 1

## 2021-07-28 MED FILL — PRAVASTATIN 20 MG TAB: 20 mg | ORAL | Qty: 2

## 2021-07-28 NOTE — Progress Notes (Signed)
 Transition of Care (TOC) Plan:  Patient is being discharged to skilled care status. Patient opted to remain at [facility] and utilize the [facility] for skilled care services for PT/OT.    RUR: 14%    Transition of Care Plan to SNF/Rehab    SNF/Rehab Transition:  Patient [facility] for [facility].     Communication to Patient/Family:  Met with patient she is agreeable to the transition plan.    Communication to SNF/Rehab:  Bedside RN, Estanislado Agent, has been notified to update the transition plan to the facility   Discharge information has been updated on the AVS.       Nursing Please include all hard scripts for controlled substances, med rec and dc summary, and AVS in packet.     Reviewed and confirmed with facility, [facility], can manage the patient care needs for the following:     Oneil with (X) only those applicable:    Medication:  [x]   Medications will be available at the facility  []   IV Antibiotics   []   Controlled Substance - hard copy to be sent with patient   []   Weekly Labs   Documents:  []  Hard RX  [x]  MAR  []  Kardex  [x]  AVS  [] Transfer Summary  [x] Discharge   Equipment:  []   CPAP/BiPAP  []   Wound Vacuum  []   Foley or Urinary Device  []   PICC/Central Line  []   Nebulizer  []   Ventilator   Treatment:  [x] Isolation (for MRSA, VRE, etc.)COVID  [] Surgical Drain Management  [] Tracheostomy Care  [] Dressing Changes  [] Dialysis with transportation and chair time .  [] PEG Care  [] Oxygen  [] Daily Weights for Heart Failure   Dietary:  [] Any diet limitations  [] Tube Feedings   [] Total Parenteral Management (TPN)   Eligible for Medicaid Long Term Services and Supports  Yes:  [x]  Eligible for medical assistance or will become eligible within 180 days and UAI completed. Already receiving long term care services in the home provided by Medicaid. Screening already done prior to her admission to the hospital.  []  Provider/Patient and/or support system has  requested screening.  []  UAI copy provided to patient or responsible party,   []  UAI unavailable at discharge will send once processed to SNF provider.  []  UAI unavailable at discharged mailed to patient  No:   []  Private pay and is not financially eligible for Medicaid within the next 180 days.  []  Reside out-of-state.  []  A residents of a state owned/operated facility that is licensed  by Department of Behavioral Health and Developmental Services or [facility]  []  Enrollment in [place] hospice services  []  Non [place]  citizen  []  Patient /Family declines to have screening completed or provide financial information for screening     Financial Resources:  Medicaid    []  Initiated and application pending   [x]  Full coverage     Advanced Care Plan:  [] Surrogate Decision Maker of Care  [] POA  [] Communicated Code Status Full)    Other    Care Management Interventions  PCP Verified by CM: Yes Joenathan Flake, NP)  Last Visit to PCP: 06/06/21  Palliative Care Criteria Met (RRAT>21 & CHF Dx)?: No (No MD order for Palliative Care)  Mode of Transport at Discharge: Other (see comment) (POV)  Transition of Care Consult (CM Consult): SNF  MyChart Signup: No  Discharge Durable Medical Equipment: No  Physical Therapy Consult: Yes  Occupational Therapy Consult: Yes  Speech Therapy Consult: No  Support Systems: Child(ren), Caregiver/Home Care Staff  Confirm Follow Up Transport: Other (see comment)  The Plan for Transition of Care is Related to the Following Treatment Goals : Treat hip pain  Veteran Resource Information Provided?: No  Discharge Location  Patient Expects to be Discharged to:: Skilled nursing facility (Patient requested to remain at [facility] and utilize the [facility] for skilled care, plus patient is in isolation for COVID)     Patient has agreed to skilled care at discharge and has decided to remain here in [facility] Bed Program. 3 midnight qualifying stay: 9/9,10,11 and eligible for skilled  care on 07/28/21.   Patient has been made aware of the reimbursement procedure by Medicare (day 1 to day 20, Medicare pays at 100% and day 21 - 100 Medicare pays at 80% which leaves a co pay of $194.50/day that patient or secondary will cover. Explained to her that she had to work with therapy and as long as they see that she is participating and improving, skilled services will continue.

## 2021-07-28 NOTE — Progress Notes (Signed)
Problem: Self Care Deficits Care Plan (Adult)  Goal: *Acute Goals and Plan of Care (Insert Text)  07/28/2021 1645 by Margaretmary Eddy, OT  Note:    Problem: Self Care Deficits Care Plan (Adult)  Goal: *Acute Goals and Plan of Care (Insert Text)  07/27/2021 1555 by Hillard Danker, OT  Outcome: Progressing Towards Goal  07/27/2021 1550 by Hillard Danker, OT  Note: FUNCTIONAL STATUS PRIOR TO ADMISSION: The patient was functional at the wheelchair level and required minimal assistance for transfers to the chair.     HOME SUPPORT: The patient lived alone with HHA 6 hr/ daily  to provide assistance.     Occupational Therapy Goals  Initiated 07/27/2021  1.  Patient will perform upper body dressing with supervision/set-up within 7 day(s).  2.  Patient will perform bathing with minimal assistance/contact guard assist within 7 day(s).  3.  Patient will perform lower body dressing with minimal assistance/contact guard assist within 7 day(s).  4.  Patient will perform toilet transfers with minimal assistance/contact guard assist within 7 day(s).  5.  Patient will perform all aspects of toileting with minimal assistance/contact guard assist within 7 day(s).  6.  Patient will participate in upper extremity therapeutic exercise/activities with modified independence for 8   minutes within 7 day(s).    7.  Patient will utilize energy conservation techniques during functional activities with verbal cues within 7 day(s).      OCCUPATIONAL THERAPY TREATMENT  Patient: Michele Mejia (80 y.o. 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    ASSESSMENT  Patient continues with skilled OT services and is not progressing towards goals.  Pt continues to have high level of pain with all movement in the LLE and also complaining of pain in the RLE. She had R  hip replaced in 2013 by  her report. She is adducted and is tender throughout both legs to touch. She is unable to move legs to EOB or back and needed max assist x 2 for mobility in bed. Unable to roll. Too painful to attempt. Once sitting EOB she was able to let go to brush teeth, wash face. She attempted sit to stand but could not lift her bottom off the bed.       Current Level of Function Impacting Discharge (ADLs): pain prohibits movement and participation in most of therapy activities.  She is unable to stand today.    Other factors to consider for discharge: needs hip replaced         PLAN :  Patient continues to benefit from skilled intervention to address the above impairments.  Continue treatment per established plan of care to address goals.    Recommend with staff: consider putting on an air mattress if she is unable to move to chair    Recommend next OT session: continue to attempt to transfer to EOB with decreased help and encourage ADL participation    Recommendation for discharge: (in order for the patient to meet his/her long term goals)  Therapy up to 5 days/week in SNF setting versus LTAC due to low pain tolerance and impaired participation    This discharge recommendation:  Has been made in collaboration with the attending provider and/or case management    IF patient discharges home will need the following DME: none  SUBJECTIVE:   Patient stated "it's popping.  Re L hip"    OBJECTIVE DATA SUMMARY:   Cognitive/Behavioral Status:            Anxious due to anticipation of pain w/movement          Functional Mobility and Transfers for ADLs:  Bed Mobility:  Rolling: Assist x2;Maximum assistance  Supine to Sit: Assist x2;Maximum assistance  Sit to Supine: Assist x2;Maximum assistance  Scooting: Total assistance;Assist x2    Transfers:  Sit to Stand: Assist x2 (unable to stand)          Balance:  Sitting: Impaired  Sitting - Static: Good (unsupported)  Sitting - Dynamic: Fair  (occasional)  Standing: Impaired  Standing - Static:  (unable to stand)    ADL Intervention:       Grooming  Grooming Assistance: Set-up  Position Performed: Seated edge of bed  Washing Face: Set-up  Washing Hands: Set-up  Brushing Teeth: Set-up                        Lower Body Dressing Assistance  Protective Undergarmet: Total assistance (dependent)  Socks: Total assistance (dependent)    Toileting  Bladder Hygiene: Total assistance (dependent)  Clothing Management: Total assistance (dependent)       Pain:  Significant pain, tears when movement performed    Activity Tolerance:   Poor and requires frequent rest breaks    After treatment patient left in no apparent distress:   Supine in bed, Call bell within reach, and Bed / chair alarm activated    COMMUNICATION/COLLABORATION:   The patient's plan of care was discussed with: Physical therapist and Case management.     Margaretmary Eddy, OT  Time Calculation: 33 mins

## 2021-07-28 NOTE — Progress Notes (Signed)
 Bedside and Verbal shift change report given to J. Pugh,RN (oncoming nurse) by Estanislado JAYSON Agent, RN   (offgoing nurse). Report included the following information SBAR, Kardex, Procedure Summary, Intake/Output, MAR, Accordion, Recent Results, and Med Rec Status.    Schmidt score 3  Bed/chair alarm Yes in use.  If in use it is set at the highest volume.    Intravenous fluids were administered, none   Patient Vitals for the past 12 hrs:   Temp Pulse Resp BP SpO2   07/28/21 2117 97.8 F (36.6 C) 60 20 (!) 140/67 92 %   07/28/21 1845 -- -- -- -- 91 %     No flowsheet data found.   Lab results reviewed. For significant abnormal values and values requiring intervention, see assessment and plan.

## 2021-07-28 NOTE — Progress Notes (Signed)
07/28/21 - clarified with infection control that droplet plus will remain in place for 10 days after testing +Covid (07/23/21 admission)

## 2021-07-28 NOTE — Discharge Summary (Signed)
Discharge Summary by Charlynn Grimes, MD at 07/28/21 1628                Author: Charlynn Grimes, MD  Service: Hospitalist  Author Type: Physician       Filed: 07/30/21 2019  Date of Service: 07/28/21 1628  Status: Addendum          Editor: Charlynn Grimes, MD (Physician)          Related Notes: Original Note by Charlynn Grimes, MD (Physician) filed at 07/30/21 [facility]   Hospitalist Discharge Summary      Patient ID:   Michele Mejia   [id number]   80 y.o.   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 female presenting for admission to [facility] for further evaluation and treatment for <principal problem not specified>.  She  has  a past medical history of Anemia, Asthma, Change in bowel habits, CKD (chronic kidney disease), COPD (chronic obstructive pulmonary disease) ([place]), Depression, DJD (degenerative joint disease), GERD (gastroesophageal reflux disease), Glaucoma, Gout, HCVD  (hypertensive cardiovascular disease), Obesity, OSA (obstructive sleep apnea), PAF (paroxysmal atrial fibrillation) ([place])  (12/08/2020), Rectal bleed, Ringing in ear, and TIA (transient ischemic attack).. She reports the presenting symptoms presented  to the emergency room last night after a fall.  She developed pain in the left hip.  Patient has chronic pain in that hip secondary to arthritis.  She is planning on getting hip replacement at [facility], she states they are delaying surgery until she was  to have an iron infusion today.  Nursing supervisor reports that the surgery was delayed secondary to request for patient to lose weight.  Patient states she has not had success with PT in the past as this is made the pain worse.      ______________________________________________________________________   DISCHARGE SUMMARY/HOSPITAL COURSE:   for full details see H&P, daily progress notes, labs, consult notes.       80 year old female with past medical history of advanced osteoarthritis of the hip, type 2 diabetes mellitus, OSA, COPD, GI bleed, obesity,  dyslipidemia, TIA, CKD stage IIIa, paroxysmal A. fib  on anticoagulation presented with fall and not able to do weightbearing for further evaluation.         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  Patient is followed at [facility] by orthopedics.  Surgery has apparently been delayed due to her iron deficiency and  anemia as well as poor success on weight reduction.  Patient was evaluated by physical and Occupational Therapy.  Therapy was hampered by the severe pain with limited activity levels.  Patient is not able to be managed at home at this time.  Received  iron supplementation orally as well as intravenously with Venofer.  Patient is transferred to skilled rehab for continued efforts at pain management and improved functional status.        OSA (obstructive sleep apnea) ()     Obesity (08/18/2014)   Patient needs  continued efforts for diet and weight reduction.  Activity limited due to her arthritic pains.  BMI is 39.  Patient received nutritional consultation.        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  Oral iron supplementation maintained.  Patient received IV infusion iron.  Gastroesophageal reflux was managed with PPI.        COVID-19 (07/24/2021)   Diagnosed and treated for COVID-19 8/8.   Further functional decline was noted during this acute illness.   Admitted for physical and occupational therapy to improve prove her functional status.   Droplet Plus isolation required limiting rehab as well        T2DM (type 2 diabetes mellitus) ([place]) (03/13/2013)   Hemoglobin A1c fairly well controlled at 7.3.  Monitor blood glucose before meals and at bedtime.  Diabetic education.  Insulin sliding scale.        Dyslipidemia (08/18/2014)   Maintain home treatment with Pravachol 40 mg daily        HCVD (hypertensive cardiovascular disease) ()   Stable on routine treatment with Atenolol, Diltiazem, Aldactone.        CKD (chronic kidney disease) ()   Stage IIIa-stable.  Avoid nephrotoxic medication.  Renally adjust medication doses.        PAF (paroxysmal atrial fibrillation) ([place]) (12/08/2020)   CHADs2-VASC 7.  Rate controlled.  Continue treatment with Xarelto, Atenolol, Cardizem, Aldactone   Symptoms stable.        COPD (chronic obstructive pulmonary disease) (HCC) ()   Stable on routine treatment with Dulera, Spiriva, Albuterol      _______________________________________________________________________   Patient seen and examined by me on discharge day.   Pertinent Findings:   Visit Vitals      BP  128/64        Pulse  78     Temp  98.1 ??F (36.7 ??C)     Resp  20     Ht  '5\' 7"'  (1.702 m)     Wt  113.4 kg (250 lb)     SpO2  92%        BMI  39.16 kg/m??        Gen:  Not in  distress   Chest: Nonlabored respiration, Clear lungs   CVS:   Regular rhythm.  Tr edema   Abd:  Soft, obese, not distended, not tender   Neuro:  Alert, nonfocal, weak      LABS:              07/26/21 06:52  07/26/21 12:09  07/26/21 16:44  07/26/21 22:15  07/27/21 12:15  07/27/21 21:30  07/28/21 08:00              GLU POC  130 (H)  125 (H)  128 (H)  150 (H)  151 (H)  137 (H)  106     (H): Data is abnormally high            07/23/21 17:43  07/26/21 05:59         WBC  8.4  7.5     NRBC  0.0  0.0     RBC  3.40 (L)  3.20 (L)     HGB  10.3 (L)  9.7 (L)     HCT  32.7 (L)  30.6 (L)     MCV  96.2  95.6     MCH  30.3  30.3     MCHC  31.5  31.7     RDW  14.7 (H)  14.4     PLATELET  197  170     MPV  9.4  9.2     NEUTROPHILS  84 (H)       BAND NEUTROPHILS  3       LYMPHOCYTES  5 (L)       MONOCYTES  5       EOSINOPHILS  1           BASOPHILS  2 (H)       (L): Data is abnormally low   (H): Data is abnormally high           07/24/21 01:37        Color  YELLOW/STRAW     Appearance  CLEAR     Specific gravity  1.015     pH (UA)  6.0     Protein  Negative     Glucose  Negative     Ketone  Negative     Blood  Negative     Bilirubin  Negative     Urobilinogen  0.2     Nitrites  Negative        Leukocyte Esterase  Negative             07/23/21 17:43        INR  1.3 (H)        Prothrombin time  12.7 (H)     (H): Data is abnormally high               07/23/21 17:43  07/24/21 13:08  07/25/21 11:45  07/26/21 05:59  07/27/21 06:28            Sodium  138      138       Potassium  4.3      4.4       Chloride  104      103       CO2  26      26       Anion gap  8      9       Glucose  184 (H)  124 (H)       BUN  17      16       Creatinine  1.27 (H)      1.17 (H)       BUN/Creatinine ratio  13      14       Calcium  8.5      8.5       Phosphorus    2.8           Magnesium    1.5 (L)           GFR est non-AA  41 (L)      45 (L)       GFR est AA  49 (L)      54 (L)       Bilirubin, total  0.4             Protein, total  6.7             Albumin  2.9 (L)              Globulin  3.8             A-G Ratio  0.8 (L)             ALT  13             AST  14 (L)             Alk. phosphatase  81             Lactic acid          0.8     Hemoglobin A1c, (calculated)    7.3 (H)           Est. average glucose    163           Iron      39 (L)         TIBC      218 (L)                Iron % saturation      18 (L)         (H): Data is abnormally high   (L): Data is abnormally low      CULTURES   Paired BC  8/11:  NG           07/23/21 17:53        Influenza A by PCR  Not detected     Influenza B by PCR  Not detected        SARS-COV-2  PCR  DETECTED           RADIOLOGY   CT L HIP 8/7:   Diffuse osteopenia limits evaluation.    Bones: Minimal subchondral collapse superior femoral head (5-22), which is not   definitely seen on 02/01/2020 radiographs given differences technique. No malalignment.    Joint fluid: Moderate sized left hip joint effusion    Articulations: Severe left hip joint osteoarthritis. Partially visualized severe   left sacroiliac joint osteoarthritis. Mild pubic symphysis arthropathy    Tendons: No definite full-thickness tendon tear.    Muscles: No intramuscular hematoma. No focal atrophy.    Soft tissues: Scattered atherosclerosis. Diverticulosis coli.    IMPRESSION   Severe left hip joint osteoarthritis. Subchondral femoral head collapse, which   was not definitely seen on 02/01/2020 radiographs given differences in technique,   but is still an age-indeterminate finding  pCXR 8/7:   The lungs are clear. Heart size and mediastinal contours are normal.   No pleural effusion or pneumothorax. Bones are age-appropriate.    IMPRESSION: No acute cardiopulmonary abnormality.      L FEMUR 8/7:   L HIP / PELVIS 8/7:   There is a right hip arthroplasty. There is deformity of the left   femoral head with a foreshortened position of the femoral head compared to the   prior radiograph from February 2021. Lumbar spine hardware is partially imaged.   The pelvis is otherwise intact.    2  views of the left femur demonstrate deformity of the femoral head. No femoral   shaft fracture. There are degenerative changes of the distal femur at the knee   joint. Vascular calcifications are noted throughout the left lower leg.    IMPRESSION   Deformity and flattening of the left femoral head against the   acetabulum. No femoral neck or femoral shaft fracture.   Right hip arthroplasty.      EKG 8/7:  NSR 88 bpm, Increased HR since 09 June 2019   _______________________________________________________________________   DISCHARGE MEDICATIONS:      Current Discharge Medication List               Current Facility-Administered Medications:    ?  bisacodyL (DULCOLAX) suppository 10 mg, 10 mg, Rectal, DAILY PRN, Charlynn Grimes, MD   ?  ALPRAZolam Duanne Moron) tablet 0.25 mg, 0.25 mg, Oral, BID PRN, Charlynn Grimes, MD   ?  ferrous sulfate tablet 324 mg, 1 Tablet, Oral, DAILY WITH BREAKFAST, Kundi, Rizwan A, MD, 324 mg at 07/28/21 0945   ?  acetaminophen (TYLENOL) tablet 650 mg, 650 mg, Oral, Q4H PRN, Tegegn, Nahom, MD, 650 mg at 07/27/21 1545   ?  mometasone-formoterol (DULERA) 223mg-5mcg/puff, 2 Puff, Inhalation, BID RT, SLenis Noon MD, 2 Puff at 07/28/21 01610  ?  lidocaine 4 % patch 2 Patch, 2 Patch, TransDERmal, Q24H, SLenis Noon MD, 2 Patch at 07/27/21 1537   ?  clopidogreL (PLAVIX) tablet 75 mg, 75 mg, Oral, DAILY, SLenis Noon MD, 75 mg at 07/28/21 0944   ?  sertraline (ZOLOFT) tablet 50 mg, 50 mg, Oral, DAILY, SLenis Noon MD, 50 mg at 07/28/21 0946   ?  spironolactone (ALDACTONE) tablet 25 mg, 25 mg, Oral, BID, SLenis Noon MD, 25 mg at 07/28/21 09604  ?  atenoloL (TENORMIN) tablet 50 mg, 50 mg, Oral, DAILY, SLenis Noon MD, 50 mg at 07/28/21 0941   ?  montelukast (SINGULAIR) tablet 10 mg, 10 mg, Oral, QHS, SLenis Noon MD, 10 mg at 07/27/21 2124   ?  tiotropium (SPIRIVA) inhalation capsule 18 mcg, 1 Capsule, Inhalation, DAILY, SLenis Noon  MD, 18 mcg at 07/28/21 05409  ?  pantoprazole (PROTONIX) tablet 40 mg, 40 mg, Oral, DAILY, SLenis Noon MD, 40 mg at 07/28/21 08119  ?  rivaroxaban (XARELTO) tablet 15 mg, 15 mg, Oral, DAILY, SLenis Noon MD, 15 mg at 07/28/21 0900   ?  pravastatin (PRAVACHOL) tablet 40 mg, 40 mg, Oral, DAILY, SLenis Noon MD, 40 mg at 07/28/21 0946   ?  enoxaparin (LOVENOX) injection 30 mg, 30 mg, SubCUTAneous, Q12H, SLenis Noon MD, 30 mg at 07/28/21 01478  ?  glucose chewable tablet 16 g, 4 Tablet, Oral, PRN, SLenis Noon MD   ?  glucagon (GLUCAGEN) injection 1 mg, 1 mg,  IntraMUSCular, PRN, Lenis Noon, MD   ?  albuterol (PROVENTIL HFA, VENTOLIN HFA, PROAIR HFA) inhaler 2 Puff, 2 Puff, Inhalation, Q6H PRN, Lenis Noon, MD, 2 Puff at 07/26/21  2128   ?  magnesium oxide (MAG-OX) tablet 400 mg, 400 mg, Oral, BID, Lenis Noon, MD, 400 mg at 07/28/21 0945   ?  ondansetron (ZOFRAN ODT) tablet 4 mg, 4 mg, Oral, Q6H PRN, Lenis Noon, MD   ?  gabapentin (NEURONTIN) capsule 100 mg, 100 mg, Oral, TID, Lenis Noon, MD, 100 mg at 07/28/21 0951   ?  allopurinoL (ZYLOPRIM) tablet 100 mg, 100 mg, Oral, BID, Lenis Noon, MD, 100 mg at 07/28/21 0940   ?  HYDROcodone-acetaminophen (NORCO) 5-325 mg per tablet 1 Tablet, 1 Tablet, Oral, Q6H PRN, Lenis Noon, MD, 1 Tablet at 07/28/21  0608   ?  dilTIAZem ER (CARDIZEM CD) capsule 300 mg, 300 mg, Oral, DAILY, Lenis Noon, MD, 180 mg at 07/28/21 7672       My Recommended   Diet: Diabetic Cardiac   Activity: Up only with assistance   Wound Care: none   Follow-up labs: routine      ______________________________________________________________________      DISPOSITION:          Home with Family:          Home with HH/PT/OT/RN:          SNF/LTC:  [facility]        SAHR:          OTHER:             Condition at Discharge:  Stable    _____________________________________________________________________   Follow up with:    PCP : Lynett Grimes, NP     Follow-up Information                  Follow up With  Specialties  Details  Why  Contact Info              Burno, Sharrell Ku, NP  Nurse Practitioner  Plan follow up appt follow rehab adm    [address]   [place] [address]   [phone]              Total time in minutes spent coordinating this discharge (includes going over instructions, follow-up, prescriptions, and preparing report for sign off to her PCP) :35 minutes      Signed:   Charlynn Grimes, MD   [facility]   [phone]

## 2021-07-28 NOTE — Progress Notes (Signed)
Problem: Airway Clearance - Ineffective  Goal: Achieve or maintain patent airway  07/28/2021 2226 by Aliene Altes, RN  Outcome: Progressing Towards Goal  07/28/2021 2216 by Aliene Altes, RN  Outcome: Progressing Towards Goal     Problem: Gas Exchange - Impaired  Goal: Absence of hypoxia  Outcome: Progressing Towards Goal     Problem: Breathing Pattern - Ineffective  Goal: Ability to achieve and maintain a regular respiratory rate  07/28/2021 2226 by Aliene Altes, RN  Outcome: Progressing Towards Goal  07/28/2021 2216 by Aliene Altes, RN  Outcome: Progressing Towards Goal     Problem: Body Temperature -  Risk of, Imbalanced  Goal: Ability to maintain a body temperature within defined limits  07/28/2021 2226 by Aliene Altes, RN  Outcome: Progressing Towards Goal  07/28/2021 2216 by Aliene Altes, RN  Outcome: Progressing Towards Goal     Problem: Risk for Fluid Volume Deficit  Goal: Maintain normal heart rhythm  07/28/2021 2226 by Aliene Altes, RN  Outcome: Progressing Towards Goal  07/28/2021 2216 by Aliene Altes, RN  Outcome: Progressing Towards Goal     Problem: Diabetes Self-Management  Goal: *Monitoring blood glucose, interpreting and using results  Description: Identify recommended blood glucose targets  and personal targets.  Outcome: Progressing Towards Goal     Problem: Falls - Risk of  Goal: *Absence of Falls  Description: Document Bridgette Habermann Fall Risk and appropriate interventions in the flowsheet.  Outcome: Progressing Towards Goal  Note: Fall Risk Interventions:  Mobility Interventions: Bed/chair exit alarm, PT Consult for mobility concerns, PT Consult for assist device competence, OT consult for ADLs         Medication Interventions: Bed/chair exit alarm    Elimination Interventions: Bed/chair exit alarm, Call light in reach, Patient to call for help with toileting needs    History of Falls Interventions: Bed/chair exit alarm

## 2021-07-28 NOTE — Progress Notes (Signed)
Care Management Interventions  PCP Verified by CM: Yes Jonelle Sidle, NP)  Palliative Care Criteria Met (RRAT>21 & CHF Dx)?: No (No MD order for Palliative Care)  Transition of Care Consult (CM Consult): Home Health, Long Term Care  Discharge Durable Medical Equipment: No  Physical Therapy Consult: Yes  Occupational Therapy Consult: Yes  Speech Therapy Consult: No  Support Systems: Child(ren), Caregiver/Home Care Staff  Veteran Resource Information Provided?: No  Discharge Location  Patient Expects to be Discharged to:: Home with home health (Personal care also provided by Medicaid.)     RUR 14%  LOW    Patient admitted to Swing Bed Program at [facility] in an attempt to provide PT/OT for strengthening, endurance and transfers.  Patient is in [place] isolation. Patient explained to CM that she is in so much pain that she will do her very best to participate. Met with both PT/OT  explaining and describing what the patient has to go through  to even move in bed. Patient stated that she was ambulating some at home until she started with excruciating pain. She stated that she has been approved to have a total hip replacement at [facility], but only if she can get her iron levels up. She has already had 2 infusions. Will keep check on the levels for iron, contact her personal care aide that also had COVID to see if she has recovered so the patient can be discharged to home. If not patient may have to be referred to a long term care facility until at such time she can have her total joint replacement.  Swing Bed Orientation Packet given to the patient.

## 2021-07-28 NOTE — Progress Notes (Signed)
[facility]    Psychosocial Assessment Swing Bed Resident    1. APPEARANCE  Resident is well groomed    2. SPEECH & COMMUNICATION SKILLS  Resident is able to converse and make needs known    3. SOCIAL STATUS  Resident desires interaction with others and seeks it out    4. EMOTIONAL STATUS  Which does the resident's normal mood convey?  Contentment    5. MENTAL/COGNITIVE STATUS  Does resident understand what is happening to them? YES  Does resident comprehend what is being said to them?  YES  Is there a particular time of day that the resident's comprehension level is better than other times?  No  Is resident's short term memory accurate? YES  Does resident make decisions using objective information? YES  Is resident irrational in decision making? NO    6. CURRENT RELATIONSHIPS  Resident maintains supportive relationship with (state specific individuals & frequency & type of contact): Staff and family    7. PLACEMENT & DISCHARGE  Does resident understand placement? YES  Does the resident understand diagnosis & intended length of placement? YES  Does the resident express concerns regarding previous residence and/or possessions? YES. Patient is aware that she will need caregiver support when she is discharged if she refuses to go to long term care facility.  Is there support (financial & emotional) when possible discharge occurs? YES. We will make sure that the appropriate discharge plan is made for the patient.    8. ADJUSTMENT DIFFICULTIES  Is resident experiencing any difficulty related to depression, loss, lack of control, uneasy feelings, grieving? YES  If yes, explain: Patient is in need of a hip replacement and cant have it done until her iron levels go up. She is in pain.      Adline Mango Date:07/28/2021

## 2021-07-28 NOTE — Progress Notes (Signed)
Problem: Gas Exchange - Impaired  Goal: Promote optimal lung function  Outcome: Progressing Towards Goal     Problem: Breathing Pattern - Ineffective  Goal: Ability to achieve and maintain a regular respiratory rate  Outcome: Progressing Towards Goal     Problem: Airway Clearance - Ineffective  Goal: Able to cough effectively  Outcome: Progressing Towards Goal  Goal: Absence of airway secretions  Outcome: Progressing Towards Goal  Goal: Lung sounds clear or within normal limits for patient  Outcome: Progressing Towards Goal     Problem: Bronchodilation Therapy  Goal: Able to breathe comfortably (Bronchodilation Therapy)  Outcome: Progressing Towards Goal

## 2021-07-28 NOTE — Progress Notes (Signed)
 Problem: Mobility Impaired (Adult and Pediatric)  Goal: *Acute Goals and Plan of Care (Insert Text)  Description: FUNCTIONAL STATUS PRIOR TO ADMISSION: Patient was modified independent using a rollator for functional mobility. Patient reports she is limited to household distance secondary to hip pain (awaiting hip surgery). Has paid caregiver assistance for some LB dressing and bathing activity. Aide assists with transportation to appointments. Use of w/c for community mobility.     HOME SUPPORT PRIOR TO ADMISSION: The patient lived alone with no local support. Has paid caregiver assistance 6 hours/day.     Physical Therapy Goals  Initiated 07/25/2021  1.  Patient will move from supine to sit and sit to supine , scoot up and down, and roll side to side in bed with moderate assistance  within 7 day(s).    2.  Patient will transfer from bed to chair and chair to bed with moderate assistance  using the least restrictive device within 7 day(s).  3.  Patient will perform sit to stand with moderate assistance  within 7 day(s).  4.  Patient will ambulate with moderate assistance  for 50 feet with the least restrictive device within 7 day(s).     Outcome: Progressing Towards Goal   PHYSICAL THERAPY TREATMENT  Patient: Michele Mejia (80 y.o. 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    ASSESSMENT  Patient continues with skilled PT services and is slowly progressing towards goals. Her progress is limited by pain. Patient needed more assist with supine to sit to supine today and had tears in her eyes secondary to pain in hips and knees. Her sitting balance was improved and she was able to sit unsupported and brush her teeth. We attempted standing, patient allowed to pull on stabilized walker but with 2 assists not able  to even clear buttocks off the bed. Patient attempted to scoot to head of bed in sitting position and again unable to perform or assist with this task. Patient mostly limited by pain per her report but it is difficult to assess her hip strength because she is unwilling vs unable to move her hips secondary to pain. Per patient she is to get hip replacement on the left and has had one on the right but c/o pain on that side as well. Once patient back in supine she needed her brief to be changed and it was noted that patients hip adductors were tight bilaterally.    Current Level of Function Impacting Discharge (mobility/balance): 2 assists needed for supine to sit to supine, 1 max assist to roll, total assist to scoot    Other factors to consider for discharge: has 1 assist at home         PLAN :  Patient continues to benefit from skilled intervention to address the above impairments.  Continue treatment per established plan of care.  to address goals.    Recommendation for discharge: (in order for the patient to meet his/her long term goals)  To be determined: Patient might need therapy at LTC before able to get hip replacement    This discharge recommendation:  Has been made in collaboration with the attending provider and/or case management    IF patient discharges home will need the following DME: to be determined (TBD)  SUBJECTIVE:   Patient stated "I have bone on bone in that hip."    OBJECTIVE DATA SUMMARY:   Critical Behavior:      Alert   Follows commands   Functional Mobility Training:  Bed Mobility:  Rolling: Additional time;Maximum assistance  Supine to Sit: Maximum assistance;Assist x2  Sit to Supine: Maximum assistance;Assist x2  Scooting: Total assistance;Assist x2        Transfers:  Sit to Stand: Other (comment) (attempted but patient unable)            Balance:  Sitting: Impaired;Without support  Sitting - Static: Good (unsupported)  Sitting - Dynamic: Fair (occasional)        Therapeutic  Exercises:   Rolling to left and right in supine  Sitting ankle pumps and LAQ x 5 reps  Pain Rating:  Number not given when asked    Activity Tolerance:   Poor and requires rest breaks    After treatment patient left in no apparent distress:   Supine in bed and Call bell within reach    COMMUNICATION/COLLABORATION:   The patient's plan of care was discussed with: Registered nurse and Case management.     Gareld FALCON Rothemich, PT   Time Calculation: 33 mins

## 2021-07-28 NOTE — Progress Notes (Signed)
Bedside shift change report given to T. James RN (oncoming nurse) by J. Pugh RN (offgoing nurse). Report included the following information Kardex.

## 2021-07-28 NOTE — Progress Notes (Signed)
Problem: Airway Clearance - Ineffective  Goal: Achieve or maintain patent airway  Outcome: Progressing Towards Goal     Problem: Breathing Pattern - Ineffective  Goal: Ability to achieve and maintain a regular respiratory rate  Outcome: Progressing Towards Goal     Problem: Body Temperature -  Risk of, Imbalanced  Goal: Ability to maintain a body temperature within defined limits  Outcome: Progressing Towards Goal     Problem: Risk for Fluid Volume Deficit  Goal: Maintain normal heart rhythm  Outcome: Progressing Towards Goal

## 2021-07-29 LAB — POCT GLUCOSE
POC Glucose: 144 mg/dL — ABNORMAL HIGH (ref 65–117)
POC Glucose: 100 mg/dL (ref 65–117)
POC Glucose: 161 mg/dL — ABNORMAL HIGH (ref 65–117)
POC Glucose: 125 mg/dL — ABNORMAL HIGH (ref 65–117)

## 2021-07-29 LAB — GLUCOSE, POC
Glucose (POC): 144 mg/dL — ABNORMAL HIGH (ref 65–117)
Glucose (POC): 100 mg/dL (ref 65–117)
Glucose (POC): 161 mg/dL — ABNORMAL HIGH (ref 65–117)
Glucose (POC): 125 mg/dL — ABNORMAL HIGH (ref 65–117)

## 2021-07-29 MED ORDER — ALBUTEROL SULFATE HFA 90 MCG/ACTUATION AEROSOL INHALER
90 mcg/actuation | Freq: Four times a day (QID) | RESPIRATORY_TRACT | Status: DC
Start: 2021-07-29 — End: 2021-08-03
  Administered 2021-07-29 – 2021-08-03 (×19): via RESPIRATORY_TRACT

## 2021-07-29 MED FILL — ATENOLOL 25 MG TAB: 25 mg | ORAL | Qty: 2

## 2021-07-29 MED FILL — FERROUS SULFATE 324 MG (65 MG IRON) TAB, DELAYED RELEASE: 324 mg (65 mg iron) | ORAL | Qty: 1

## 2021-07-29 MED FILL — SPIRONOLACTONE 25 MG TAB: 25 mg | ORAL | Qty: 1

## 2021-07-29 MED FILL — HYDROCODONE-ACETAMINOPHEN 5 MG-325 MG TAB: 5-325 mg | ORAL | Qty: 1

## 2021-07-29 MED FILL — SERTRALINE 50 MG TAB: 50 mg | ORAL | Qty: 1

## 2021-07-29 MED FILL — GABAPENTIN 100 MG CAP: 100 mg | ORAL | Qty: 1

## 2021-07-29 MED FILL — CLOPIDOGREL 75 MG TAB: 75 mg | ORAL | Qty: 1

## 2021-07-29 MED FILL — LIDOCAINE PAIN RELIEF 4 % TOPICAL PATCH: 4 % | CUTANEOUS | Qty: 2

## 2021-07-29 MED FILL — ALLOPURINOL 100 MG TAB: 100 mg | ORAL | Qty: 1

## 2021-07-29 MED FILL — MONTELUKAST 10 MG TAB: 10 mg | ORAL | Qty: 1

## 2021-07-29 MED FILL — PRAVASTATIN 20 MG TAB: 20 mg | ORAL | Qty: 2

## 2021-07-29 MED FILL — PANTOPRAZOLE 40 MG TAB, DELAYED RELEASE: 40 mg | ORAL | Qty: 1

## 2021-07-29 MED FILL — XARELTO 15 MG TABLET: 15 mg | ORAL | Qty: 1

## 2021-07-29 MED FILL — MAGNESIUM OXIDE 400 MG TAB: 400 mg | ORAL | Qty: 1

## 2021-07-29 MED FILL — DILTIAZEM ER 120 MG 24 HR CAP: 120 mg | ORAL | Qty: 1

## 2021-07-29 NOTE — H&P (Signed)
H&P by Lona Kettlelson, Teliah Buffalo A, MD  at 07/29/21 1847                Author: Lona Kettlelson, Markan Cazarez A, MD  Service: Hospitalist  Author Type: Physician       Filed: 07/30/21 2016  Date of Service: 07/29/21 1847  Status: Signed          Editor: Lona Kettlelson, Alivia Cimino A, MD (Physician)                          [facility]    Admission History & Physical            07/29/2021 6:47 PM   Patient: Michele Mejia 03/31/1941   PCP: Sharlett IlesBurno, [name] Boyd, NP      HISTORY   Chief Complaint: No chief complaint on file.         HPI: 80 y.o. female presenting for [facility] for further evaluation and treatment for  Encounter for rehabilitation.  She  has a past medical history of Anemia, Asthma, Change in bowel habits, CKD (chronic kidney disease), COPD (chronic obstructive pulmonary disease) (HCC), Depression,  DJD (degenerative joint disease), GERD (gastroesophageal reflux disease), Glaucoma, Gout, HCVD (hypertensive cardiovascular disease), Obesity, OSA (obstructive sleep apnea), PAF (paroxysmal atrial fibrillation) (HCC) (12/08/2020), Rectal bleed, Ringing  in ear, and TIA (transient ischemic attack).60.       80 year old female with past medical history of advanced osteoarthritis of the hip, type 2 diabetes mellitus, OSA, COPD, GI bleed, obesity, dyslipidemia, TIA, CKD stage IIIa, paroxysmal A. fib on  anticoagulation presented with recent fall and not being able to do weightbearing.  Dx with COVID 19.  Slow progress due to marked hip pains.    Tx with IV Venofer for Fe Def Anemia during acute care.  Acute care 8/7 till 8/12.  Attempting to improve functional status to prep for [facility] hip surgery.      Past Medical History:     Past Medical History:        Diagnosis  Date         ?  Anemia       ?  Asthma       ?  Change in bowel habits       ?  CKD (chronic kidney disease)       ?  COPD (chronic obstructive pulmonary disease) (HCC)       ?  Depression       ?  DJD (degenerative joint disease)       ?  GERD (gastroesophageal  reflux disease)       ?  Glaucoma       ?  Gout       ?  HCVD (hypertensive cardiovascular disease)       ?  Obesity       ?  OSA (obstructive sleep apnea)       ?  PAF (paroxysmal atrial fibrillation) (HCC)  12/08/2020     ?  Rectal bleed       ?  Ringing in ear           ?  TIA (transient ischemic attack)             Past Surgical History:     Past Surgical History:         Procedure  Laterality  Date          ?  HX BREAST BIOPSY  Bilateral            neg          ?  HX BREAST REDUCTION         ?  HX CATARACT REMOVAL         ?  HX CHOLECYSTECTOMY    2016     ?  HX COLONOSCOPY    6.2014     ?  HX ENDOSCOPY    6.2014          chronic gastritis          ?  HX ENDOSCOPY    12/2014          gastritis          ?  HX HEART CATHETERIZATION    09/2009          EF 50%, Normal Coronaries          ?  HX HIP REPLACEMENT  Right            ?  HX HYSTERECTOMY               Medication:     Prior to Admission medications             Medication  Sig  Start Date  End Date  Taking?  Authorizing Provider            fluticasone propionate (FLONASE NA)  by Nasal route two (2) times a day.      Yes  Provider, Historical     azelastine-fluticasone 137-50 mcg/spray spry  by Nasal route two (2) times a day.      Yes  Provider, Historical     gabapentin (NEURONTIN) 100 mg capsule  Take 100 mg by mouth three (3) times daily.      Yes  Provider, Historical     montelukast (SINGULAIR) 10 mg tablet  Take 10 mg by mouth nightly.      Yes  Provider, Historical     tiotropium (SPIRIVA) 18 mcg inhalation capsule  Take 1 Cap by inhalation daily.      Yes  Provider, Historical     sertraline (ZOLOFT) 50 mg tablet  Take  by mouth daily.      Yes  Provider, Historical     spironolactone (ALDACTONE) 25 mg tablet  Take 25 mg by mouth two (2) times a day.      Yes  Provider, Historical     atenolol (TENORMIN) 50 mg tablet  Take  by mouth daily.      Yes  Provider, Historical     albuterol (PROVENTIL HFA, VENTOLIN HFA, PROAIR HFA) 90 mcg/actuation inhaler   Take 2 Puffs by inhalation two (2) times a day.      Yes  Provider, Historical     fluticasone propion-salmeteroL (ADVAIR/WIXELA) 500-50 mcg/dose diskus inhaler  Take 1 Puff by inhalation two (2) times a day. Indications: Currently taking 500-59mcg/dose diskus one puff twice a day      Yes  Provider, Historical     PRAVASTATIN SODIUM (PRAVASTATIN PO)  Take 40 mg by mouth daily.      Yes  Provider, Historical     cholecalciferol (VITAMIN D3) (2,000 UNITS /50 MCG) cap capsule  Take 2,000 Units by mouth daily.  03/13/21      Provider, Historical     Xarelto 15 mg tab tablet  TAKE 1 TABLET BY MOUTH EVERY DAY WITH DINNER  04/10/21      Hart Rochester,  Arlyss Gandy., NP     lactulose (CHRONULAC) 10 gram/15 mL solution  Take  by mouth daily. 2.5 mL daily.        Provider, Historical     indapamide (LOZOL) 2.5 mg tablet  Take  by mouth daily.        Provider, Historical     pantoprazole sodium (PANTOPRAZOLE PO)  Take 40 mg by mouth daily.        Provider, Historical     dilTIAZem ER (CARDIZEM CD) 300 mg capsule  Take 300 mg by mouth daily.        Provider, Historical     CALCIUM PO  Take  by mouth. 1200 DAILY        Provider, Historical     OTHER  Vitamin d3 2,000 units daily.        Provider, Historical     clopidogrel (PLAVIX) 75 mg tablet  Take  by mouth daily.        Provider, Historical            ALLOPURINOL PO  Take 100 mg by mouth two (2) times a day.        Provider, Historical           Allergies:     Allergies        Allergen  Reactions         ?  Nuts [Tree Nut]  Swelling     ?  Peanut  Swelling         ?  Simvastatin  Unable to Obtain           Social History:     Social History          Tobacco Use         ?  Smoking status:  Never     ?  Smokeless tobacco:  Never       Substance Use Topics         ?  Alcohol use:  No           Family History:     Family History         Problem  Relation  Age of Onset          ?  Hypertension  Mother       ?  Stroke  Mother       ?  Coronary Art Dis  Sister       ?  Hypertension  Sister        ?  Diabetes  Sister       ?  Hypertension  Brother       ?  Diabetes  Brother       ?  Coronary Art Dis  Brother       ?  Cancer  Brother                lung          ?  Hypertension  Brother       ?  Diabetes  Brother       ?  Cancer  Brother                prostate          ?  Diabetes  Sister             ROS:   Total of 12 systems reviewed as follows:   POSITIVE= bolded text  Negative = text not  bolded         General:  fever, chills, sweats, generalized weakness,  weight loss/gain, loss of appetite    Eyes:    blurred vision, eye pain, loss of vision, double vision   ENT:    rhinorrhea, pharyngitis    Respiratory:  cough, sputum production, SOB, DOE , wheezing, pleuritic pain    Cardiology:   chest pain, palpitations, orthopnea, PND, edema, syncope    Gastrointestinal:  abdominal pain , N/V, diarrhea, dysphagia, constipation, bleeding    Genitourinary:  frequency, urgency, dysuria, hematuria, incontinence, prostatism    Muskuloskeletal: arthralgia, myalgia, back pain   Hematology:   easy bruising, nose or gum bleeding, lymphadenopathy    Dermatological: rash, ulceration, pruritis, color change / jaundice   Endocrine:   hot flashes or polydipsia    Neurological:  headache, dizziness, confusion, focal weakness, paresthesia, speech difficulties, memory loss, gait difficulty   Psychological: feelings of anxiety,  depression, agitation         PHYSICAL EXAM:   Patient Vitals for the past 24 hrs:            Temp  Pulse  Resp  BP  SpO2            07/29/21 0719  97.6 ??F (36.4 ??C)  80  20  120/78  90 %            07/28/21 2117  97.8 ??F (36.6 ??C)  60  20  (!) 140/67  92 %           General:    Alert, cooperative, no distress, appears stated age.      HEENT: Atraumatic, anicteric sclerae, pink conjunctivae      No oral ulcers, mucosa moist, throat clear, dentition fair   Neck:  Supple, symmetrical;   thyroid non tender   Lungs:   Clear to auscultation bilaterally.  No wheezing or rhonchi. No rales.   Chest wall:  No  tenderness.  No accessory muscle use.   Heart:   Regular rhythm.  No  murmur.  Tr edema   Abdomen:   Soft, obese, non-tender. Not distended.  Bowel sounds normal   Extremities: No cyanosis.  No clubbing       Capillary refill normal,  Radial pulse 2+,  DP 1+   Skin:     Not pale.  Not jaundiced.  No rashes    Psych:  Not depressed.  Not anxious or agitated.   Neurologic: EOMs intact. No facial asymmetry. No aphasia or slurred speech.     Symmetrical strength, Sensation grossly intact. Alert and oriented       Lab Data Reviewed:                 07/27/21 12:15  07/27/21 21:30  07/28/21 08:00  07/28/21 17:27  07/28/21 21:30  07/29/21 08:36  07/29/21 11:52  07/29/21 17:11               GLUCOSE,FAST - POC  151 (H)  137 (H)  106  157 (H)  144 (H)  100  161 (H)  125 (H)     (H): Data is abnormally high           07/26/21 05:59        WBC  7.5     NRBC  0.0     RBC  3.20 (L)     HGB  9.7 (L)     HCT  30.6 (L)  MCV  95.6     MCH  30.3     MCHC  31.7     RDW  14.4     PLATELET  170        MPV  9.2     (L): Data is abnormally low           07/26/21 05:59        Sodium  138     Potassium  4.4     Chloride  103     CO2  26     Anion gap  9     Glucose  124 (H)     BUN  16     Creatinine  1.17 (H)     BUN/Creatinine ratio  14     Calcium  8.5     GFR est non-AA  45 (L)        GFR est AA  54 (L)     (H): Data is abnormally high   (L): Data is abnormally low      EKG: 8/7:  NSR 88 bpm, Increased HR since 09 June 2019      Radiology:       pCXR 8/7:   The lungs are clear. Heart size and mediastinal contours are normal.   No pleural effusion or pneumothorax. Bones are age-appropriate.    IMPRESSION: No acute cardiopulmonary abnormality.      L FEMUR 8/7:   L HIP / PELVIS 8/7:   There is a right hip arthroplasty. There is deformity of the left   femoral head with a foreshortened position of the femoral head compared to the   prior radiograph from February 2021. Lumbar spine hardware is partially imaged.   The pelvis is otherwise intact.    2  views of the left femur demonstrate deformity of the femoral head. No femoral   shaft fracture. There are degenerative changes of the distal femur at the knee   joint. Vascular calcifications are noted throughout the left lower leg.    IMPRESSION   Deformity and flattening of the left femoral head against the   acetabulum. No femoral neck or femoral shaft fracture.   Right hip arthroplasty.      Care Plan discussed with:       Patient  x         Family        RN  x      Case Manager           Consultant          Expected  Disposition:       Home with Family  x        HH/PT/OT/RN       SNF/LTC          [facility]          TOTAL TIME:  60 Minutes              Comments           x  Reviewed previous records         >50% of visit spent in counseling and coordination of care  x  Discussion with patient and/or family and questions answered           _______________________________________________________   Given the patient's current clinical presentation, I have a high level of concern for decompensation if discharged from the emergency department.  Complex decision making was performed, which includes reviewing the patient's  available past medical records,  laboratory results, and x-ray films.          My assessment of this patient's clinical condition and my plan of care is as follows.      ASSESSMENT / PLAN      Principal Problem:     Encounter for rehabilitation (07/28/2021)   Active Problems:     Hip osteoarthritis (07/25/2021)     Fall from ground level (07/28/2021)   Patient presented with disabling pain in her hips that was aggravated by recent fall.  Patient is followed at [facility] by orthopedics.  Surgery has apparently been delayed due to her iron deficiency and  anemia as well as poor success on weight reduction.  Patient was evaluated by physical and Occupational Therapy.  Therapy was hampered by the severe pain with limited activity levels.  Patient is not able to be managed at home at this time.  Received  iron  supplementation orally as well as intravenously with Venofer.  Patient is transferred to skilled rehab for continued efforts at pain management and improved functional status.        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  Oral iron supplementation maintained.  Patient received IV infusion iron.  Gastroesophageal reflux was managed with PPI.        COPD (chronic obstructive pulmonary disease) (HCC) ()     COVID-19 (07/24/2021)   Recently diagnosed and treated for COVID-19.  SARS-COV-2 PCR Detected on 8/7.  Droplet Plus isolation till 8/15.   Further functional decline noted during this acute illness.   TCU admitted for physical and occupational therapy to improve her functional status.        T2DM (type 2 diabetes mellitus) (HCC) (03/13/2013)   Hemoglobin A1c fairly well controlled at 7.3.  Monitor blood glucose before meals and at bedtime.  Diabetic education.  Insulin sliding scale.        Dyslipidemia (08/18/2014)   Maintain home treatment with Pravachol 40 mg daily        CKD (chronic kidney disease) ()   Stage IIIa-stable.  Avoid nephrotoxic medication.  Renally adjust medication doses.        HCVD (hypertensive cardiovascular disease) ()     PAF (paroxysmal atrial fibrillation) (HCC) (12/08/2020)   CHADs2-VASC 7.  Rate controlled.  Continue treatment with Xarelto, Atenolol, Cardizem, Aldactone   Symptoms stable.            SAFETY:    Code Status:Full   DVT prophylaxis:Xarelto   Stress Ulcer prophylaxis: Protonixpo  Bladder catheter :no  Family Contact Info:  Primary Emergency Contact: Wellnitz,Gail, Home Phone: [phone]   Bedded: [facility] Room 116/01   Disposition: TBD, likely home when stable   Admission status:  TCU      -Tentative plan of care discussed with patient / family, who demonstrated understanding and is in agreement to the above      Lona Kettle,  MD   [facility] Hospitalist   [phone]

## 2021-07-29 NOTE — Progress Notes (Signed)
Problem: Airway Clearance - Ineffective  Goal: Able to cough effectively  Outcome: Progressing Towards Goal  Goal: Absence of airway secretions  Outcome: Progressing Towards Goal  Goal: Lung sounds clear or within normal limits for patient  Outcome: Progressing Towards Goal     Problem: Bronchodilation Therapy  Goal: Able to breathe comfortably (Bronchodilation Therapy)  Outcome: Progressing Towards Goal

## 2021-07-29 NOTE — Progress Notes (Signed)
 Problem: Mobility Impaired (Adult and Pediatric)  Goal: *Acute Goals and Plan of Care (Insert Text)  Description: FUNCTIONAL STATUS PRIOR TO ADMISSION: Patient was moderately independent with a rollator and used a w/c for community mobility    HOME SUPPORT PRIOR TO ADMISSION: The patient lives alone and had a paid caregiver 6 hours/day    Physical Therapy Goals  Initiated 07/29/2021  1.  Patient will move from supine to sit and sit to supine  in bed with minimal assistance/contact guard assist within 7 day(s).    2.  Patient will transfer from bed to chair and chair to bed with moderate assistance  using the least restrictive device within 7 day(s).  3.  Patient will perform sit to stand with moderate assistance  within 7 day(s).  Outcome: Progressing Towards Goal   PHYSICAL THERAPY EVALUATION  Patient: Michele Mejia (80 y.o. female)  Date: 07/29/2021  Primary Diagnosis: Encounter for rehabilitation [Z51.89]       Precautions: fall, COVID       ASSESSMENT  Based on the objective data described below, the patient presents with requiring max A for bed mobility and supine to sit transfer.  Sp02 93% during treatment.  Patient reports hip pain 10/10 and is also reports skin being very sensitive to touch which also affects her ability to increase her mobility.  Worked on repositioning patient to increase her comfort level. Nurse and aide were made aware of patients c/o pain.      Current Level of Function Impacting Discharge (mobility/balance): max A with mobility    Functional Outcome Measure:  The patient scored 1+/5 on the [employer] sitting  outcome measure which is indicative of patient supporting herself with her UEs when sitting, but requiring the therapists assistance.      Other factors to consider for discharge: lives alone     Patient will benefit from skilled therapy intervention to address the above noted impairments.       PLAN :  Recommendations and Planned Interventions: bed mobility training,  transfer training, gait training, therapeutic exercises, patient and family training/education, and therapeutic activities      Frequency/Duration: Patient will be followed by physical therapy:  4-6 days/week, 1-2 times/dayto address goals.    Recommendation for discharge: (in order for the patient to meet his/her long term goals)  Therapy up to 5 days/week in SNF setting    This discharge recommendation:  A follow-up discussion with the attending provider and/or case management is planned    IF patient discharges home will need the following DME: to be determined         SUBJECTIVE:   Patient stated "I do not think I can get better until I have this hip surgery.  I am in a lot of pain."    OBJECTIVE DATA SUMMARY:   HISTORY:    Past Medical History:   Diagnosis Date    Anemia     Asthma     Change in bowel habits     CKD (chronic kidney disease)     COPD (chronic obstructive pulmonary disease) (HCC)     Depression     DJD (degenerative joint disease)     GERD (gastroesophageal reflux disease)     Glaucoma     Gout     HCVD (hypertensive cardiovascular disease)     Obesity     OSA (obstructive sleep apnea)     PAF (paroxysmal atrial fibrillation) (HCC) 12/08/2020    Rectal bleed  Ringing in ear     TIA (transient ischemic attack)      Past Surgical History:   Procedure Laterality Date    HX BREAST BIOPSY Bilateral     neg    HX BREAST REDUCTION      HX CATARACT REMOVAL      HX CHOLECYSTECTOMY  2016    HX COLONOSCOPY  6.2014    HX ENDOSCOPY  6.2014    chronic gastritis    HX ENDOSCOPY  12/2014    gastritis    HX HEART CATHETERIZATION  09/2009    EF 50%, Normal Coronaries    HX HIP REPLACEMENT Right     HX HYSTERECTOMY         Personal factors and/or comorbidities impacting plan of care:     Home Situation  Home Environment: Private residence  # Steps to Enter: 0  One/Two Story Residence: One story  Living Alone: Yes  Support Systems: Child(ren), Other Family Member(s)  Patient Expects to be Discharged to:: Home with  home health  Current DME Used/Available at Home: [employer], rollator    EXAMINATION/PRESENTATION/DECISION MAKING:   Critical Behavior:              Hearing:  Auditory  Auditory Impairment: None          Strength:     LEs grossly 2/5             Functional Mobility:  Bed Mobility:  Rolling: Maximum assistance  Supine to Sit: Maximum assistance  Sit to Supine: Maximum assistance       Balance:   Sitting: Impaired  Sitting - Static: Good (unsupported)  Sitting - Dynamic: Fair (occasional)             Physical Therapy Evaluation Charge Determination   History Examination Presentation Decision-Making   MEDIUM  Complexity : 1-2 comorbidities / personal factors will impact the outcome/ POC  MEDIUM Complexity : 3 Standardized tests and measures addressing body structure, function, activity limitation and / or participation in recreation  MEDIUM Complexity : Evolving with changing characteristics  MEDIUM Complexity : FOTO score of 26-74      Based on the above components, the patient evaluation is determined to be of the following complexity level: MEDIUM    Pain Rating:  10/10    Activity Tolerance:   Fair    After treatment patient left in no apparent distress:   Call bell within reach, Side rails x 3, and 1/2 sidelying with pillow supporting back and between knees    COMMUNICATION/EDUCATION:   The patient's plan of care was discussed with: Registered nurse and Certified nursing assistant/patient care technician.     Patient/family agree to work toward stated goals and plan of care.    Thank you for this referral.  Connee Evener, PT   Time Calculation: 45 mins

## 2021-07-29 NOTE — Progress Notes (Signed)
Bedside shift change report given to K. Hudnall RN (Cabin crew) by Shela Commons. Pugh RN Physiological scientist). Report included the following information Kardex.

## 2021-07-29 NOTE — Progress Notes (Signed)
 Problem: Airway Clearance - Ineffective  Goal: Achieve or maintain patent airway  Outcome: Progressing Towards Goal     Problem: Gas Exchange - Impaired  Goal: Absence of hypoxia  Outcome: Progressing Towards Goal  Goal: Promote optimal lung function  Outcome: Progressing Towards Goal     Problem: Breathing Pattern - Ineffective  Goal: Ability to achieve and maintain a regular respiratory rate  Outcome: Progressing Towards Goal     Problem: Body Temperature -  Risk of, Imbalanced  Goal: Ability to maintain a body temperature within defined limits  Outcome: Progressing Towards Goal  Goal: Will regain or maintain usual level of consciousness  Outcome: Progressing Towards Goal  Goal: Complications related to the disease process, condition or treatment will be avoided or minimized  Outcome: Progressing Towards Goal     Problem: Isolation Precautions - Risk of Spread of Infection  Goal: Prevent transmission of infectious organism to others  Outcome: Progressing Towards Goal     Problem: Nutrition Deficits  Goal: Optimize nutrtional status  Outcome: Progressing Towards Goal     Problem: Risk for Fluid Volume Deficit  Goal: Maintain normal heart rhythm  Outcome: Progressing Towards Goal  Goal: Maintain absence of muscle cramping  Outcome: Progressing Towards Goal  Goal: Maintain normal serum potassium, sodium, calcium, phosphorus, and pH  Outcome: Progressing Towards Goal

## 2021-07-29 NOTE — Progress Notes (Signed)
Problem: Airway Clearance - Ineffective  Goal: Achieve or maintain patent airway  Outcome: Progressing Towards Goal     Problem: Gas Exchange - Impaired  Goal: Absence of hypoxia  Outcome: Progressing Towards Goal  Goal: Promote optimal lung function  Outcome: Progressing Towards Goal     Problem: Isolation Precautions - Risk of Spread of Infection  Goal: Prevent transmission of infectious organism to others  Outcome: Progressing Towards Goal     Problem: Falls - Risk of  Goal: *Absence of Falls  Description: Document Schmid Fall Risk and appropriate interventions in the flowsheet.  Outcome: Progressing Towards Goal  Note: Fall Risk Interventions:  Mobility Interventions: Bed/chair exit alarm         Medication Interventions: Bed/chair exit alarm    Elimination Interventions: Call light in reach    History of Falls Interventions: Bed/chair exit alarm         Problem: Patient Education: Go to Patient Education Activity  Goal: Patient/Family Education  Outcome: Progressing Towards Goal

## 2021-07-29 NOTE — Progress Notes (Signed)
Bedside and Verbal shift change report given to J. Pugh RN (oncoming nurse) by K. Hudnall RN (offgoing nurse). Report included the following information SBAR, Kardex, Intake/Output, MAR, and Recent Results.

## 2021-07-30 LAB — POCT GLUCOSE
POC Glucose: 132 mg/dL — ABNORMAL HIGH (ref 65–117)
POC Glucose: 97 mg/dL (ref 65–117)

## 2021-07-30 LAB — GLUCOSE, POC
Glucose (POC): 132 mg/dL — ABNORMAL HIGH (ref 65–117)
Glucose (POC): 97 mg/dL (ref 65–117)

## 2021-07-30 MED FILL — FERROUS SULFATE 324 MG (65 MG IRON) TAB, DELAYED RELEASE: 324 mg (65 mg iron) | ORAL | Qty: 1

## 2021-07-30 MED FILL — ALLOPURINOL 100 MG TAB: 100 mg | ORAL | Qty: 1

## 2021-07-30 MED FILL — MONTELUKAST 10 MG TAB: 10 mg | ORAL | Qty: 1

## 2021-07-30 MED FILL — MAGNESIUM OXIDE 400 MG TAB: 400 mg | ORAL | Qty: 1

## 2021-07-30 MED FILL — SPIRONOLACTONE 25 MG TAB: 25 mg | ORAL | Qty: 1

## 2021-07-30 MED FILL — ATENOLOL 25 MG TAB: 25 mg | ORAL | Qty: 2

## 2021-07-30 MED FILL — XARELTO 15 MG TABLET: 15 mg | ORAL | Qty: 1

## 2021-07-30 MED FILL — HYDROCODONE-ACETAMINOPHEN 5 MG-325 MG TAB: 5-325 mg | ORAL | Qty: 1

## 2021-07-30 MED FILL — SERTRALINE 50 MG TAB: 50 mg | ORAL | Qty: 1

## 2021-07-30 MED FILL — GABAPENTIN 100 MG CAP: 100 mg | ORAL | Qty: 1

## 2021-07-30 MED FILL — LIDOCAINE PAIN RELIEF 4 % TOPICAL PATCH: 4 % | CUTANEOUS | Qty: 2

## 2021-07-30 MED FILL — DILTIAZEM ER 120 MG 24 HR CAP: 120 mg | ORAL | Qty: 1

## 2021-07-30 MED FILL — PRAVASTATIN 20 MG TAB: 20 mg | ORAL | Qty: 2

## 2021-07-30 MED FILL — CLOPIDOGREL 75 MG TAB: 75 mg | ORAL | Qty: 1

## 2021-07-30 MED FILL — PANTOPRAZOLE 40 MG TAB, DELAYED RELEASE: 40 mg | ORAL | Qty: 1

## 2021-07-30 NOTE — Progress Notes (Signed)
Problem: Airway Clearance - Ineffective  Goal: Achieve or maintain patent airway  Outcome: Progressing Towards Goal     Problem: Gas Exchange - Impaired  Goal: Absence of hypoxia  Outcome: Progressing Towards Goal  Goal: Promote optimal lung function  Outcome: Progressing Towards Goal     Problem: Breathing Pattern - Ineffective  Goal: Ability to achieve and maintain a regular respiratory rate  Outcome: Progressing Towards Goal     Problem: Isolation Precautions - Risk of Spread of Infection  Goal: Prevent transmission of infectious organism to others  Outcome: Progressing Towards Goal     Problem: Nutrition Deficits  Goal: Optimize nutrtional status  Outcome: Progressing Towards Goal

## 2021-07-30 NOTE — Progress Notes (Signed)
Problem: Airway Clearance - Ineffective  Goal: Achieve or maintain patent airway  Outcome: Progressing Towards Goal     Problem: Gas Exchange - Impaired  Goal: Absence of hypoxia  Outcome: Progressing Towards Goal  Goal: Promote optimal lung function  Outcome: Progressing Towards Goal     Problem: Body Temperature -  Risk of, Imbalanced  Goal: Ability to maintain a body temperature within defined limits  Outcome: Progressing Towards Goal  Goal: Will regain or maintain usual level of consciousness  Outcome: Progressing Towards Goal  Goal: Complications related to the disease process, condition or treatment will be avoided or minimized  Outcome: Progressing Towards Goal     Problem: Isolation Precautions - Risk of Spread of Infection  Goal: Prevent transmission of infectious organism to others  Outcome: Progressing Towards Goal     Problem: Nutrition Deficits  Goal: Optimize nutrtional status  Outcome: Progressing Towards Goal

## 2021-07-30 NOTE — Progress Notes (Signed)
Problem: Airway Clearance - Ineffective  Goal: Achieve or maintain patent airway  Outcome: Progressing Towards Goal     Problem: Gas Exchange - Impaired  Goal: Absence of hypoxia  Outcome: Progressing Towards Goal  Goal: Promote optimal lung function  Outcome: Progressing Towards Goal     Problem: Breathing Pattern - Ineffective  Goal: Ability to achieve and maintain a regular respiratory rate  Outcome: Progressing Towards Goal     Problem: Body Temperature -  Risk of, Imbalanced  Goal: Ability to maintain a body temperature within defined limits  Outcome: Progressing Towards Goal  Goal: Will regain or maintain usual level of consciousness  Outcome: Progressing Towards Goal  Goal: Complications related to the disease process, condition or treatment will be avoided or minimized  Outcome: Progressing Towards Goal     Problem: Isolation Precautions - Risk of Spread of Infection  Goal: Prevent transmission of infectious organism to others  Outcome: Progressing Towards Goal     Problem: Nutrition Deficits  Goal: Optimize nutrtional status  Outcome: Progressing Towards Goal

## 2021-07-30 NOTE — Progress Notes (Signed)
Bedside shift change report given to K. Hudnall RN (oncoming nurse) by J. Pugh RN (offgoing nurse). Report included the following information Kardex.

## 2021-07-30 NOTE — Progress Notes (Signed)
Problem: Airway Clearance - Ineffective  Goal: Able to cough effectively  07/30/2021 0655 by Caren Hazy, RT  Outcome: Progressing Towards Goal  07/29/2021 2058 by Caren Hazy, RT  Outcome: Progressing Towards Goal  Goal: Absence of airway secretions  07/30/2021 0655 by Caren Hazy, RT  Outcome: Progressing Towards Goal  07/29/2021 2058 by Caren Hazy, RT  Outcome: Progressing Towards Goal  Goal: Lung sounds clear or within normal limits for patient  07/30/2021 0655 by Caren Hazy, RT  Outcome: Progressing Towards Goal  07/29/2021 2058 by Caren Hazy, RT  Outcome: Progressing Towards Goal     Problem: Bronchodilation Therapy  Goal: Able to breathe comfortably (Bronchodilation Therapy)  07/30/2021 0655 by Caren Hazy, RT  Outcome: Progressing Towards Goal  07/29/2021 2058 by Caren Hazy, RT  Outcome: Progressing Towards Goal

## 2021-07-30 NOTE — Progress Notes (Signed)
Problem: Airway Clearance - Ineffective  Goal: Able to cough effectively  Outcome: Progressing Towards Goal  Goal: Absence of airway secretions  Outcome: Progressing Towards Goal  Goal: Lung sounds clear or within normal limits for patient  Outcome: Progressing Towards Goal     Problem: Bronchodilation Therapy  Goal: Able to breathe comfortably (Bronchodilation Therapy)  Outcome: Progressing Towards Goal

## 2021-07-31 LAB — CBC WITH AUTO DIFFERENTIAL
Basophils %: 0 % (ref 0–1)
Basophils Absolute: 0 10*3/uL (ref 0.0–0.1)
Eosinophils %: 5 % (ref 0–7)
Eosinophils Absolute: 0.4 10*3/uL (ref 0.0–0.4)
Granulocyte Absolute Count: 0 10*3/uL (ref 0.00–0.04)
Hematocrit: 31.6 % — ABNORMAL LOW (ref 35.0–47.0)
Hemoglobin: 10.1 g/dL — ABNORMAL LOW (ref 11.5–16.0)
Immature Granulocytes: 0 % (ref 0.0–0.5)
Lymphocytes %: 18 % (ref 12–49)
Lymphocytes Absolute: 1.5 10*3/uL (ref 0.8–3.5)
MCH: 30.3 PG (ref 26.0–34.0)
MCHC: 32 g/dL (ref 30.0–36.5)
MCV: 94.9 FL (ref 80.0–99.0)
MPV: 9.4 FL (ref 8.9–12.9)
Monocytes %: 6 % (ref 5–13)
Monocytes Absolute: 0.5 10*3/uL (ref 0.0–1.0)
NRBC Absolute: 0 10*3/uL (ref 0.00–0.01)
Neutrophils %: 71 % (ref 32–75)
Neutrophils Absolute: 5.6 10*3/uL (ref 1.8–8.0)
Nucleated RBCs: 0 PER 100 WBC
Platelets: 244 10*3/uL (ref 150–400)
RBC: 3.33 M/uL — ABNORMAL LOW (ref 3.80–5.20)
RDW: 14 % (ref 11.5–14.5)
WBC: 8.1 10*3/uL (ref 3.6–11.0)

## 2021-07-31 LAB — BASIC METABOLIC PANEL
Anion Gap: 7 mmol/L (ref 5–15)
BUN: 18 MG/DL (ref 6–20)
Bun/Cre Ratio: 15 (ref 12–20)
CO2: 30 mmol/L (ref 21–32)
Calcium: 8.9 MG/DL (ref 8.5–10.1)
Chloride: 100 mmol/L (ref 97–108)
Creatinine: 1.21 MG/DL — ABNORMAL HIGH (ref 0.55–1.02)
EGFR IF NonAfrican American: 43 mL/min/{1.73_m2} — ABNORMAL LOW (ref >60)
GFR African American: 52 mL/min/{1.73_m2} — ABNORMAL LOW (ref >60)
Glucose: 112 mg/dL — ABNORMAL HIGH (ref 65–100)
Potassium: 4.9 mmol/L (ref 3.5–5.1)
Sodium: 137 mmol/L (ref 136–145)

## 2021-07-31 LAB — MAGNESIUM
Magnesium: 2 mg/dL (ref 1.6–2.4)
Magnesium: 2 mg/dL (ref 1.6–2.4)

## 2021-07-31 LAB — CBC WITH AUTOMATED DIFF
ABS. BASOPHILS: 0 10*3/uL (ref 0.0–0.1)
ABS. EOSINOPHILS: 0.4 10*3/uL (ref 0.0–0.4)
ABS. IMM. GRANS.: 0 10*3/uL (ref 0.00–0.04)
ABS. LYMPHOCYTES: 1.5 10*3/uL (ref 0.8–3.5)
ABS. MONOCYTES: 0.5 10*3/uL (ref 0.0–1.0)
ABS. NEUTROPHILS: 5.6 10*3/uL (ref 1.8–8.0)
ABSOLUTE NRBC: 0 10*3/uL (ref 0.00–0.01)
BASOPHILS: 0 % (ref 0–1)
EOSINOPHILS: 5 % (ref 0–7)
HCT: 31.6 % — ABNORMAL LOW (ref 35.0–47.0)
HGB: 10.1 g/dL — ABNORMAL LOW (ref 11.5–16.0)
IMMATURE GRANULOCYTES: 0 % (ref 0.0–0.5)
LYMPHOCYTES: 18 % (ref 12–49)
MCH: 30.3 PG (ref 26.0–34.0)
MCHC: 32 g/dL (ref 30.0–36.5)
MCV: 94.9 FL (ref 80.0–99.0)
MONOCYTES: 6 % (ref 5–13)
MPV: 9.4 FL (ref 8.9–12.9)
NEUTROPHILS: 71 % (ref 32–75)
NRBC: 0 PER 100 WBC
PLATELET: 244 10*3/uL (ref 150–400)
RBC: 3.33 M/uL — ABNORMAL LOW (ref 3.80–5.20)
RDW: 14 % (ref 11.5–14.5)
WBC: 8.1 10*3/uL (ref 3.6–11.0)

## 2021-07-31 LAB — METABOLIC PANEL, BASIC
Anion gap: 7 mmol/L (ref 5–15)
BUN/Creatinine ratio: 15 (ref 12–20)
BUN: 18 MG/DL (ref 6–20)
CO2: 30 mmol/L (ref 21–32)
Calcium: 8.9 MG/DL (ref 8.5–10.1)
Chloride: 100 mmol/L (ref 97–108)
Creatinine: 1.21 MG/DL — ABNORMAL HIGH (ref 0.55–1.02)
GFR est AA: 52 mL/min/{1.73_m2} — ABNORMAL LOW (ref >60)
GFR est non-AA: 43 mL/min/{1.73_m2} — ABNORMAL LOW (ref >60)
Glucose: 112 mg/dL — ABNORMAL HIGH (ref 65–100)
Potassium: 4.9 mmol/L (ref 3.5–5.1)
Sodium: 137 mmol/L (ref 136–145)

## 2021-07-31 MED FILL — PRAVASTATIN 20 MG TAB: 20 mg | ORAL | Qty: 2

## 2021-07-31 MED FILL — ALLOPURINOL 100 MG TAB: 100 mg | ORAL | Qty: 1

## 2021-07-31 MED FILL — XARELTO 15 MG TABLET: 15 mg | ORAL | Qty: 1

## 2021-07-31 MED FILL — MAGNESIUM OXIDE 400 MG TAB: 400 mg | ORAL | Qty: 1

## 2021-07-31 MED FILL — GABAPENTIN 100 MG CAP: 100 mg | ORAL | Qty: 1

## 2021-07-31 MED FILL — ATENOLOL 25 MG TAB: 25 mg | ORAL | Qty: 2

## 2021-07-31 MED FILL — HYDROCODONE-ACETAMINOPHEN 5 MG-325 MG TAB: 5-325 mg | ORAL | Qty: 1

## 2021-07-31 MED FILL — FERROUS SULFATE 324 MG (65 MG IRON) TAB, DELAYED RELEASE: 324 mg (65 mg iron) | ORAL | Qty: 1

## 2021-07-31 MED FILL — CLOPIDOGREL 75 MG TAB: 75 mg | ORAL | Qty: 1

## 2021-07-31 MED FILL — SPIRONOLACTONE 25 MG TAB: 25 mg | ORAL | Qty: 1

## 2021-07-31 MED FILL — DILTIAZEM ER 120 MG 24 HR CAP: 120 mg | ORAL | Qty: 1

## 2021-07-31 MED FILL — MONTELUKAST 10 MG TAB: 10 mg | ORAL | Qty: 1

## 2021-07-31 MED FILL — PANTOPRAZOLE 40 MG TAB, DELAYED RELEASE: 40 mg | ORAL | Qty: 1

## 2021-07-31 MED FILL — LIDOCAINE PAIN RELIEF 4 % TOPICAL PATCH: 4 % | CUTANEOUS | Qty: 2

## 2021-07-31 MED FILL — SERTRALINE 50 MG TAB: 50 mg | ORAL | Qty: 1

## 2021-07-31 NOTE — Progress Notes (Signed)
 Problem: Mobility Impaired (Adult and Pediatric)  Goal: *Acute Goals and Plan of Care (Insert Text)  Description: FUNCTIONAL STATUS PRIOR TO ADMISSION: Patient was moderately independent with a rollator and used a w/c for community mobility    HOME SUPPORT PRIOR TO ADMISSION: The patient lives alone and had a paid caregiver 6 hours/day    Physical Therapy Goals  Initiated 07/29/2021  1.  Patient will move from supine to sit and sit to supine  in bed with minimal assistance/contact guard assist within 7 day(s).    2.  Patient will transfer from bed to chair and chair to bed with moderate assistance  using the least restrictive device within 7 day(s).  3.  Patient will perform sit to stand with moderate assistance  within 7 day(s).  Outcome: Progressing Towards Goal     Problem: Patient Education: Go to Patient Education Activity  Goal: Patient/Family Education  Outcome: Progressing Towards Goal    PHYSICAL THERAPY TREATMENT  Patient: Michele Mejia (80 y.o. female)  Date: 07/31/2021  Diagnosis: Encounter for rehabilitation [Z51.89] Encounter for rehabilitation      Precautions: Contact, Fall, Other (comment) (covid)  Chart, physical therapy assessment, plan of care and goals were reviewed.    ASSESSMENT  Patient continues with skilled PT services and very slowly is progressing towards goals. Pt presented resting in bed in isolation room. She c/o stiffness and soreness in B LEs and her skin was very sensitive even to light touch. Pt is very slow moving and requires a lot of additional time for functional mobility activities. Overall her bed mobility was max A but she was able to sit EOB x20 minutes w/ fair to fair (+) balance; no LOB. 3 attempts made to stand. First attempt w/ SW and pt was almost able to achieve full upright standing but she began to get dizzy and had to sit back down. For the other 2 attempts Zenda was used to help her but again she was unable to achieve enough hip extension to be able to  get the Best[employer] paddles under her buttocks and she was unable to tolerate therapist trying to help due to sacral and skin sensitivity to the touch. Ultimately she participated in scooting activities on EOB and was returned to supine then propped on her L side w/ pillows. At end of session pt was left w/ all needs met as able and she was setup for lunch.      Current Level of Function Impacting Discharge (mobility/balance): max A for functional mobility efforts; unable to stand and/or walk; sitting balance fair (+) but standing balance and tolerance is poor at best    Other factors to consider for discharge: lives alone w/ CG assistance; ambulatory w/ rollator at baseline         PLAN :  Patient continues to benefit from skilled intervention to address the above impairments.  Continue treatment per established plan of care.  to address goals.    Recommendation for discharge: (in order for the patient to meet his/her long term goals)  To be determined: pending progress= home w/ HH and caregivers vs LTC    This discharge recommendation:  Has not yet been discussed the attending provider and/or case management    IF patient discharges home will need the following DME: to be determined (TBD)       SUBJECTIVE:   Patient stated "I'm stiff."    OBJECTIVE DATA SUMMARY:   Critical Behavior:  Neurologic State: Alert  Cognition: Follows Neurosurgeon:  Bed Mobility:  Rolling: Maximum assistance  Supine to Sit: Maximum assistance;Additional time  Sit to Supine: Moderate assistance;Maximum assistance  Scooting: Moderate assistance;Maximum assistance;Additional time        Transfers:  Sit to Stand: Maximum assistance;Additional time;Assist x1  Stand to Sit: Moderate assistance;Maximum assistance;Assist x1                             Balance:  Sitting - Static: Good (unsupported)  Sitting - Dynamic: Fair (occasional)  Standing - Static: Poor  Standing - Dynamic : Not tested (unable)  Ambulation/Gait  Training:               unable                                          Therapeutic Exercises:   Gentle supine therex for B LEs as tolerated including ankle pumps and heel slides; each 1-2 sets and reps up to 12 as tolerated    Pain Rating:  9/10 L hip    Activity Tolerance:   Poor    After treatment patient left in no apparent distress:   Supine in bed, Patient positioned in L sidelying for pressure relief, Call bell within reach, and Bed / chair alarm activated    COMMUNICATION/COLLABORATION:   The patient's plan of care was discussed with: Occupational therapist, Registered nurse, and Certified nursing assistant/patient care technician.     Dorn Hand, PT   Time Calculation: 58 mins

## 2021-07-31 NOTE — Progress Notes (Signed)
Problem: Airway Clearance - Ineffective  Goal: Achieve or maintain patent airway  Outcome: Progressing Towards Goal     Problem: Gas Exchange - Impaired  Goal: Absence of hypoxia  Outcome: Progressing Towards Goal  Goal: Promote optimal lung function  Outcome: Progressing Towards Goal     Problem: Breathing Pattern - Ineffective  Goal: Ability to achieve and maintain a regular respiratory rate  Outcome: Progressing Towards Goal     Problem: Body Temperature -  Risk of, Imbalanced  Goal: Ability to maintain a body temperature within defined limits  Outcome: Progressing Towards Goal     Problem: Isolation Precautions - Risk of Spread of Infection  Goal: Prevent transmission of infectious organism to others  Outcome: Progressing Towards Goal     Problem: Nutrition Deficits  Goal: Optimize nutrtional status  Outcome: Progressing Towards Goal     Problem: Risk for Fluid Volume Deficit  Goal: Maintain absence of muscle cramping  Outcome: Progressing Towards Goal  Goal: Maintain normal serum potassium, sodium, calcium, phosphorus, and pH  Outcome: Progressing Towards Goal     Problem: Loneliness or Risk for Loneliness  Goal: Demonstrate positive use of time alone when socialization is not possible  Outcome: Progressing Towards Goal     Problem: Fatigue  Goal: Verbalize increase energy and improved vitality  Outcome: Progressing Towards Goal     Problem: Diabetes Self-Management  Goal: *Monitoring blood glucose, interpreting and using results  Description: Identify recommended blood glucose targets  and personal targets.  Outcome: Progressing Towards Goal     Problem: Falls - Risk of  Goal: *Absence of Falls  Description: Document Bridgette Habermann Fall Risk and appropriate interventions in the flowsheet.  Outcome: Progressing Towards Goal  Note: Fall Risk Interventions:  Mobility Interventions: PT Consult for mobility concerns, Utilize walker, cane, or other assistive device         Medication Interventions: Patient to call before  getting OOB, Utilize gait belt for transfers/ambulation    Elimination Interventions: Call light in reach    History of Falls Interventions: Bed/chair exit alarm

## 2021-07-31 NOTE — Progress Notes (Signed)
Called [facility] to discuss discharge plans. No answer. Left message.

## 2021-07-31 NOTE — Progress Notes (Signed)
Bedside shift change report given to K Johnson LPN (oncoming nurse) by Tammy Jenkins, RN  (offgoing nurse). Report included the following information Kardex and Recent Results.

## 2021-07-31 NOTE — Progress Notes (Signed)
Problem: Airway Clearance - Ineffective  Goal: Achieve or maintain patent airway  Outcome: Progressing Towards Goal     Problem: Gas Exchange - Impaired  Goal: Absence of hypoxia  Outcome: Progressing Towards Goal  Goal: Promote optimal lung function  Outcome: Progressing Towards Goal     Problem: Breathing Pattern - Ineffective  Goal: Ability to achieve and maintain a regular respiratory rate  Outcome: Progressing Towards Goal

## 2021-07-31 NOTE — Progress Notes (Signed)
Bedside shift change report given to T. Jenkins RN (oncoming nurse) by J. Pugh RN (offgoing nurse). Report included the following information Kardex.

## 2021-07-31 NOTE — Progress Notes (Signed)
Hospitalist Progress note    Pt followed [facility]  Chalmers Guest, MD  [phone]    Hg 10.1,   MCV 95  Fe low  TIBC low   %FeSat all low  Given Venofer 100mg  8/8 and 300mg  8/11  Cont FeSO4 324mg  daily    Will follow up Fe, TIBC, %Fe Sat in AM  Not MCV not low   Discuss with [facility] in AM  Pt is more motivated for PT today

## 2021-07-31 NOTE — Progress Notes (Signed)
IDR Team; MD,  Care Manager, Nursing Dietitian, Chaplain, met to review patient's plan of care. Discussed goals, interventions, barriers and progress.       RUR: 14%    Team will continue to monitor progress and report any concerns to the physician and care management as indicated.    Transition of Care Plan:  Patient continues to work with therapy. According to OT, she did a little better today with participation. Has had 2 infusions of iron. Discussed discharge plans with the patient. Attempted to contact [facility] to find out if the patient's aide has gotten over her illness. Maryruth Hancock, CM, contacted [facility] and spoke with the owner, Cammie Mcgee inquiring about more hours for the patient. Denman George stated that she will check to see if the patient can get respite hours because she is going to need someone with her 24 hours. Patient does not want to go to a facility, but she will think about it. She really has no other choice. When asked if her family could come and stay with her, she stated that they couldn't do that.  Inquired about obtaining another iron level in IDR meeting this morning. Patient stated that in order for [facility] to do her hip replacement, her iron level had to be up and there was also mention in rounds that she has to lose weight as well.

## 2021-07-31 NOTE — Progress Notes (Signed)
Long conversation with both of patient's daughters (and patient included)Michele Mejia and Michele Mejia. Explained to them the plan:  Home with Home Health and Personal Care with more Respite hours  More skilled care in a Skilled care facility(asked patient to think about what skilled care facility she would want to go to.  They all verbalized understanding and agree with the plan. Dr. Constance Goltz will try to call the surgeon at [facility] that is planning to do the patient's total hip replacement.

## 2021-07-31 NOTE — Progress Notes (Signed)
 Problem: Self Care Deficits Care Plan (Adult)  Goal: *Acute Goals and Plan of Care (Insert Text)  Description: FUNCTIONAL STATUS PRIOR TO ADMISSION: Pt currently needs a hip replacement. She has caregivers with her for part of the day 7 days a week.  Pain is her limiting factor and since she's not been getting up she has more issues with strength and endurance to stand.     HOME SUPPORT: Pt has family and paid caregiver.    Occupational Therapy Goals  Initiated 07/31/2021  1.  Patient will perform upper body dressing with supervision/set-up within 7 day(s).  2.  Patient will perform bathing with modified independence within 7 day(s).  3.  Patient will perform lower body dressing with moderate assistance  within 7 day(s).  4.  Patient will perform toilet transfers with moderate assistance  within 7 day(s).  5.  Patient will perform all aspects of toileting with moderate assistance  within 7 day(s).  6.  Patient will participate in upper extremity therapeutic exercise/activities with supervision/set-up for 8 minutes within 7 day(s).    7.  Patient will utilize energy conservation techniques during functional activities with verbal cues within 7 day(s).    Outcome: Progressing Towards Goal   OCCUPATIONAL THERAPY EVALUATION  Patient: Michele Mejia (80 y.o. female)  Date: 07/31/2021  Primary Diagnosis: Encounter for rehabilitation [Z51.89]       Precautions:   Contact, Fall, Other (comment) (covid)    ASSESSMENT  Based on the objective data described below, the patient presents with need for rehab due to debility however pt needs a hip replacement/surgery which was postponed due to low iron. She has Covid and her 10 days will be up on the 17th.  She has caregiver at home 7 days a week but not 24 hours a day.     Current Level of Function Impacting Discharge (ADLs/self-care): Assist of 1 to EOB today going slowly and wanting to move the LLE herself. She is able to scoot forward with mod assist. Later was able to  partial stand and scoot bottom x 3 toward the head of bed. She is able to groom seated EOB and wash from face to knees once basin of water and supplies set-up.  She is still dependent for toileting needs.    Functional Outcome Measure:  The patient scored Total: 25/100 on the Barthel Index outcome measure which is indicative of being significantly impaired in basic self-care.       Other factors to consider for discharge: has home caregiver     Patient will benefit from skilled therapy intervention to address the above noted impairments.       PLAN :  Recommendations and Planned Interventions: self care training, functional mobility training, therapeutic exercise, therapeutic activities, endurance activities, and patient education    Frequency/Duration: Patient will be followed by occupational therapy 3 - 5 times a week 1-2 x day to address goals.    Recommendation for discharge: (in order for the patient to meet his/her long term goals)  Occupational therapy at least 2 days/week in the home AND ensure assist and/or supervision for safety with transfers    This discharge recommendation:  Has been made in collaboration with the attending provider and/or case management    IF patient discharges home will need the following DME: none       SUBJECTIVE:   Patient stated "I can do it." Re: moving her legs to EOB.    OBJECTIVE DATA SUMMARY:   HISTORY:  Past Medical History:   Diagnosis Date    Anemia     Asthma     Change in bowel habits     CKD (chronic kidney disease)     COPD (chronic obstructive pulmonary disease) (HCC)     Depression     DJD (degenerative joint disease)     GERD (gastroesophageal reflux disease)     Glaucoma     Gout     HCVD (hypertensive cardiovascular disease)     Obesity     OSA (obstructive sleep apnea)     PAF (paroxysmal atrial fibrillation) (HCC) 12/08/2020    Rectal bleed     Ringing in ear     TIA (transient ischemic attack)      Past Surgical History:   Procedure Laterality Date    HX  BREAST BIOPSY Bilateral     neg    HX BREAST REDUCTION      HX CATARACT REMOVAL      HX CHOLECYSTECTOMY  2016    HX COLONOSCOPY  6.2014    HX ENDOSCOPY  6.2014    chronic gastritis    HX ENDOSCOPY  12/2014    gastritis    HX HEART CATHETERIZATION  09/2009    EF 50%, Normal Coronaries    HX HIP REPLACEMENT Right     HX HYSTERECTOMY         Expanded or extensive additional review of patient history:     Home Situation  Home Environment: Private residence  # Steps to Enter: 0  One/Two Story Residence: One story  Living Alone: Yes  Support Systems: Child(ren), Other Family Member(s)  Patient Expects to be Discharged to:: Home with home health  Current DME Used/Available at Home: Environmental consultant, rollator    Hand dominance: Right    EXAMINATION OF PERFORMANCE DEFICITS:  Cognitive/Behavioral Status:  Neurologic State: Alert     Cognition: Follows commands     Perseveration: No perseveration noted  Safety/Judgement: Awareness of environment    Skin: pt complains of a sore on her bottom between butt cheeks and nursing aware. OTR applied barrier cream to area at pt request.  Also applied under breasts and under aprom on the L side    Edema: none seen    Hearing:  Auditory  Auditory Impairment: None    Vision/Perceptual:                                Corrective Lenses: Reading glasses    Range of Motion:    AROM: Generally decreased, functional                         Strength:    Strength: Generally decreased, functional                Coordination:  Coordination: Within functional limits  Fine Motor Skills-Upper: Left Intact;Right Intact    Gross Motor Skills-Upper: Left Intact;Right Intact    Tone & Sensation:    Tone: Normal  Sensation: Intact                      Balance:  Sitting: Impaired  Sitting - Static: Good (unsupported)  Sitting - Dynamic: Fair (occasional)  Standing - Static: Not tested  Standing - Dynamic : Not tested    Functional Mobility and Transfers for ADLs:  Bed Mobility:  Rolling: Assist x1  Supine to Sit:  Assist x1;Additional time;Moderate assistance  Sit to Supine: Assist x1;Additional time;Moderate assistance  Scooting: Assist x1;Moderate assistance    Transfers:  Sit to Stand: Maximum assistance;Additional time;Assist x1  Stand to Sit: Moderate assistance;Maximum assistance;Assist x1    ADL Assessment:  Feeding: Independent    Oral Facial Hygiene/Grooming: Setup    Bathing: Moderate assistance    Type of Bath: Basin/Soap/Water;Partial    Upper Body Dressing: Minimum assistance    Lower Body Dressing: Total assistance    Toileting: Total assistance                ADL Intervention and task modifications:       Grooming  Grooming Assistance: Set-up  Position Performed: Seated edge of bed  Washing Face: Independent;Set-up  Washing Hands: Independent;Modified independent  Brushing Teeth: Independent (when implements provided)    Upper Body Bathing  Bathing Assistance: Independent  Position Performed: Seated edge of bed    Type of Bath: Basin/Soap/Water;Partial    Lower Body Bathing  Lower Body : Moderate assistance (can wash to knees)  Position Performed: Seated edge of bed    Upper Body Dressing Assistance  Hospital Gown: Minimum  assistance    Lower Body Dressing Assistance  Socks: Total assistance (dependent)    Toileting  Clothing Management: Total assistance (dependent)    Cognitive Retraining  Safety/Judgement: Awareness of environment    Functional Measure:    Barthel Index:  Bathing: 0  Bladder: 5  Bowels: 5  Grooming: 5  Dressing: 0  Feeding: 10  Mobility: 0  Stairs: 0  Toilet Use: 0  Transfer (Bed to Chair and Back): 0  Total: 25/100      The Barthel ADL Index: Guidelines  1. The index should be used as a record of what a patient does, not as a record of what a patient could do.  2. The main aim is to establish degree of independence from any help, physical or verbal, however minor and for whatever reason.  3. The need for supervision renders the patient not independent.  4. A patient's performance should be  established using the best available evidence. Asking the patient, friends/relatives and nurses are the usual sources, but direct observation and common sense are also important. However direct testing is not needed.  5. Usually the patient's performance over the preceding 24-48 hours is important, but occasionally longer periods will be relevant.  6. Middle categories imply that the patient supplies over 50 per cent of the effort.  7. Use of aids to be independent is allowed.    Score Interpretation (from Sinoff 1997)   80-100 Independent   60-79 Minimally independent   40-59 Partially dependent   20-39 Very dependent   <20 Totally dependent     -Mahoney, F.l., Barthel, D.W. (1965). Functional evaluation: the Barthel Index. [place] [address] Med [place] (14)2.  -Sinoff, G., Ore, L. (1997). The Barthel activities of daily living index: self-reporting versus actual performance in the old (> or = 75 years). Journal of American Geriatric Society 45(7), [phone].   -Fleeta cotton [place], J.J.M.F, Orman ROES., Oneita MERYL Sebastian DELORSE. (1999). Measuring the change in disability after inpatient rehabilitation; comparison of the responsiveness of the Barthel Index and Functional Independence Measure. Journal of Neurology, Neurosurgery, and Psychiatry, 66(4), [phone].  Marea Chilling[place], N.J.A, Scholte op [place],  W.J.M, & [place]op[place]chap, M.A. (2004) Assessment of post-stroke quality of life in cost-effectiveness studies: The usefulness of the Barthel Index and the EuroQoL-5D. Quality of Life Research, 13, [phone]  Occupational Therapy Evaluation Charge Determination   History Examination Decision-Making   LOW Complexity : Brief history review  LOW Complexity : 1-3 performance deficits relating to physical, cognitive , or psychosocial skils that result in activity limitations and / or participation restrictions  LOW Complexity : No comorbidities that affect functional and no verbal or physical assistance needed to complete eval tasks       Based on  the above components, the patient evaluation is determined to be of the following complexity level: LOW   Pain Rating:  0/10      Activity Tolerance:   Fair    After treatment patient left in no apparent distress:    Supine in bed and Call bell within reach    COMMUNICATION/EDUCATION:   The patient's plan of care was discussed with: Case management.     Patient/family have participated as able in goal setting and plan of care.    This patient's plan of care is appropriate for delegation to OTA.    Thank you for this referral.  Ellouise Severe, OT  Time Calculation: 41 mins

## 2021-08-01 LAB — IRON AND TIBC
Iron Saturation: 14 % — ABNORMAL LOW (ref 20–50)
Iron: 27 ug/dL — ABNORMAL LOW (ref 50–170)
TIBC: 193 ug/dL — ABNORMAL LOW (ref 250–450)

## 2021-08-01 LAB — IRON PROFILE
Iron % saturation: 14 % — ABNORMAL LOW (ref 20–50)
Iron: 27 ug/dL — ABNORMAL LOW (ref 50–170)
TIBC: 193 ug/dL — ABNORMAL LOW (ref 250–450)

## 2021-08-01 MED FILL — GABAPENTIN 100 MG CAP: 100 mg | ORAL | Qty: 1

## 2021-08-01 MED FILL — XARELTO 15 MG TABLET: 15 mg | ORAL | Qty: 1

## 2021-08-01 MED FILL — LIDOCAINE PAIN RELIEF 4 % TOPICAL PATCH: 4 % | CUTANEOUS | Qty: 2

## 2021-08-01 MED FILL — ATENOLOL 25 MG TAB: 25 mg | ORAL | Qty: 2

## 2021-08-01 MED FILL — CLOPIDOGREL 75 MG TAB: 75 mg | ORAL | Qty: 1

## 2021-08-01 MED FILL — SERTRALINE 50 MG TAB: 50 mg | ORAL | Qty: 1

## 2021-08-01 MED FILL — MAGNESIUM OXIDE 400 MG TAB: 400 mg | ORAL | Qty: 1

## 2021-08-01 MED FILL — PANTOPRAZOLE 40 MG TAB, DELAYED RELEASE: 40 mg | ORAL | Qty: 1

## 2021-08-01 MED FILL — PRAVASTATIN 20 MG TAB: 20 mg | ORAL | Qty: 2

## 2021-08-01 MED FILL — SPIRONOLACTONE 25 MG TAB: 25 mg | ORAL | Qty: 1

## 2021-08-01 MED FILL — FERROUS SULFATE 324 MG (65 MG IRON) TAB, DELAYED RELEASE: 324 mg (65 mg iron) | ORAL | Qty: 1

## 2021-08-01 MED FILL — MONTELUKAST 10 MG TAB: 10 mg | ORAL | Qty: 1

## 2021-08-01 MED FILL — ALLOPURINOL 100 MG TAB: 100 mg | ORAL | Qty: 1

## 2021-08-01 MED FILL — HYDROCODONE-ACETAMINOPHEN 5 MG-325 MG TAB: 5-325 mg | ORAL | Qty: 1

## 2021-08-01 MED FILL — DILTIAZEM ER 120 MG 24 HR CAP: 120 mg | ORAL | Qty: 1

## 2021-08-01 NOTE — Progress Notes (Signed)
Problem: Airway Clearance - Ineffective  Goal: Achieve or maintain patent airway  Outcome: Progressing Towards Goal     Problem: Gas Exchange - Impaired  Goal: Absence of hypoxia  Outcome: Progressing Towards Goal  Goal: Promote optimal lung function  Outcome: Progressing Towards Goal     Problem: Breathing Pattern - Ineffective  Goal: Ability to achieve and maintain a regular respiratory rate  Outcome: Progressing Towards Goal     Problem: Body Temperature -  Risk of, Imbalanced  Goal: Ability to maintain a body temperature within defined limits  Outcome: Progressing Towards Goal     Problem: Isolation Precautions - Risk of Spread of Infection  Goal: Prevent transmission of infectious organism to others  Outcome: Progressing Towards Goal     Problem: Nutrition Deficits  Goal: Optimize nutrtional status  Outcome: Progressing Towards Goal     Problem: Risk for Fluid Volume Deficit  Goal: Maintain absence of muscle cramping  Outcome: Progressing Towards Goal  Goal: Maintain normal serum potassium, sodium, calcium, phosphorus, and pH  Outcome: Progressing Towards Goal     Problem: Loneliness or Risk for Loneliness  Goal: Demonstrate positive use of time alone when socialization is not possible  Outcome: Progressing Towards Goal     Problem: Fatigue  Goal: Verbalize increase energy and improved vitality  Outcome: Progressing Towards Goal     Problem: Patient Education: Go to Patient Education Activity  Goal: Patient/Family Education  Outcome: Progressing Towards Goal     Problem: Falls - Risk of  Goal: *Absence of Falls  Description: Document Schmid Fall Risk and appropriate interventions in the flowsheet.  Outcome: Progressing Towards Goal  Note: Fall Risk Interventions:  Mobility Interventions: Bed/chair exit alarm, Patient to call before getting OOB, Utilize walker, cane, or other assistive device         Medication Interventions: Bed/chair exit alarm, Patient to call before getting OOB, Teach patient to arise  slowly    Elimination Interventions: Call light in reach, Bed/chair exit alarm, Patient to call for help with toileting needs    History of Falls Interventions: Bed/chair exit alarm, Room close to nurse's station         Problem: Patient Education: Go to Patient Education Activity  Goal: Patient/Family Education  Outcome: Progressing Towards Goal

## 2021-08-01 NOTE — Progress Notes (Signed)
 Bedside and Verbal shift change report given to S. Britt Bottom, Charity fundraiser (Cabin crew) by [place] Johnson, LPN (offgoing nurse). Report included the following information SBAR, Kardex, Intake/Output, and MAR.

## 2021-08-01 NOTE — Progress Notes (Signed)
Problem: Mobility Impaired (Adult and Pediatric)  Goal: *Acute Goals and Plan of Care (Insert Text)  Description: FUNCTIONAL STATUS PRIOR TO ADMISSION: Patient was moderately independent with a rollator and used a w/c for community mobility    HOME SUPPORT PRIOR TO ADMISSION: The patient lives alone and had a paid caregiver 6 hours/day    Physical Therapy Goals  Initiated 07/29/2021  1.  Patient will move from supine to sit and sit to supine  in bed with minimal assistance/contact guard assist within 7 day(s).    2.  Patient will transfer from bed to chair and chair to bed with moderate assistance  using the least restrictive device within 7 day(s).  3.  Patient will perform sit to stand with moderate assistance  within 7 day(s).  Outcome: Not Progressing Towards Goal   PHYSICAL THERAPY TREATMENT  Patient: Michele Mejia (80 y.o. female)  Date: 08/01/2021  Diagnosis: Encounter for rehabilitation [Z51.89] Encounter for rehabilitation      Precautions: Contact, Fall, Other (comment) (covid)  Chart, physical therapy assessment, plan of care and goals were reviewed.    ASSESSMENT  Patient continues with skilled PT services and is not progressing towards goals. Today with PT & OT in room attempting to assist pt to even sit on the side of the bed pt was unable to do this  We attempted several times to assist her by helping with her LEs - pt said no it hurts less if I do it  then pt tried to get LEs off of the side of the bed and was unable to do it due to pain in L hip.  Pt is unable to move without severe pain and hip has an audible click. Had pt perform some ankle pups and circles in sidelying, and also talked to her about gluteal squeezes which she was able to do.  This is very difficult patient situation as PT/OT were unable to assist her today.  Same yesterday with another therapist.  Pt seems to have declined since early August where she stood (though unable to walk). Will attempt another day or two and  reassess..     Current Level of Function Impacting Discharge (mobility/balance): total dependency    Other factors to consider for discharge: needs LTC or hip surgery         PLAN :  Patient continues to benefit from skilled intervention to address the above impairments.  Continue treatment per established plan of care.  to address goals.    Recommendation for discharge: (in order for the patient to meet his/her long term goals)  To be determined:      This discharge recommendation:  Has been made in collaboration with the attending provider and/or case management    IF patient discharges home will need the following DME: to be determined (TBD)       SUBJECTIVE:   Patient stated "My hip hurts so bad.  I can't move it (Pt had pain medicine)."    OBJECTIVE DATA SUMMARY:   Critical Behavior:  Neurologic State: Alert     Cognition: Follows commands  Safety/Judgement: Awareness of environment  Functional Mobility Training:  Bed Mobility:  Rolling: Maximum assistance                 Transfers:      unable                             Balance:  Sitting:  (unable)  Ambulation/Gait Training:         N/A                                               Stairs:              Therapeutic Exercises:   Ankle pumps & gluteal sets  Pain Rating:  10/10    Activity Tolerance:   Poor    After treatment patient left in no apparent distress:   Supine in bed    COMMUNICATION/COLLABORATION:   The patient's plan of care was discussed with: Occupational therapist and Case management.     Enzo Montgomery, PT

## 2021-08-01 NOTE — Progress Notes (Signed)
Problem: Self Care Deficits Care Plan (Adult)  Goal: *Acute Goals and Plan of Care (Insert Text)  Description: FUNCTIONAL STATUS PRIOR TO ADMISSION: Pt currently needs a hip replacement. She has caregivers with her for part of the day 7 days a week.  Pain is her limiting factor and since she's not been getting up she has more issues with strength and endurance to stand.     HOME SUPPORT: Pt has family and paid caregiver.    Occupational Therapy Goals  Initiated 07/31/2021  1.  Patient will perform upper body dressing with supervision/set-up within 7 day(s).  2.  Patient will perform bathing with modified independence within 7 day(s).  3.  Patient will perform lower body dressing with moderate assistance  within 7 day(s).  4.  Patient will perform toilet transfers with moderate assistance  within 7 day(s).  5.  Patient will perform all aspects of toileting with moderate assistance  within 7 day(s).  6.  Patient will participate in upper extremity therapeutic exercise/activities with supervision/set-up for 8 minutes within 7 day(s).    7.  Patient will utilize energy conservation techniques during functional activities with verbal cues within 7 day(s).    Outcome: Not Met    OCCUPATIONAL THERAPY TREATMENT  Patient: Michele Mejia (80 y.o. female)  Date: 08/01/2021  Diagnosis: Encounter for rehabilitation [Z51.89] Encounter for rehabilitation      Precautions: Contact, Fall, Other (comment) (covid)  Chart, occupational therapy assessment, plan of care, and goals were reviewed.    ASSESSMENT  Patient continues with skilled OT services. Pain was a significant factor this session; rated 10/10. RN aware and patient recently had pain medication (about 30 minutes prior to session). Patient attempted to move supine to sit. She would not allow therapist to assist her because she reports it "hurts less if I do it". After more than 10 minutes, patient had not yet gotten her feet off of the edge of the bed. Attempted to assist  her, but she screamed out in pain. Unable to tolerate out of bed activity at this time. Positioned patient on her left side (her preference) for comfort with multiple pillows. Patient encouraged to complete UE exercises while in bed. Continue per plan of care.         PLAN :  Patient continues to benefit from skilled intervention to address the above impairments.  Continue treatment per established plan of care to address goals.    Recommendation for discharge: (in order for the patient to meet his/her long term goals)  Therapy up to 5 days/week in SNF setting    This discharge recommendation:  Has been made in collaboration with the attending provider and/or case management    IF patient discharges home will need the following DME: TBD       SUBJECTIVE:   Patient stated ???I'm sorry. It just hurts too much.???    OBJECTIVE DATA SUMMARY:   Cognitive/Behavioral Status:        Cognition: Follows commands     Perseveration: No perseveration noted       Functional Mobility and Transfers for ADLs:  Bed Mobility:  Patient attempted to move supine to sit. She would not allow therapist to assist her because she reports it "hurts less if I do it". After more than 10 minutes, patient had not yet gotten her feet off of the edge of the bed. Attempted to assist her, but she screamed out in pain. Unable to tolerate out of bed activity at this  time. Positioned patient on her left side (her preference) for comfort with multiple pillows. Patient encouraged to complete UE exercises while in bed.          Pain:  10/10 L hip and L lower back    Activity Tolerance:   Poor    After treatment patient left in no apparent distress:   Patient positioned in left sidelying for pressure relief and Call bell within reach    COMMUNICATION/COLLABORATION:   The patient???s plan of care was discussed with: Physical therapist, Registered nurse, and Case management.     Holly Bodily, OTR/L  Time Calculation: 24 mins

## 2021-08-01 NOTE — Progress Notes (Signed)
IDR Team:  MD, Nursing, Care Manager, Respiratory therapy ,Chaplain met to review patient's plan of care. Discussed goals, interventions, barriers and progress.    RUR:14%  LOW    Team will continue to monitor progress and report any concerns to the physician and care management as indicated.    Transition of Care Plan:    Received a phone call from [place] at [facility] that the agency will not be able to provide 24/7 care for the patient even with respite hours. Informed the patient of this and she has agreed to have information faxed to [facility] for consideration of skilled care services. Medical information faxed to [facility].

## 2021-08-02 LAB — CULTURE, BLOOD, PAIRED
Culture result:: NO GROWTH
Culture: NO GROWTH

## 2021-08-02 MED FILL — GABAPENTIN 100 MG CAP: 100 mg | ORAL | Qty: 1

## 2021-08-02 MED FILL — PANTOPRAZOLE 40 MG TAB, DELAYED RELEASE: 40 mg | ORAL | Qty: 1

## 2021-08-02 MED FILL — MAGNESIUM OXIDE 400 MG TAB: 400 mg | ORAL | Qty: 1

## 2021-08-02 MED FILL — DILTIAZEM ER 120 MG 24 HR CAP: 120 mg | ORAL | Qty: 1

## 2021-08-02 MED FILL — SPIRONOLACTONE 25 MG TAB: 25 mg | ORAL | Qty: 1

## 2021-08-02 MED FILL — FERROUS SULFATE 324 MG (65 MG IRON) TAB, DELAYED RELEASE: 324 mg (65 mg iron) | ORAL | Qty: 1

## 2021-08-02 MED FILL — ATENOLOL 25 MG TAB: 25 mg | ORAL | Qty: 2

## 2021-08-02 MED FILL — CLOPIDOGREL 75 MG TAB: 75 mg | ORAL | Qty: 1

## 2021-08-02 MED FILL — ALLOPURINOL 100 MG TAB: 100 mg | ORAL | Qty: 1

## 2021-08-02 MED FILL — PRAVASTATIN 20 MG TAB: 20 mg | ORAL | Qty: 2

## 2021-08-02 MED FILL — MONTELUKAST 10 MG TAB: 10 mg | ORAL | Qty: 1

## 2021-08-02 MED FILL — LIDOCAINE PAIN RELIEF 4 % TOPICAL PATCH: 4 % | CUTANEOUS | Qty: 2

## 2021-08-02 MED FILL — HYDROCODONE-ACETAMINOPHEN 5 MG-325 MG TAB: 5-325 mg | ORAL | Qty: 1

## 2021-08-02 MED FILL — XARELTO 15 MG TABLET: 15 mg | ORAL | Qty: 1

## 2021-08-02 MED FILL — SERTRALINE 50 MG TAB: 50 mg | ORAL | Qty: 1

## 2021-08-02 NOTE — Progress Notes (Signed)
[facility] received from [place] at [facility]. Sent that to [place] at [facility]. Made MD aware dc for patient is tomorrow to [facility]       [facility] notification of discharge explained to the patient that [facility]/ Skilled Services will end on 08/04/2021 and that the date of financial liability will begin on 08/05/2021  Provided a copy of the notification to the patient and copy placed in the patient's chart.

## 2021-08-02 NOTE — Progress Notes (Signed)
Problem: Self Care Deficits Care Plan (Adult)  Goal: *Acute Goals and Plan of Care (Insert Text)  Description: FUNCTIONAL STATUS PRIOR TO ADMISSION: Pt currently needs a hip replacement. She has caregivers with her for part of the day 7 days a week.  Pain is her limiting factor and since she's not been getting up she has more issues with strength and endurance to stand.     HOME SUPPORT: Pt has family and paid caregiver.    Occupational Therapy Goals  Initiated 07/31/2021  1.  Patient will perform upper body dressing with supervision/set-up within 7 day(s).  2.  Patient will perform bathing with modified independence within 7 day(s).  3.  Patient will perform lower body dressing with moderate assistance  within 7 day(s).  4.  Patient will perform toilet transfers with moderate assistance  within 7 day(s).  5.  Patient will perform all aspects of toileting with moderate assistance  within 7 day(s).  6.  Patient will participate in upper extremity therapeutic exercise/activities with supervision/set-up for 8 minutes within 7 day(s).    7.  Patient will utilize energy conservation techniques during functional activities with verbal cues within 7 day(s).    Outcome: Not Progressing Towards Goal   OCCUPATIONAL THERAPY TREATMENT  Patient: Michele Mejia (80 y.o. female)  Date: 08/02/2021  Diagnosis: Encounter for rehabilitation [Z51.89] Encounter for rehabilitation      Precautions: Contact, Fall, Other (comment) (covid)  Chart, occupational therapy assessment, plan of care, and goals were reviewed.    ASSESSMENT  Patient continues with skilled OT services and is not progressing towards goals.  Although pt is able to get to EOB with assist of 1 and additional time she was doing this on Monday for this OTR. She is able to wash to calves again and do grooming sitting EOB but again this was the same as Monday.     Current Level of Function Impacting Discharge (ADLs): Hip pain prevents progression for getting to EOB w/less  assist, scooting, reaching to feet to wash. She continues to be unable to fully weight bear through the L hip which prevents transfers. She is not a hoyer lift appropriate candidate and per PT on last attempt could not pull to stand to use the best move device.    Other factors to consider for discharge: plan for pt to go to LTC or SNF until she can have hip replaced         PLAN :  Patient continues to benefit from skilled intervention to address the above impairments.  Continue treatment per established plan of care to address goals.    Recommend with staff: let pt move herself with additional time. Remove Purewick to allow pt to sit EOB for longer period    Recommend next OT session: remove purwick and get pt to EOB, attempt best move again    Recommendation for discharge: (in order for the patient to meet his/her long term goals)  Therapy up to 5 days/week in SNF setting versus LTC    This discharge recommendation:  Has been made in collaboration with the attending provider and/or case management    IF patient discharges home will need the following DME: none       SUBJECTIVE:   Patient stated "Can you take this out it's uncomfortable." Purewick device while sitting EOB. Could not remove as pt unable to lean back and spread legs so she laid back down on her side. Nursing in room and aware.  OBJECTIVE DATA SUMMARY:   Cognitive/Behavioral Status:  Neurologic State: Alert     Cognition: Follows commands             Functional Mobility and Transfers for ADLs:  Bed Mobility:  Rolling: Assist x1  Supine to Sit: Assist x1;Additional time;Moderate assistance  Sit to Supine: Assist x1;Additional time;Moderate assistance  Scooting: Assist x1 (on side is dependent but sitting EOB min-SBA)    Transfers:             Balance:       ADL Intervention:       Grooming  Position Performed: Seated edge of bed  Washing Face: Set-up  Washing Hands: Set-up  Brushing Teeth: Set-up  Shaving: Set-up    Upper Body Bathing  Bathing  Assistance: Independent  Position Performed: Seated edge of bed    Type of Bath: Basin/Soap/Water    Lower Body Bathing  Lower Body : Moderate assistance  Position Performed: Seated edge of bed    Upper Body Dressing Assistance  Hospital Gown: Minimum  assistance    Lower Body Dressing Assistance  Socks: Total assistance (dependent)    Pain:  Sharp pain with movement L hip    Activity Tolerance:   Fair    After treatment patient left in no apparent distress:   Patient positioned in Left sidelying for pressure relief, Call bell within reach, Bed / chair alarm activated, Caregiver / family present, and Side rails x 3. Pt said it would be more painful to lay on R side    COMMUNICATION/COLLABORATION:   The patient's plan of care was discussed with: Physical therapy assistant and Registered nurse.     Margaretmary Eddy, OT  Time Calculation: 30 mins

## 2021-08-02 NOTE — Progress Notes (Signed)
Shanda Bumps from [facility] out today. Spoke with Kasey at [facility]. She is following up with patient's referral. Asked if she had a cpap. Per nurse, she does NOT use a CPAP. Rosanne Sack is requesting a UAI prior to acceptance. Called [facility] and spoke with [place]. She will send UAI to me and I will send to Sharon. Awaiting UAI      Called Ms. Robidoux. Explained to her that it is in her best interest to go to [facility] for skilled care/PT/OT. She is relectant but agrees she needs to go. Plan for discharge to [facility] tomorrow 8/18. Janean Sark also aware.

## 2021-08-02 NOTE — Progress Notes (Signed)
 Comprehensive Nutrition Assessment    Type and Reason for Visit: Initial    Nutrition Recommendations/Plan:   Regular 3 carb choice diet low fat/low chol/high fiber/NAS  High protein jello with meals   Encourage po intake      Malnutrition Assessment:  Malnutrition Status:  Mild malnutrition (08/02/21 1645)    Context:  Acute illness     Findings of the 6 clinical characteristics of malnutrition:   Energy Intake:  75% or less of est energy req for 7 or more days  Weight Loss:  No significant weight loss     Body Fat Loss:  No significant body fat loss,     Muscle Mass Loss:  No significant muscle mass loss,    Fluid Accumulation:  No significant fluid accumulation,    Grip Strength:  Not performed     Nutrition Assessment:  Present on Admission:   Encounter for rehabilitation   Fall from ground level   Hip osteoarthritis   Anemia   AV (angiodysplasia malformation of colon)   GERD (gastroesophageal reflux disease)   COVID-19   COPD (chronic obstructive pulmonary disease) (HCC)   PAF (paroxysmal atrial fibrillation) (HCC)   HCVD (hypertensive cardiovascular disease)   CKD (chronic kidney disease)   T2DM (type 2 diabetes mellitus) (HCC)   Dyslipidemia    Pt admitted with above to rehab. Her weight is stable, no wt loss. On a diabetic diet. Has wound to back side-could use some extra protein for wound healing, would try protein jello. Need to encourage better intake. Blood sugars ranging good to fair, no a1c%. On diuretic-bilateral LE edema 1+.      Nutrition Related Findings:      Wound Type: Pressure injury, labs-blood sugars-97-161, H/H-10.1/31.6 low, Cr-1.21 high meds-reviewed     Current Nutrition Intake & Therapies:  Average Meal Intake: 26-50%  Average Supplement Intake: None ordered  ADULT DIET Regular; 3 carb choices (45 gm/meal); Low Fat/Low Chol/High Fiber/NAS  ADULT ORAL NUTRITION SUPPLEMENT Breakfast, Lunch, Dinner; Protein Modular  Patient Vitals for the past 168 hrs:   % Diet Eaten   08/02/21 1200 1 - 25%    08/02/21 1000 1 - 25%   08/01/21 0923 1 - 25%   07/30/21 0941 26 - 50%   07/29/21 1800 1 - 25%   07/29/21 1200 26 - 50%   07/29/21 0800 1 - 25%       Anthropometric Measures:  Height: 5' 7 (170.2 cm)  Ideal Body Weight (IBW): 135 lbs (61 kg)  Current Body Wt:  113.4 kg (250 lb), 185.2 % IBW.    Current BMI (kg/m2): 39.1  BMI Category: Obese class 2 (BMI 35.0-39.9)  Weight Loss Metrics 07/28/2021 07/23/2021 06/15/2021 12/08/2020 05/05/2020 11/05/2019 02/10/2019   Today's Wt - 250 lb - - 238 lb 233 lb 225 lb   BMI 39.16 kg/m2 39.16 kg/m2 37.28 kg/m2 37.28 kg/m2 37.28 kg/m2 36.49 kg/m2 35.24 kg/m2         Estimated Daily Nutrient Needs:  Energy Requirements Based On: Formula  Weight Used for Energy Requirements: Admission  Energy (kcal/day): 2307  Weight Used for Protein Requirements: Admission  Protein (g/day): 91  Method Used for Fluid Requirements: 1 ml/kcal  Fluid (ml/day): 2307    Nutrition Diagnosis:   Inadequate protein intake, Inadequate oral intake related to increased demand for energy/nutrients as evidenced by intake 26-50%, wounds    Nutrition Interventions:   Food and/or Nutrient Delivery: Continue current diet, Start oral nutrition supplement  Nutrition Education/Counseling: No  recommendations at this time, Education not indicated  Coordination of Nutrition Care: Continue to monitor while inpatient, Interdisciplinary rounds       Goals:     Goals: PO intake 50% or greater, within 7 days       Nutrition Monitoring and Evaluation:   Behavioral-Environmental Outcomes: None identified  Food/Nutrient Intake Outcomes: Food and nutrient intake, Supplement intake  Physical Signs/Symptoms Outcomes: Biochemical data, Weight    Discharge Planning:    Continue current diet    Delon Pinal, RD

## 2021-08-03 LAB — BASIC METABOLIC PANEL
Anion Gap: 6 mmol/L (ref 5–15)
BUN: 19 MG/DL (ref 6–20)
Bun/Cre Ratio: 15 (ref 12–20)
CO2: 30 mmol/L (ref 21–32)
Calcium: 9.3 MG/DL (ref 8.5–10.1)
Chloride: 101 mmol/L (ref 97–108)
Creatinine: 1.29 MG/DL — ABNORMAL HIGH (ref 0.55–1.02)
EGFR IF NonAfrican American: 40 mL/min/{1.73_m2} — ABNORMAL LOW (ref >60)
GFR African American: 48 mL/min/{1.73_m2} — ABNORMAL LOW (ref >60)
Glucose: 125 mg/dL — ABNORMAL HIGH (ref 65–100)
Potassium: 5.6 mmol/L — ABNORMAL HIGH (ref 3.5–5.1)
Sodium: 137 mmol/L (ref 136–145)

## 2021-08-03 LAB — IRON AND TIBC
Iron Saturation: 16 % — ABNORMAL LOW (ref 20–50)
Iron: 33 ug/dL — ABNORMAL LOW (ref 50–170)
TIBC: 201 ug/dL — ABNORMAL LOW (ref 250–450)

## 2021-08-03 LAB — VITAMIN B12 & FOLATE
Folate: 5.5 ng/mL (ref 5.0–21.0)
Folate: 5.5 ng/mL (ref 5.0–21.0)
Vitamin B-12: 455 pg/mL (ref 193–986)
Vitamin B12: 455 pg/mL (ref 193–986)

## 2021-08-03 LAB — HEMOGLOBIN AND HEMATOCRIT
Hematocrit: 33.7 % — ABNORMAL LOW (ref 35.0–47.0)
Hemoglobin: 10.6 g/dL — ABNORMAL LOW (ref 11.5–16.0)

## 2021-08-03 LAB — FERRITIN
Ferritin: 934 NG/ML — ABNORMAL HIGH (ref 8–252)
Ferritin: 934 NG/ML — ABNORMAL HIGH (ref 8–252)

## 2021-08-03 LAB — METABOLIC PANEL, BASIC
Anion gap: 6 mmol/L (ref 5–15)
BUN/Creatinine ratio: 15 (ref 12–20)
BUN: 19 MG/DL (ref 6–20)
CO2: 30 mmol/L (ref 21–32)
Calcium: 9.3 MG/DL (ref 8.5–10.1)
Chloride: 101 mmol/L (ref 97–108)
Creatinine: 1.29 MG/DL — ABNORMAL HIGH (ref 0.55–1.02)
GFR est AA: 48 mL/min/{1.73_m2} — ABNORMAL LOW (ref >60)
GFR est non-AA: 40 mL/min/{1.73_m2} — ABNORMAL LOW (ref >60)
Glucose: 125 mg/dL — ABNORMAL HIGH (ref 65–100)
Potassium: 5.6 mmol/L — ABNORMAL HIGH (ref 3.5–5.1)
Sodium: 137 mmol/L (ref 136–145)

## 2021-08-03 LAB — IRON PROFILE
Iron % saturation: 16 % — ABNORMAL LOW (ref 20–50)
Iron: 33 ug/dL — ABNORMAL LOW (ref 50–170)
TIBC: 201 ug/dL — ABNORMAL LOW (ref 250–450)

## 2021-08-03 LAB — HGB & HCT
HCT: 33.7 % — ABNORMAL LOW (ref 35.0–47.0)
HGB: 10.6 g/dL — ABNORMAL LOW (ref 11.5–16.0)

## 2021-08-03 MED ORDER — ALBUTEROL SULFATE HFA 90 MCG/ACTUATION AEROSOL INHALER
90 mcg/actuation | RESPIRATORY_TRACT | Status: DC | PRN
Start: 2021-08-03 — End: 2021-08-03

## 2021-08-03 MED ORDER — HYDROCODONE-ACETAMINOPHEN 5 MG-325 MG TAB
5-325 mg | ORAL_TABLET | Freq: Four times a day (QID) | ORAL | 0 refills | Status: AC | PRN
Start: 2021-08-03 — End: 2021-08-10

## 2021-08-03 MED ORDER — LIDOCAINE 4 % TOPICAL PATCH (12 HOUR DURATION)
4 % | MEDICATED_PATCH | CUTANEOUS | 0 refills | Status: AC
Start: 2021-08-03 — End: ?

## 2021-08-03 MED ORDER — ALBUTEROL SULFATE HFA 90 MCG/ACTUATION AEROSOL INHALER
90 mcg/actuation | RESPIRATORY_TRACT | 0 refills | Status: AC | PRN
Start: 2021-08-03 — End: 2021-12-19

## 2021-08-03 MED ORDER — MOMETASONE-FORMOTEROL HFA 200 MCG-5 MCG/ACTUATION AEROSOL INHALER
200-5 mcg/actuation | Freq: Two times a day (BID) | RESPIRATORY_TRACT | 0 refills | Status: DC
Start: 2021-08-03 — End: 2021-11-20

## 2021-08-03 MED FILL — GABAPENTIN 100 MG CAP: 100 mg | ORAL | Qty: 1

## 2021-08-03 MED FILL — FERROUS SULFATE 324 MG (65 MG IRON) TAB, DELAYED RELEASE: 324 mg (65 mg iron) | ORAL | Qty: 1

## 2021-08-03 MED FILL — SPIRONOLACTONE 25 MG TAB: 25 mg | ORAL | Qty: 1

## 2021-08-03 MED FILL — DILTIAZEM ER 120 MG 24 HR CAP: 120 mg | ORAL | Qty: 1

## 2021-08-03 MED FILL — ATENOLOL 25 MG TAB: 25 mg | ORAL | Qty: 2

## 2021-08-03 MED FILL — MAGNESIUM OXIDE 400 MG TAB: 400 mg | ORAL | Qty: 1

## 2021-08-03 MED FILL — XARELTO 15 MG TABLET: 15 mg | ORAL | Qty: 1

## 2021-08-03 MED FILL — ACETAMINOPHEN 325 MG TABLET: 325 mg | ORAL | Qty: 2

## 2021-08-03 MED FILL — ALLOPURINOL 100 MG TAB: 100 mg | ORAL | Qty: 1

## 2021-08-03 MED FILL — PRAVASTATIN 20 MG TAB: 20 mg | ORAL | Qty: 2

## 2021-08-03 MED FILL — PANTOPRAZOLE 40 MG TAB, DELAYED RELEASE: 40 mg | ORAL | Qty: 1

## 2021-08-03 MED FILL — MONTELUKAST 10 MG TAB: 10 mg | ORAL | Qty: 1

## 2021-08-03 MED FILL — SERTRALINE 50 MG TAB: 50 mg | ORAL | Qty: 1

## 2021-08-03 MED FILL — CLOPIDOGREL 75 MG TAB: 75 mg | ORAL | Qty: 1

## 2021-08-03 NOTE — Discharge Summary (Signed)
Discharge Summary by Charlynn Grimes, MD at 08/03/21 1208                Author: Charlynn Grimes, MD  Service: Hospitalist  Author Type: Physician       Filed: 08/03/21 1251  Date of Service: 08/03/21 1208  Status: Addendum          Editor: Charlynn Grimes, MD (Physician)          Related Notes: Original Note by Charlynn Grimes, MD (Physician) filed at 08/03/21 [facility]   Hospitalist Discharge Summary      Patient ID:   Michele Mejia   [id number]   80 y.o.   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 female presenting for admission to [facility] for further evaluation and treatment for Encounter for rehabilitation.  She  has a past  medical history of Anemia, Asthma, Change in bowel habits, CKD (chronic kidney disease), COPD (chronic obstructive pulmonary disease) ([place]), Depression, DJD (degenerative joint disease), GERD (gastroesophageal reflux disease), Glaucoma, Gout, HCVD (hypertensive  cardiovascular disease), Obesity, OSA (obstructive sleep apnea), PAF (paroxysmal atrial fibrillation) ([place]) (12/08/2020), Rectal bleed, Ringing in ear, and  TIA (transient ischemic attack).70        80 year old female with past medical history of advanced osteoarthritis of the hip, type 2 diabetes mellitus, OSA, COPD, GI bleed, obesity, dyslipidemia, TIA, CKD stage IIIa, paroxysmal A. fib on anticoagulation  presented with recent fall and not being able to do weightbearing.  Dx with COVID 19.  Slow progress due to marked hip pains.    Tx with IV Venofer for Fe Def Anemia during acute care.  Acute care 8/7 till 8/12.  Attempting to improve functional status to prep for [facility] hip surgery.    ______________________________________________________________________   DISCHARGE SUMMARY/HOSPITAL COURSE:   for full details see H&P, daily progress notes, labs, consult notes.       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  Patient is followed at [facility] by orthopedics.  Surgery has apparently been delayed due to her iron deficiency and  anemia as well as poor success on weight reduction.  Patient was evaluated by physical and Occupational Therapy.  Therapy was hampered by the severe pain with limited activity levels.  Patient is not able to be managed at home at this time.  Received  iron supplementation orally as well as intravenously with Venofer.  Patient is transferred to skilled rehab for continued efforts at pain management and improved functional status.   [facility] was contacted and she does have follow-up there to discussed therapy  plans patient has refused blood transfusion and was assessed by hematology.  She received some IV iron at [facility] as well as during this acute stay.  Repeat iron profile suggests anemia of chronic disease with adequate iron stores with a high serum ferritin  reported.  Vitamin B12 level is pending.  Patient has mild renal insufficiency with a creatinine clearance in the 40s, and erythropoietin level was obtained and is pending at discharge.  [facility] orthopedics notes they frequently administer weekly erythropoietin   for the month prior to planned surgery once a date is set. The level needs to be low for [facility] to approve.  Lab pening         OSA (obstructive sleep apnea) ()     Obesity (08/18/2014)   Patient needs continued efforts for diet and weight reduction.  Activity limited due to her arthritic pains.  BMI is 39.  Patient received nutritional consultation.         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  Oral iron supplementation maintained.  Patient received IV infusion iron.  Gastroesophageal reflux was managed with PPI.         COVID-19 (07/24/2021)   Diagnosed and treated for COVID-19 8/8.   Further functional decline was noted during this acute illness.   Admitted for physical and occupational therapy to improve prove her functional status.   Droplet Plus isolation required limiting rehab options as well,  today has completed 10 days since diagnosis and is released from Droplet Plus Isolation   Needs for bronchodilators reduced.  Albuterol switched to prn use only         T2DM (type 2 diabetes mellitus) ([place]) (03/13/2013)   Hemoglobin A1c fairly well controlled at 7.3.  Monitor blood glucose before meals and at bedtime.  Diabetic education.  Insulin sliding scale.   No current tx.  Suggest  following daily FBS for now         Dyslipidemia (08/18/2014)   Maintain home treatment with Pravachol 40 mg daily         HCVD (hypertensive cardiovascular disease) ()   Stable on routine treatment with Atenolol, Diltiazem, Aldactone.   Aldactone discontinued due to high normal  K+ value - should recheck in 3-4 days         CKD (chronic kidney disease) ()   Stage IIIa-stable.  Avoid nephrotoxic medication.  Renally adjust medication doses.   Erythropoietin level is pending - will be offered Epogen if qualifies         PAF (paroxysmal atrial fibrillation) ([place]) (12/08/2020)   CHADs2-VASC 7.  Rate controlled.  Continue treatment with Xarelto, Atenolol, Cardizem. Stop Aldactone   Symptoms stable.         COPD (chronic obstructive pulmonary disease) ([facility]) ()   Stable on routine treatment with Dulera, Spiriva, Albuterol         _______________________________________________________________________   Patient seen and examined by me on discharge day.   Pertinent Findings:   Visit Vitals      BP  132/64 (BP 1 Location: Left upper arm, BP Patient Position: At rest)        Pulse  80     Temp  97.8 ??F (36.6 ??C)     Resp  20     Ht  '5\' 7"'  (1.702 m)     SpO2  94%        BMI  39.16 kg/m??        Gen:    Not in distress   Chest: Nonlabored respiration, Clear lungs   CVS:   Regular rhythm.  No edema   Abd:  Soft, obese, not distended, not tender   Neuro:  Alert, nonfocal, weak      LABS:           07/23/21 17:43  07/26/21 05:59  07/31/21 05:35  08/03/21 08:25           WBC  8.4  7.5  8.1       NRBC  0.0  0.0  0.0       RBC  3.40 (L)  3.20 (L)  3.33 (L)       HGB  10.3 (L)  9.7 (L)  10.1 (L)  10.6 (L)     HCT  32.7 (L)  30.6 (L)  31.6 (L)  33.7 (L)     MCV  96.2  95.6  94.9       MCH  30.3  30.3  30.3       MCHC  31.5  31.7  32.0       RDW  14.7 (H)  14.4  14.0       PLATELET  197  170  244       MPV  9.4  9.2  9.4       NEUTROPHILS  84 (H)    71       BAND NEUTROPHILS  3           LYMPHOCYTES  5 (L)    18       MONOCYTES  5    6              EOSINOPHILS  1    5       (L): Data is abnormally low   (H): Data is abnormally high           07/24/21 01:37        Color  YELLOW/STRAW     Appearance  CLEAR     Specific gravity  1.015     pH (UA)  6.0     Protein  Negative     Glucose  Negative     Ketone  Negative     Blood  Negative     Bilirubin  Negative     Urobilinogen  0.2     Nitrites  Negative        Leukocyte Esterase  Negative                07/23/21 17:43        INR  1.3 (H)        Prothrombin time  12.7 (H)     (H): Data is abnormally high                  07/23/21 17:43  07/24/21 13:08  07/25/21 11:45  07/26/21 05:59  07/27/21 06:28  07/31/21 05:35  08/01/21 05:53  08/03/21 08:25               Sodium  138      138    137    137     Potassium  4.3      4.4    4.9    5.6 (H)     Chloride  104      103    100    101     CO2  '26      26    30    30     ' Anion gap  '8      9    7    6     ' Glucose  184 (H)      124 (H)    112 (H)    125 (H)     BUN  '17      16    18    19     ' Creatinine  1.27 (H)      1.17 (H)    1.21 (H)    1.29 (H)     BUN/Creatinine ratio  '13      14    15    15     ' Calcium  8.5      8.5    8.9    9.3     Phosphorus    2.8                 Magnesium    1.5 (L)        2.0         GFR est non-AA  41 (L)      45 (L)    43 (L)    40 (L)     GFR est AA  49 (L)      54 (L)    52 (L)    48 (L)     Bilirubin, total  0.4                   Protein, total  6.7                   Albumin  2.9 (L)                   Globulin  3.8                   A-G Ratio  0.8 (L)                   ALT  13                   AST  14 (L)  Alk. phosphatase  81                   Lactic acid          0.8           Hemoglobin A1c, (calculated)    7.3 (H)                 Est. average glucose    163                 Iron      39 (L)        27 (L)  33 (L)     TIBC      218 (L)        193 (L)  201 (L)     Iron % saturation      18 (L)        14 (L)  16 (L)               Ferritin                934 (H)     (H): Data is abnormally high   (L): Data is abnormally low       CULTURES   Paired BC 8/11:  NG x 6d           07/23/21 17:53        Influenza A by PCR  Not detected     Influenza B by PCR  Not detected        SARS-COV-2  PCR  DETECTED             08/03/21 08:25        ERYTHROPOIETIN  pending              RADIOLOGY   CT L HIP 8/7:   Diffuse osteopenia limits evaluation.    Bones: Minimal subchondral collapse superior femoral head (5-22), which is not   definitely seen on 02/01/2020 radiographs given differences technique. No malalignment.    Joint fluid: Moderate sized left hip joint effusion    Articulations: Severe left hip joint osteoarthritis. Partially visualized severe   left sacroiliac joint osteoarthritis. Mild pubic symphysis arthropathy    Tendons: No definite full-thickness tendon tear.    Muscles: No intramuscular hematoma. No focal atrophy.    Soft tissues: Scattered atherosclerosis. Diverticulosis coli.    IMPRESSION   Severe left hip joint osteoarthritis. Subchondral femoral head collapse, which   was not definitely seen on 02/01/2020 radiographs given differences in technique,   but is still an age-indeterminate finding       pCXR 8/7:   The lungs are clear. Heart size and mediastinal contours are normal.   No pleural effusion or pneumothorax. Bones are age-appropriate.    IMPRESSION: No acute cardiopulmonary abnormality.       L FEMUR 8/7:   L HIP / PELVIS 8/7:   There is a right hip arthroplasty. There is deformity of the left   femoral head with a foreshortened position of the femoral head compared to the   prior radiograph from February 2021. Lumbar spine hardware is partially imaged.   The pelvis is otherwise intact.    2 views of the left femur demonstrate deformity of the femoral head. No femoral   shaft fracture. There are degenerative changes of the distal femur at the knee   joint. Vascular calcifications are noted throughout the left lower leg.    IMPRESSION  Deformity and flattening of the left femoral head against the   acetabulum. No femoral neck or  femoral shaft fracture.   Right hip arthroplasty.       EKG 8/7:  NSR 88 bpm, Increased HR since 09 June 2019   _______________________________________________________________________   DISCHARGE MEDICATIONS:      Current Discharge Medication List                 START taking these medications          Details        acetaminophen (TYLENOL) 325 mg tablet  Take 2 Tablets by mouth every four (4) hours as needed.   Start date: 08/03/2021               bisacodyL (DULCOLAX) 10 mg supp  Insert 10 mg into rectum daily as needed for Constipation.   Start date: 08/03/2021               ferrous sulfate 324 mg (65 mg iron) tablet  Take 1 Tablet by mouth daily (with breakfast).   Qty: 30 Tablet, Refills: 0   Start date: 08/04/2021               HYDROcodone-acetaminophen (NORCO) 5-325 mg per tablet  Take 1 Tablet by mouth every six (6) hours as needed for Pain for up to 7 days. Max Daily Amount: 4 Tablets.   Qty: 20 Tablet, Refills: 0   Start date: 08/03/2021, End date: 08/10/2021          Associated Diagnoses: Primary osteoarthritis of both hips               mometasone-formoterol (DULERA) 200-5 mcg/actuation HFA inhaler  Take 2 Puffs by inhalation two (2) times a day.   Qty: 13 g, Refills: 0   Start date: 08/03/2021               magnesium oxide (MAG-OX) 400 mg tablet  Take 1 Tablet by mouth daily.   Start date: 08/03/2021               lidocaine 4 % patch  1 Patch by TransDERmal route every twenty-four (24) hours.   Qty: 15 Patch, Refills: 0   Start date: 08/03/2021                        CONTINUE these medications which have CHANGED          Details        albuterol (PROVENTIL HFA, VENTOLIN HFA, PROAIR HFA) 90 mcg/actuation inhaler  Take 2 Puffs by inhalation every four (4) hours as needed for Wheezing, Shortness of Breath or Respiratory Distress.   Qty: 18 g, Refills: 0   Start date: 08/03/2021                        CONTINUE these medications which have NOT CHANGED          Details        fluticasone propionate (FLONASE NA)  by  Nasal route two (2) times a day.               azelastine-fluticasone 137-50 mcg/spray spry  by Nasal route two (2) times a day.               gabapentin (NEURONTIN) 100 mg capsule  Take 100 mg by mouth three (3) times daily.  montelukast (SINGULAIR) 10 mg tablet  Take 10 mg by mouth nightly.               tiotropium (SPIRIVA) 18 mcg inhalation capsule  Take 1 Cap by inhalation daily.               sertraline (ZOLOFT) 50 mg tablet  Take  by mouth daily.               atenolol (TENORMIN) 50 mg tablet  Take  by mouth daily.               fluticasone propion-salmeteroL (ADVAIR/WIXELA) 500-50 mcg/dose diskus inhaler  Take 1 Puff by inhalation two (2) times a day. Indications: Currently taking 500-67mg/dose diskus one puff twice a day               PRAVASTATIN SODIUM (PRAVASTATIN PO)  Take 40 mg by mouth daily.               cholecalciferol (VITAMIN D3) (2,000 UNITS /50 MCG) cap capsule  Take 2,000 Units by mouth daily.               Xarelto 15 mg tab tablet  TAKE 1 TABLET BY MOUTH EVERY DAY WITH DINNER   Qty: 90 Tablet, Refills: 0          Comments: Follow up needed prior to further refill               pantoprazole sodium (PANTOPRAZOLE PO)  Take 40 mg by mouth daily.               dilTIAZem ER (CARDIZEM CD) 300 mg capsule  Take 300 mg by mouth daily.               CALCIUM PO  Take  by mouth. 1200 DAILY               OTHER  Vitamin d3 2,000 units daily.               clopidogrel (PLAVIX) 75 mg tablet  Take  by mouth daily.               ALLOPURINOL PO  Take 100 mg by mouth two (2) times a day.                        STOP taking these medications                  spironolactone (ALDACTONE) 25 mg tablet  Comments:    Reason for Stopping:                      lactulose (CHRONULAC) 10 gram/15 mL solution  Comments:    Reason for Stopping:                      indapamide (LOZOL) 2.5 mg tablet  Comments:    Reason for Stopping:                             My Recommended   Diet: Cardiac   Activity: Up only with  assistance   Wound Care: none   Follow-up labs: routine,  check for results on pending labs on d/c  B12, Erythropoietin level      ______________________________________________________________________      DISPOSITION:  Home with Family:          Home with HH/PT/OT/RN:          SNF/LTC:  [facility]        SAHR:          OTHER:             Condition at Discharge:  Stable   _____________________________________________________________________   Follow up with:    PCP : Michele Grimes, NP     Follow-up Information                  Follow up With  Specialties  Details  Why  Contact Info              Burno, Sharrell Ku, NP  Nurse Practitioner  Follow up in 2 week(s)  For Hospital and Rehab follow up   To check on pending labs a of 8/18  [address]   [place] [address]   [phone]                  Follow with Warden Fillers "house provider" in next few days      Total time in minutes spent coordinating this discharge (includes going over instructions, follow-up, prescriptions, and preparing report for sign off to her PCP) :35 minutes      Signed:   Charlynn Grimes, MD   [facility] Hospitalist   [phone]

## 2021-08-03 NOTE — Progress Notes (Signed)
Bedside shift change report given to Sophea (oncoming nurse) by Tameka (offgoing nurse). Report included the following information SBAR, Kardex, Intake/Output, MAR, Recent Results, and Med Rec Status.

## 2021-08-03 NOTE — Progress Notes (Signed)
Problem: Airway Clearance - Ineffective  Goal: Achieve or maintain patent airway  Outcome: Resolved/Met     Problem: Gas Exchange - Impaired  Goal: Absence of hypoxia  Outcome: Resolved/Met  Goal: Promote optimal lung function  Outcome: Resolved/Met     Problem: Breathing Pattern - Ineffective  Goal: Ability to achieve and maintain a regular respiratory rate  Outcome: Resolved/Met     Problem: Body Temperature -  Risk of, Imbalanced  Goal: Ability to maintain a body temperature within defined limits  Outcome: Resolved/Met     Problem: Isolation Precautions - Risk of Spread of Infection  Goal: Prevent transmission of infectious organism to others  Outcome: Resolved/Met     Problem: Nutrition Deficits  Goal: Optimize nutrtional status  Outcome: Resolved/Met     Problem: Risk for Fluid Volume Deficit  Goal: Maintain absence of muscle cramping  Outcome: Resolved/Met  Goal: Maintain normal serum potassium, sodium, calcium, phosphorus, and pH  Outcome: Resolved/Met     Problem: Loneliness or Risk for Loneliness  Goal: Demonstrate positive use of time alone when socialization is not possible  Outcome: Resolved/Met     Problem: Fatigue  Goal: Verbalize increase energy and improved vitality  Outcome: Resolved/Met     Problem: Patient Education: Go to Patient Education Activity  Goal: Patient/Family Education  Outcome: Resolved/Met     Problem: Diabetes Self-Management  Goal: *Incorporating nutritional management into lifestyle  Description: Describe effect of type, amount and timing of food on blood glucose; list 3 methods for planning meals.  Outcome: Resolved/Met  Goal: *Incorporating physical activity into lifestyle  Description: State effect of exercise on blood glucose levels.  Outcome: Resolved/Met  Goal: *Developing strategies to promote health/change behavior  Description: Define the ABC's of diabetes; identify appropriate screenings, schedule and personal plan for screenings.  Outcome: Resolved/Met  Goal: *Using  medications safely  Description: State effect of diabetes medications on diabetes; name diabetes medication taking, action and side effects.  Outcome: Resolved/Met  Goal: *Monitoring blood glucose, interpreting and using results  Description: Identify recommended blood glucose targets  and personal targets.  Outcome: Resolved/Met  Goal: *Prevention, detection, treatment of acute complications  Description: List symptoms of hyper- and hypoglycemia; describe how to treat low blood sugar and actions for lowering  high blood glucose level.  Outcome: Resolved/Met  Goal: *Prevention, detection and treatment of chronic complications  Description: Define the natural course of diabetes and describe the relationship of blood glucose levels to long term complications of diabetes.  Outcome: Resolved/Met  Goal: *Developing strategies to address psychosocial issues  Description: Describe feelings about living with diabetes; identify support needed and support network  Outcome: Resolved/Met  Goal: *Insulin pump training  Outcome: Resolved/Met  Goal: *Sick day guidelines  Outcome: Resolved/Met  Goal: *Patient Specific Goal (EDIT GOAL, INSERT TEXT)  Outcome: Resolved/Met     Problem: Patient Education: Go to Patient Education Activity  Goal: Patient/Family Education  Outcome: Resolved/Met     Problem: Falls - Risk of  Goal: *Absence of Falls  Description: Document Schmid Fall Risk and appropriate interventions in the flowsheet.  Outcome: Resolved/Met     Problem: Patient Education: Go to Patient Education Activity  Goal: Patient/Family Education  Outcome: Resolved/Met     Problem: Patient Education: Go to Patient Education Activity  Goal: Patient/Family Education  Outcome: Resolved/Met     Problem: Patient Education: Go to Patient Education Activity  Goal: Patient/Family Education  Outcome: Resolved/Met     Problem: Pressure Injury - Risk of  Goal: *Prevention of  pressure injury  Description: Document Braden Scale and appropriate  interventions in the flowsheet.  Outcome: Resolved/Met     Problem: Patient Education: Go to Patient Education Activity  Goal: Patient/Family Education  Outcome: Resolved/Met

## 2021-08-03 NOTE — Progress Notes (Signed)
Problem: Airway Clearance - Ineffective  Goal: Able to cough effectively  Outcome: Progressing Towards Goal  Goal: Absence of airway secretions  Outcome: Progressing Towards Goal  Goal: Lung sounds clear or within normal limits for patient  Outcome: Progressing Towards Goal     Problem: Bronchodilation Therapy  Goal: Able to breathe comfortably (Bronchodilation Therapy)  Outcome: Progressing Towards Goal

## 2021-08-03 NOTE — Progress Notes (Signed)
Spoke with patient's daughter Michele Mejia. She had questions about why her mom was in skilled care here in a small room. Told her she was in skilled care because she was deemed stable for discharge on 8/12 and couldn't be sent home alone because she is requiring 24/7 care at this point. Told her patient's caregiver was out with COVID and if she were discharged on 8/12 no one would be with her. She stated understanding. She said she was under impression that her mother would remain here till next week. Told her that with patient's condition she would be better served at a SNF rather than swing bed here at the hospital due to equipment and staff that specially work in that department at [facility]. Also told her per therapy team, she has plateaued. She stated understanding and said will call back later.

## 2021-08-03 NOTE — Progress Notes (Signed)
1359: Received voicemail from patient's daughter Gurtha Picker. Called her back . She was concerned patient hadn't had a bath. Will let nursing know. Also on the phone was Eunice Blase (patient's other daughter). She was concerned about patient's insurance. Had various questions about what her insurance covers. Explained LTC is covered with medicaid and skilled care is covered under medicare. She wanted to know why [employer] wouldn't cover 24/7 I told her that was the company had told us. Told her to speak with [employer]. She also had various questions about why patient couldn't get her hip surgery. Explained to her that 1. It was said she needed to loose weigh (SNF may assist with that) and 2. Her blood levels were not up to par. 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 She wanted to speak with hospitalist here. Will see if he will call Debbie back at [phone]    Called daughter to clarify the situation  Pt will keep scheduled follow up appt with [facility]  PAO

## 2021-08-03 NOTE — Progress Notes (Signed)
Discharge summary now completed. Sent to [facility]. Spoke with Baxter Hire from [facility]. She provided me ETA of [phone].

## 2021-08-03 NOTE — Progress Notes (Signed)
Problem: Loneliness or Risk for Loneliness  Goal: Demonstrate positive use of time alone when socialization is not possible  Outcome: Resolved/Met     Problem: Fatigue  Goal: Verbalize increase energy and improved vitality  Outcome: Resolved/Met

## 2021-08-03 NOTE — Progress Notes (Signed)
 Care Management Interventions  PCP Verified by CM: Yes Michele Flake, NP)  Palliative Care Criteria Met (RRAT>21 & CHF Dx)?: No (No MD order for Palliative Care)  Transition of Care Consult (CM Consult): Home Health, Long Term Care  Discharge Durable Medical Equipment: No  Physical Therapy Consult: Yes  Occupational Therapy Consult: Yes  Speech Therapy Consult: No  Support Systems: Child(ren), Other Family Member(s)  Veteran Resource Information Provided?: No  Discharge Location  Patient Expects to be Discharged to:: Skilled nursing facility    Patient is being discharged from swing bed to [facility] for continued stay in skilled care.       Transition of Care (TOC) Plan:  [facility]--skilled care     TOC Transportation:       How is patient being transported at discharge? Squad     When? 1615     Is transport scheduled? N/a     Follow-up appointment and transportation: Patient aware and agrees with discharge plan.     Communication plan (with patient/family): Patient/family aware and agree with dc plan.       Who is being called?  Patient or Next of Kin?  Responsible party? Patient       What number(s) is to be used? [phone]      What service provider is calling for [facility] services? [facility]      When are they calling? Unknown       Click here to complete Clinical research associate of the Healthcare Decision Maker Relationship (ie Primary)  [email or web address]     Transition of Care Plan to SNF/Rehab    SNF/Rehab Transition:  Patient has been accepted to [facility] and meets criteria for admission.   Patient will transported by squad and expected to leave at 1615    Communication to Patient/Family:  Met with patient and daughter Michele Mejia (via phone) (identified care giver) and they are agreeable to the transition plan.    Communication to SNF/Rehab:  Bedside RN, S. Skym has been notified to update the transition plan to the facility and call report (phone number [phone])  Discharge information has been updated  on the AVS.     Discharge instructions to be fax'd to facility at [facility] # [phone])      Nursing Please include all hard scripts for controlled substances, med rec and dc summary, and AVS in packet.     Reviewed and confirmed with facility,, [facility], can manage the patient care needs for the following:     Oneil with (X) only those applicable:    Medication:  [x]   Medications will be available at the facility  []   IV Antibiotics   []   Controlled Substance - hard copy to be sent with patient   []   Weekly Labs   Documents:  []  Hard RX  [x]  MAR  []  Kardex  [x]  AVS  [] Transfer Summary  [x] Discharge   Equipment:  []   CPAP/BiPAP  []   Wound Vacuum  []   Foley or Urinary Device  []   PICC/Central Line  []   Nebulizer  []   Ventilator   Treatment:  [] Isolation (for MRSA, VRE, etc.)  [] Surgical Drain Management  [] Tracheostomy Care  [] Dressing Changes  [] Dialysis with transportation and chair time  [] PEG Care  [] Oxygen  [] Daily Weights for Heart Failure   Dietary:  [] Any diet limitations  [] Tube Feedings   [] Total Parenteral Management (TPN)   Eligible for Medicaid Long Term Services and Supports  Yes:  [x]  Eligible for medical assistance or  will become eligible within 180 days and UAI completed.   []  Provider/Patient and/or support system has requested screening.  []  UAI copy provided to patient or responsible party,  []  UAI unavailable at discharge will send once processed to SNF provider.  []  UAI unavailable at discharged mailed to patient  No:   []  Private pay and is not financially eligible for Medicaid within the next 180 days.  []  Reside out-of-state.  []  A residents of a state owned/operated facility that is licensed  by Department of Behavioral Health and Developmental Services or [facility]  []  Enrollment in [place] hospice services  []  Non [place]  citizen  []  Patient /Family declines to have screening completed or provide financial information for screening     Financial Resources:  Medicaid     []  Initiated and application pending   [x]  Full coverage     Advanced Care Plan:  [] Surrogate Decision Maker of Care  [] POA  [] Communicated Code Status FULL   Other

## 2021-08-04 LAB — ERYTHROPOIETIN
ERYTHROPOIETIN: 16.4 m[IU]/mL (ref 2.6–18.5)
Erythropoietin: 16.4 m[IU]/mL (ref 2.6–18.5)

## 2021-08-16 MED ORDER — XARELTO 15 MG TABLET
15 mg | ORAL_TABLET | ORAL | 1 refills | Status: DC
Start: 2021-08-16 — End: 2021-12-21

## 2021-10-02 ENCOUNTER — Inpatient Hospital Stay
Admit: 2021-10-02 | Discharge: 2021-10-03 | Disposition: A | Discharge status: Home / Self Care | Payer: MEDICARE | Attending: Emergency Medicine

## 2021-10-02 ENCOUNTER — Emergency Department: Admit: 2021-10-02 | Payer: MEDICARE | Primary: Family

## 2021-10-02 DIAGNOSIS — G8929 Other chronic pain: Secondary | ICD-10-CM

## 2021-10-02 LAB — CBC WITH AUTO DIFFERENTIAL
Basophils %: 1 % (ref 0–1)
Basophils Absolute: 0.1 10*3/uL (ref 0.0–0.1)
Eosinophils %: 6 % (ref 0–7)
Eosinophils Absolute: 0.5 10*3/uL — ABNORMAL HIGH (ref 0.0–0.4)
Granulocyte Absolute Count: 0 10*3/uL (ref 0.00–0.04)
Hematocrit: 37.6 % (ref 35.0–47.0)
Hemoglobin: 12 g/dL (ref 11.5–16.0)
Immature Granulocytes %: 0 % (ref 0.0–0.5)
Lymphocytes %: 36 % (ref 12–49)
Lymphocytes Absolute: 2.7 10*3/uL (ref 0.8–3.5)
MCH: 30.2 PG (ref 26.0–34.0)
MCHC: 31.9 g/dL (ref 30.0–36.5)
MCV: 94.5 FL (ref 80.0–99.0)
MPV: 9.3 FL (ref 8.9–12.9)
Monocytes %: 7 % (ref 5–13)
Monocytes Absolute: 0.5 10*3/uL (ref 0.0–1.0)
NRBC Absolute: 0 10*3/uL (ref 0.00–0.01)
Neutrophils %: 50 % (ref 32–75)
Neutrophils Absolute: 3.8 10*3/uL (ref 1.8–8.0)
Nucleated RBCs: 0 PER 100 WBC
Platelets: 303 10*3/uL (ref 150–400)
RBC: 3.98 M/uL (ref 3.80–5.20)
RDW: 15.3 % — ABNORMAL HIGH (ref 11.5–14.5)
WBC: 7.5 10*3/uL (ref 3.6–11.0)

## 2021-10-02 LAB — COMPREHENSIVE METABOLIC PANEL
ALT: 13 U/L (ref 12–78)
AST: 19 U/L (ref 15–37)
Albumin/Globulin Ratio: 0.7 — ABNORMAL LOW (ref 1.1–2.2)
Albumin: 3.1 g/dL — ABNORMAL LOW (ref 3.5–5.0)
Alkaline Phosphatase: 73 U/L (ref 45–117)
Anion Gap: 7 mmol/L (ref 5–15)
BUN/Creatinine Ratio: 14 (ref 12–20)
BUN: 13 MG/DL (ref 6–20)
CO2: 33 mmol/L — ABNORMAL HIGH (ref 21–32)
Calcium: 9.8 MG/DL (ref 8.5–10.1)
Chloride: 101 mmol/L (ref 97–108)
Creatinine: 0.94 MG/DL (ref 0.55–1.02)
Est, Glom Filt Rate: >60 mL/min/{1.73_m2} (ref >60)
Globulin: 4.2 g/dL — ABNORMAL HIGH (ref 2.0–4.0)
Glucose: 133 mg/dL — ABNORMAL HIGH (ref 65–100)
Potassium: 3 mmol/L — ABNORMAL LOW (ref 3.5–5.1)
Sodium: 141 mmol/L (ref 136–145)
Total Bilirubin: 0.4 MG/DL (ref 0.2–1.0)
Total Protein: 7.3 g/dL (ref 6.4–8.2)

## 2021-10-02 LAB — MAGNESIUM
Magnesium: 1.6 mg/dL (ref 1.6–2.4)
Magnesium: 1.6 mg/dL (ref 1.6–2.4)

## 2021-10-02 LAB — LIPASE
Lipase: 53 U/L — ABNORMAL LOW (ref 73–393)
Lipase: 53 U/L — ABNORMAL LOW (ref 73–393)

## 2021-10-02 LAB — CBC WITH AUTOMATED DIFF
ABS. BASOPHILS: 0.1 10*3/uL (ref 0.0–0.1)
ABS. EOSINOPHILS: 0.5 10*3/uL — ABNORMAL HIGH (ref 0.0–0.4)
ABS. IMM. GRANS.: 0 10*3/uL (ref 0.00–0.04)
ABS. LYMPHOCYTES: 2.7 10*3/uL (ref 0.8–3.5)
ABS. MONOCYTES: 0.5 10*3/uL (ref 0.0–1.0)
ABS. NEUTROPHILS: 3.8 10*3/uL (ref 1.8–8.0)
ABSOLUTE NRBC: 0 10*3/uL (ref 0.00–0.01)
BASOPHILS: 1 % (ref 0–1)
EOSINOPHILS: 6 % (ref 0–7)
HCT: 37.6 % (ref 35.0–47.0)
HGB: 12 g/dL (ref 11.5–16.0)
IMMATURE GRANULOCYTES: 0 % (ref 0.0–0.5)
LYMPHOCYTES: 36 % (ref 12–49)
MCH: 30.2 PG (ref 26.0–34.0)
MCHC: 31.9 g/dL (ref 30.0–36.5)
MCV: 94.5 FL (ref 80.0–99.0)
MONOCYTES: 7 % (ref 5–13)
MPV: 9.3 FL (ref 8.9–12.9)
NEUTROPHILS: 50 % (ref 32–75)
NRBC: 0 PER 100 WBC
PLATELET: 303 10*3/uL (ref 150–400)
RBC: 3.98 M/uL (ref 3.80–5.20)
RDW: 15.3 % — ABNORMAL HIGH (ref 11.5–14.5)
WBC: 7.5 10*3/uL (ref 3.6–11.0)

## 2021-10-02 LAB — METABOLIC PANEL, COMPREHENSIVE
A-G Ratio: 0.7 — ABNORMAL LOW (ref 1.1–2.2)
ALT (SGPT): 13 U/L (ref 12–78)
AST (SGOT): 19 U/L (ref 15–37)
Albumin: 3.1 g/dL — ABNORMAL LOW (ref 3.5–5.0)
Alk. phosphatase: 73 U/L (ref 45–117)
Anion gap: 7 mmol/L (ref 5–15)
BUN/Creatinine ratio: 14 (ref 12–20)
BUN: 13 MG/DL (ref 6–20)
Bilirubin, total: 0.4 MG/DL (ref 0.2–1.0)
CO2: 33 mmol/L — ABNORMAL HIGH (ref 21–32)
Calcium: 9.8 MG/DL (ref 8.5–10.1)
Chloride: 101 mmol/L (ref 97–108)
Creatinine: 0.94 MG/DL (ref 0.55–1.02)
Globulin: 4.2 g/dL — ABNORMAL HIGH (ref 2.0–4.0)
Glucose: 133 mg/dL — ABNORMAL HIGH (ref 65–100)
Potassium: 3 mmol/L — ABNORMAL LOW (ref 3.5–5.1)
Protein, total: 7.3 g/dL (ref 6.4–8.2)
Sodium: 141 mmol/L (ref 136–145)
eGFR: >60 mL/min/{1.73_m2} (ref >60)

## 2021-10-02 MED ORDER — OXYCODONE 5 MG TAB
5 mg | ORAL | Status: DC | PRN
Start: 2021-10-02 — End: 2021-10-03
  Administered 2021-10-02 – 2021-10-03 (×2): via ORAL

## 2021-10-02 MED ORDER — HYDROMORPHONE 0.5 MG/0.5 ML SYRINGE
0.5 mg/ mL | Freq: Once | INTRAMUSCULAR | Status: AC
Start: 2021-10-02 — End: 2021-10-02
  Administered 2021-10-02: 19:00:00 via INTRAVENOUS

## 2021-10-02 MED ORDER — IOPAMIDOL 61 % IV SOLN
61 % | Freq: Once | INTRAVENOUS | Status: AC
Start: 2021-10-02 — End: 2021-10-02
  Administered 2021-10-02: 18:00:00 via INTRAVENOUS

## 2021-10-02 MED ORDER — ONDANSETRON (PF) 4 MG/2 ML INJECTION
4 mg/2 mL | INTRAMUSCULAR | Status: AC
Start: 2021-10-02 — End: 2021-10-02
  Administered 2021-10-02: 18:00:00 via INTRAVENOUS

## 2021-10-02 MED ORDER — MORPHINE 4 MG/ML INTRAVENOUS SOLUTION
4 mg/mL | INTRAVENOUS | Status: AC
Start: 2021-10-02 — End: 2021-10-02
  Administered 2021-10-02: 18:00:00 via INTRAVENOUS

## 2021-10-02 MED FILL — HYDROMORPHONE 0.5 MG/0.5 ML SYRINGE: 0.5 mg/ mL | INTRAMUSCULAR | Qty: 1

## 2021-10-02 MED FILL — ONDANSETRON (PF) 4 MG/2 ML INJECTION: 4 mg/2 mL | INTRAMUSCULAR | Qty: 2

## 2021-10-02 MED FILL — ISOVUE-300  61 % INTRAVENOUS SOLUTION: 300 mg iodine /mL (61 %) | INTRAVENOUS | Qty: 100

## 2021-10-02 MED FILL — MORPHINE 4 MG/ML SYRINGE: 4 mg/mL | INTRAMUSCULAR | Qty: 1

## 2021-10-02 MED FILL — OXYCODONE 5 MG TAB: 5 mg | ORAL | Qty: 2

## 2021-10-02 NOTE — ED Notes (Signed)
Nursing supp notified pt will need transportation back to the [facility]

## 2021-10-02 NOTE — ED Notes (Signed)
Pt arrived by EMS for back and leg pain.  Pt complains of lower back pain with pain left leg, pt denies injury or trauma, pt did report she would need to have her left hip replaced.  Nursing facility gave pt her dose of Percocet but it has not helped the pain 10/10, pain is worse on movement.  Pt is awake alert and oriented X 4, pt educated on ER flow

## 2021-10-02 NOTE — ED Notes (Signed)
Received assignment, assuming care.

## 2021-10-02 NOTE — ED Provider Notes (Signed)
ED Provider Notes by Lennox Solders, MD at 10/02/21 1905                Author: Lennox Solders, MD  Service: --  Author Type: Physician       Filed: 10/03/21 0732  Date of Service: 10/02/21 1905  Status: Signed          Editor: Lennox Solders, MD (Physician)               EMERGENCY DEPARTMENT HISTORY AND PHYSICAL EXAM               Date: 10/02/2021   Patient Name: Michele Mejia        History of Presenting Illness          Chief Complaint       Patient presents with        ?  Back Pain           History Provided By: Patient      HPI: COLBY CATANESE is a 80 y.o. female, pmhx listed below, who presents to the ED c/o severe left hip pain.  Reports she was taking her oral  medications at nursing home and pain was worse than usual.  Reports pain is severe with any range of motion.  Already being worked up for a hip replacement due to osteoarthritis.  Patient reports pain radiates around to her back and and up into her abdomen.   No nausea, vomiting, diarrhea.  Denies fever.  Takes oxycodone for pain which she took earlier today and still states pain is 10 out of 10.            PCP: Lynett Grimes, NP      There are no other complaints, changes, or physical findings at this time.               Past History           Past Medical History:     Past Medical History:        Diagnosis  Date         ?  Anemia       ?  Asthma       ?  Change in bowel habits       ?  CKD (chronic kidney disease)       ?  COPD (chronic obstructive pulmonary disease) ([place])       ?  Depression       ?  DJD (degenerative joint disease)       ?  GERD (gastroesophageal reflux disease)       ?  Glaucoma       ?  Gout       ?  HCVD (hypertensive cardiovascular disease)       ?  Obesity       ?  OSA (obstructive sleep apnea)       ?  PAF (paroxysmal atrial fibrillation) ([place])  12/08/2020     ?  Rectal bleed       ?  Ringing in ear           ?  TIA (transient ischemic attack)             Past Surgical History:     Past  Surgical History:         Procedure  Laterality  Date          ?  HX BREAST  BIOPSY  Bilateral            neg          ?  HX BREAST REDUCTION         ?  HX CATARACT REMOVAL         ?  HX CHOLECYSTECTOMY    2016     ?  HX COLONOSCOPY    6.2014     ?  HX ENDOSCOPY    6.2014          chronic gastritis          ?  HX ENDOSCOPY    12/2014          gastritis          ?  HX HEART CATHETERIZATION    09/2009          EF 50%, Normal Coronaries          ?  HX HIP REPLACEMENT  Right            ?  HX HYSTERECTOMY               Family History:     Family History         Problem  Relation  Age of Onset          ?  Hypertension  Mother       ?  Stroke  Mother       ?  Coronary Art Dis  Sister       ?  Hypertension  Sister       ?  Diabetes  Sister       ?  Hypertension  Brother       ?  Diabetes  Brother       ?  Coronary Art Dis  Brother       ?  Cancer  Brother                lung          ?  Hypertension  Brother       ?  Diabetes  Brother       ?  Cancer  Brother                prostate          ?  Diabetes  Sister             Social History:     Social History          Tobacco Use         ?  Smoking status:  Never     ?  Smokeless tobacco:  Never       Substance Use Topics         ?  Alcohol use:  No             Current Facility-Administered Medications             Medication  Dose  Route  Frequency  Provider  Last Rate  Last Admin              ?  oxyCODONE IR (ROXICODONE) tablet 10 mg   10 mg  Oral  Q4H PRN  Lennox Solders, MD     10 mg at 10/02/21 1736          Current Outpatient Medications          Medication  Sig  Dispense  Refill           ?  Xarelto 15 mg tab tablet  TAKE 1 TABLET BY MOUTH EVERY DAY WITH DINNER  90 Tablet  1     ?  acetaminophen (TYLENOL) 325 mg tablet  Take 2 Tablets by mouth every four (4) hours as needed.         ?  albuterol (PROVENTIL HFA, VENTOLIN HFA, PROAIR HFA) 90 mcg/actuation inhaler  Take 2 Puffs by inhalation every four (4) hours as needed for Wheezing, Shortness of Breath or Respiratory  Distress.  18 g  0     ?  bisacodyL (DULCOLAX) 10 mg supp  Insert 10 mg into rectum daily as needed for Constipation.         ?  ferrous sulfate 324 mg (65 mg iron) tablet  Take 1 Tablet by mouth daily (with breakfast).  30 Tablet  0     ?  mometasone-formoterol (DULERA) 200-5 mcg/actuation HFA inhaler  Take 2 Puffs by inhalation two (2) times a day.  13 g  0     ?  magnesium oxide (MAG-OX) 400 mg tablet  Take 1 Tablet by mouth daily.         ?  lidocaine 4 % patch  1 Patch by TransDERmal route every twenty-four (24) hours.  15 Patch  0     ?  cholecalciferol (VITAMIN D3) (2,000 UNITS /50 MCG) cap capsule  Take 2,000 Units by mouth daily.         ?  fluticasone propionate (FLONASE NA)  by Nasal route two (2) times a day.         ?  pantoprazole sodium (PANTOPRAZOLE PO)  Take 40 mg by mouth daily.         ?  azelastine-fluticasone 137-50 mcg/spray spry  by Nasal route two (2) times a day.               ?  gabapentin (NEURONTIN) 100 mg capsule  Take 100 mg by mouth three (3) times daily.               ?  montelukast (SINGULAIR) 10 mg tablet  Take 10 mg by mouth nightly.         ?  tiotropium (SPIRIVA) 18 mcg inhalation capsule  Take 1 Cap by inhalation daily.         ?  dilTIAZem ER (CARDIZEM CD) 300 mg capsule  Take 300 mg by mouth daily.         ?  sertraline (ZOLOFT) 50 mg tablet  Take  by mouth daily.         ?  atenolol (TENORMIN) 50 mg tablet  Take  by mouth daily.         ?  CALCIUM PO  Take  by mouth. 1200 DAILY         ?  OTHER  Vitamin d3 2,000 units daily.         ?  clopidogrel (PLAVIX) 75 mg tablet  Take  by mouth daily.         ?  fluticasone propion-salmeteroL (ADVAIR/WIXELA) 500-50 mcg/dose diskus inhaler  Take 1 Puff by inhalation two (2) times a day. Indications: Currently taking 500-86mg/dose diskus one puff twice a day         ?  ALLOPURINOL PO  Take 100 mg by mouth two (2) times a day.               ?  PRAVASTATIN SODIUM (PRAVASTATIN PO)  Take 40 mg by mouth  daily.               Allergies:      Allergies        Allergen  Reactions         ?  Nuts [Tree Nut]  Swelling     ?  Peanut  Swelling         ?  Simvastatin  Unable to Obtain                Review of Systems     Review of Systems    Constitutional:  Negative for chills and fever.    HENT:  Negative for congestion.     Eyes:  Negative for pain.    Respiratory:  Negative for shortness of breath.     Cardiovascular:  Negative for chest pain.    Gastrointestinal:  Positive for abdominal pain.    Genitourinary:  Negative for flank pain.    Musculoskeletal:  Positive for back pain.    Neurological:  Negative for headaches.    Psychiatric/Behavioral:  Negative for agitation.          Physical Exam        Vital Signs-Reviewed the patient's vital signs.   Patient Vitals for the past 12 hrs:            Temp  Pulse  Resp  BP  SpO2            10/02/21 1821  --  73  16  (!) 153/80  96 %            10/02/21 1650  --  77  16  (!) 148/78  96 %     10/02/21 1510  98.7 ??F (37.1 ??C)  84  18  (!) 158/89  96 %     10/02/21 1335  --  78  18  (!) 178/85  98 %            10/02/21 1246  100.2 ??F (37.9 ??C)  80  18  (!) 209/99  97 %           Physical Exam   Constitutional :        Appearance: Normal appearance.       Comments: Patient lying in fetal position with leg slightly bent.      HENT:       Head: Normocephalic and atraumatic.       Mouth/Throat:       Mouth: Mucous membranes are moist.    Eyes:       Pupils: Pupils are equal, round, and reactive to light.     Cardiovascular:       Rate and Rhythm: Normal rate and regular rhythm.    Pulmonary:       Effort: Pulmonary effort is normal.       Breath sounds: Normal breath sounds.     Abdominal:       Tenderness: There is no abdominal tenderness.       Comments: Diffuse discomfort to exam with no focal tenderness     Musculoskeletal:          General: No swelling.       Comments: Bilateral legs maintained slightly flexed at hips.  Increased pain with any range of motion of hip or any palpation near hip.  Normal distal  pulses.     Skin:      General: Skin is warm and dry.    Neurological:  Mental Status: She is alert and oriented to person, place, and time.       Sensory: No sensory deficit.    Psychiatric:          Mood and Affect: Mood normal.            Diagnostic Study Results        Labs -         Recent Results (from the past 12 hour(s))     CBC WITH AUTOMATED DIFF          Collection Time: 10/02/21  1:31 PM         Result  Value  Ref Range            WBC  7.5  3.6 - 11.0 K/uL       RBC  3.98  3.80 - 5.20 M/uL       HGB  12.0  11.5 - 16.0 g/dL       HCT  37.6  35.0 - 47.0 %       MCV  94.5  80.0 - 99.0 FL       MCH  30.2  26.0 - 34.0 PG       MCHC  31.9  30.0 - 36.5 g/dL       RDW  15.3 (H)  11.5 - 14.5 %       PLATELET  303  150 - 400 K/uL       MPV  9.3  8.9 - 12.9 FL       NRBC  0.0  0 PER 100 WBC       ABSOLUTE NRBC  0.00  0.00 - 0.01 K/uL       NEUTROPHILS  50  32 - 75 %       LYMPHOCYTES  36  12 - 49 %       MONOCYTES  7  5 - 13 %       EOSINOPHILS  6  0 - 7 %       BASOPHILS  1  0 - 1 %       IMMATURE GRANULOCYTES  0  0.0 - 0.5 %       ABS. NEUTROPHILS  3.8  1.8 - 8.0 K/UL       ABS. LYMPHOCYTES  2.7  0.8 - 3.5 K/UL       ABS. MONOCYTES  0.5  0.0 - 1.0 K/UL       ABS. EOSINOPHILS  0.5 (H)  0.0 - 0.4 K/UL       ABS. BASOPHILS  0.1  0.0 - 0.1 K/UL       ABS. IMM. GRANS.  0.0  0.00 - 0.04 K/UL       DF  AUTOMATED          METABOLIC PANEL, COMPREHENSIVE          Collection Time: 10/02/21  1:31 PM         Result  Value  Ref Range            Sodium  141  136 - 145 mmol/L       Potassium  3.0 (L)  3.5 - 5.1 mmol/L       Chloride  101  97 - 108 mmol/L       CO2  33 (H)  21 - 32 mmol/L       Anion gap  7  5 - 15 mmol/L  Glucose  133 (H)  65 - 100 mg/dL       BUN  13  6 - 20 MG/DL       Creatinine  0.94  0.55 - 1.02 MG/DL       BUN/Creatinine ratio  14  12 - 20         eGFR  >60  >60 ml/min/1.62m       Calcium  9.8  8.5 - 10.1 MG/DL       Bilirubin, total  0.4  0.2 - 1.0 MG/DL       ALT (SGPT)  13  12 - 78 U/L        AST (SGOT)  19  15 - 37 U/L       Alk. phosphatase  73  45 - 117 U/L       Protein, total  7.3  6.4 - 8.2 g/dL       Albumin  3.1 (L)  3.5 - 5.0 g/dL       Globulin  4.2 (H)  2.0 - 4.0 g/dL       A-G Ratio  0.7 (L)  1.1 - 2.2         LIPASE          Collection Time: 10/02/21  1:31 PM         Result  Value  Ref Range            Lipase  53 (L)  73 - 393 U/L       MAGNESIUM          Collection Time: 10/02/21  1:31 PM         Result  Value  Ref Range            Magnesium  1.6  1.6 - 2.4 mg/dL           Radiologic Studies -      CT ABD PELV W CONT       Final Result     No acute findings. Extensive chronic and incidental findings as detailed above.                 CT Results  (Last 48 hours)                                       10/02/21 1423    CT ABD PELV W CONT  Final result            Impression:    No acute findings. Extensive chronic and incidental findings as detailed above.                       Narrative:    EXAM: CT ABD PELV W CONT             INDICATION: abd pain             COMPARISON: None              CONTRAST: 100 mL of Isovue 300.             ORAL CONTRAST: None             TECHNIQUE:       Following the uneventful intravenous administration of contrast, thin axial      images were obtained through the abdomen and pelvis. Coronal and sagittal  reconstructions were generated. CT dose reduction was achieved through use of a      standardized protocol tailored for this examination and automatic exposure      control for dose modulation.             FINDINGS:       LOWER THORAX: No significant abnormality in the incidentally imaged lower chest.      LIVER: No mass.      BILIARY TREE: Status post cholecystectomy. CBD is not dilated.      SPLEEN: within normal limits.      PANCREAS: No mass or ductal dilatation.      ADRENALS: Unremarkable.      KIDNEYS: Tiny nonobstructing calculus in the right lower pole. No      hydronephrosis. Large left cyst measuring up to 7.9 cm.      STOMACH: Unremarkable.       SMALL BOWEL: No dilatation or wall thickening.      COLON: No dilatation or wall thickening.      APPENDIX: Unremarkable.      PERITONEUM: No ascites or pneumoperitoneum.      RETROPERITONEUM: No lymphadenopathy or aortic aneurysm.      REPRODUCTIVE ORGANS: Status post hysterectomy.      URINARY BLADDER: No mass or calculus.      BONES: No destructive bone lesion. Extensive postsurgical change in the lumbar      spine. Status post right hip total arthroplasty.      ABDOMINAL WALL: Rectus diastasis with a small fat-containing umbilical hernia..      ADDITIONAL COMMENTS: N/A                                      CXR Results  (Last 48 hours)             None                             Medical Decision Making     I am the first provider for this patient.      I reviewed the vital signs, available nursing notes, past medical history, past surgical history, family history and social history.      Records Reviewed: Nursing Notes and Old Medical Records      Provider Notes (Medical Decision Making):    MDM: 80 year old female with chronic left hip osteoarthritis, now in the emergency department with pain radiating to abdomen and back.  Initial temperature was 100.2.  Also some abdominal tenderness.   Concerning for diverticulitis, alternate intra-abdominal infection, UTI.  May also be worsening of patient's chronic hip pain.  We will plan for work-up including blood work, UA, CT abdomen now.      Initial assessment performed. The patients presenting problems have been discussed, and they are in agreement with the care plan formulated and outlined with them.  I have encouraged them to ask questions  as they arise throughout their visit.      PROGRESS NOTE:   After multiple attempts by nursing to obtain UA, patient is unable to tolerate sitting on a bedpan, getting in a position for straight cath, or getting out of bed for bedpan.  Based on CT results with  no acute infection, no further fever, and normal WBC count, UTI is  unlikely cause of patient's severe left hip pain.  Given several  doses of IV and then oral pain medication with some relief.  Already has follow-up scheduled with orthopedics, patient  encouraged to keep appointment.      Discharge note:   Pt re-evaluated and noted to be feeling better, ready for discharge. Updated pt on all final results.  Will follow up as instructed. All questions have been answered, pt voiced understanding and agreement  with plan. Specific return precautions provided as well as instructions to return to the ED should sx worsen at any time. Vital signs stable for discharge.            Diagnosis        Clinical Impression:       1.  Chronic left hip pain                  Disposition:   Discharged        Current Discharge Medication List                       Please note, this dictation was completed with Dragon, the computer voice recognition software. Quite often unanticipated grammatical, syntax, homophones,  and other interpretive errors are inadvertently transcribed by the computer software. Please disregard these errors. Please excuse any errors that have escaped final proof reading.

## 2021-10-02 NOTE — Progress Notes (Signed)
[facility]S transport arranged via [facility] Corrie Dandy) for discharge to [facility].  Provided demographics and insurance information.  ETA Volney Presser, RN aware.

## 2021-10-02 NOTE — ED Notes (Signed)
Pt states she has wet her brief before arrival, already urinated but can provide a specimen when able. This RN to go attempt to clean and place pt in a new brief when available.

## 2021-10-02 NOTE — ED Notes (Signed)
Pt refusal of dinner tray, states she isn't hungry. Pt does want more medicine for pain, ED Provider informed.

## 2021-10-02 NOTE — ED Notes (Signed)
Charge nurse to [employer] nurse  report given.  Plan: return to [place] with ETA for [facility]200

## 2021-10-02 NOTE — ED Notes (Signed)
[id number] - Pt cleaned with difficulty by this RN, pt barely able to change position in bed without intense pain, unable to spread legs to be cleaned well. Clean brief placed. Barrier cream applied to sacrum with limited effectiveness as pt is unable to roll from side to side without much difficulty.

## 2021-10-02 NOTE — ED Notes (Signed)
SBAR report given to [facility].

## 2021-10-02 NOTE — ED Notes (Signed)
 TRANSFER - OUT REPORT:    Verbal report given to [name] on Michele Mejia  being transferred to [facility](unit) for routine progression of care       Report consisted of patient's Situation, Background, Assessment and   Recommendations(SBAR).     Information from the following report(s) SBAR, ED Summary, Intake/Output, MAR, Accordion, Recent Results, Med Rec Status, Cardiac Rhythm NSR and Quality Measures was reviewed with the receiving nurse.    Lines:       Opportunity for questions and clarification was provided.      Patient transported with:  [facility]

## 2021-10-02 NOTE — ED Notes (Signed)
[facility] called and ETA was changed to 2200

## 2021-10-03 MED FILL — OXYCODONE 5 MG TAB: 5 mg | ORAL | Qty: 2

## 2021-11-01 ENCOUNTER — Encounter

## 2021-11-01 ENCOUNTER — Inpatient Hospital Stay: Admit: 2021-11-01 | Payer: MEDICARE | Primary: Family

## 2021-11-01 DIAGNOSIS — Z01818 Encounter for other preprocedural examination: Secondary | ICD-10-CM

## 2021-11-01 DIAGNOSIS — M1612 Unilateral primary osteoarthritis, left hip: Secondary | ICD-10-CM

## 2021-11-01 LAB — COMPREHENSIVE METABOLIC PANEL
ALT: 11 U/L — ABNORMAL LOW (ref 13–56)
AST: 11 U/L (ref 10–38)
Albumin/Globulin Ratio: 0.8 (ref 0.8–1.7)
Albumin: 2.8 g/dL — ABNORMAL LOW (ref 3.4–5.0)
Alkaline Phosphatase: 66 U/L (ref 45–117)
Anion Gap: 5 mmol/L (ref 3.0–18)
BUN: 15 MG/DL (ref 7.0–18)
Bun/Cre Ratio: 19 (ref 12–20)
CO2: 31 mmol/L (ref 21–32)
Calcium: 9.6 MG/DL (ref 8.5–10.1)
Chloride: 104 mmol/L (ref 100–111)
Creatinine: 0.79 MG/DL (ref 0.6–1.3)
ESTIMATED GLOMERULAR FILTRATION RATE: >60 mL/min/{1.73_m2} (ref >60)
Globulin: 3.6 g/dL (ref 2.0–4.0)
Glucose: 132 mg/dL — ABNORMAL HIGH (ref 74–99)
Potassium: 3.2 mmol/L — ABNORMAL LOW (ref 3.5–5.5)
Sodium: 140 mmol/L (ref 136–145)
Total Bilirubin: 0.3 MG/DL (ref 0.2–1.0)
Total Protein: 6.4 g/dL (ref 6.4–8.2)

## 2021-11-01 LAB — CBC WITH AUTO DIFFERENTIAL
Basophils %: 1 % (ref 0–2)
Basophils Absolute: 0.1 10*3/uL (ref 0.0–0.1)
Eosinophils %: 7 % — ABNORMAL HIGH (ref 0–5)
Eosinophils Absolute: 0.5 10*3/uL — ABNORMAL HIGH (ref 0.0–0.4)
Granulocyte Absolute Count: 0 10*3/uL (ref 0.00–0.04)
Hematocrit: 35.4 % (ref 35.0–45.0)
Hemoglobin: 11.4 g/dL — ABNORMAL LOW (ref 12.0–16.0)
Immature Granulocytes: 0 % (ref 0.0–0.5)
Lymphocytes %: 30 % (ref 21–52)
Lymphocytes Absolute: 2.1 10*3/uL (ref 0.9–3.6)
MCH: 30.6 PG (ref 24.0–34.0)
MCHC: 32.2 g/dL (ref 31.0–37.0)
MCV: 95.2 FL (ref 78.0–100.0)
MPV: 9.4 FL (ref 9.2–11.8)
Monocytes %: 6 % (ref 3–10)
Monocytes Absolute: 0.4 10*3/uL (ref 0.05–1.2)
NRBC Absolute: 0 10*3/uL (ref 0.00–0.01)
Neutrophils %: 56 % (ref 40–73)
Neutrophils Absolute: 4 10*3/uL (ref 1.8–8.0)
Nucleated RBCs: 0 PER 100 WBC
Platelets: 257 10*3/uL (ref 135–420)
RBC: 3.72 M/uL — ABNORMAL LOW (ref 4.20–5.30)
RDW: 15 % — ABNORMAL HIGH (ref 11.6–14.5)
WBC: 7.1 10*3/uL (ref 4.6–13.2)

## 2021-11-01 LAB — HEMOGLOBIN A1C W/EAG
Hemoglobin A1C: 6.3 % — ABNORMAL HIGH (ref 4.2–5.6)
eAG: 134 mg/dL

## 2021-11-01 LAB — SEDIMENTATION RATE: Sed Rate: 48 mm/hr — ABNORMAL HIGH (ref 0–30)

## 2021-11-01 LAB — PROTIME-INR
INR: 1.7 — ABNORMAL HIGH (ref 0.8–1.2)
Protime: 20 s — ABNORMAL HIGH (ref 11.5–15.2)

## 2021-11-01 LAB — APTT: aPTT: 30.2 s (ref 23.0–36.4)

## 2021-11-01 LAB — METABOLIC PANEL, COMPREHENSIVE
A-G Ratio: 0.8 (ref 0.8–1.7)
ALT (SGPT): 11 U/L — ABNORMAL LOW (ref 13–56)
AST (SGOT): 11 U/L (ref 10–38)
Albumin: 2.8 g/dL — ABNORMAL LOW (ref 3.4–5.0)
Alk. phosphatase: 66 U/L (ref 45–117)
Anion gap: 5 mmol/L (ref 3.0–18)
BUN/Creatinine ratio: 19 (ref 12–20)
BUN: 15 MG/DL (ref 7.0–18)
Bilirubin, total: 0.3 MG/DL (ref 0.2–1.0)
CO2: 31 mmol/L (ref 21–32)
Calcium: 9.6 MG/DL (ref 8.5–10.1)
Chloride: 104 mmol/L (ref 100–111)
Creatinine: 0.79 MG/DL (ref 0.6–1.3)
Globulin: 3.6 g/dL (ref 2.0–4.0)
Glucose: 132 mg/dL — ABNORMAL HIGH (ref 74–99)
Potassium: 3.2 mmol/L — ABNORMAL LOW (ref 3.5–5.5)
Protein, total: 6.4 g/dL (ref 6.4–8.2)
Sodium: 140 mmol/L (ref 136–145)
eGFR: >60 mL/min/{1.73_m2} (ref >60)

## 2021-11-01 LAB — CBC WITH AUTOMATED DIFF
ABS. BASOPHILS: 0.1 10*3/uL (ref 0.0–0.1)
ABS. EOSINOPHILS: 0.5 10*3/uL — ABNORMAL HIGH (ref 0.0–0.4)
ABS. IMM. GRANS.: 0 10*3/uL (ref 0.00–0.04)
ABS. LYMPHOCYTES: 2.1 10*3/uL (ref 0.9–3.6)
ABS. MONOCYTES: 0.4 10*3/uL (ref 0.05–1.2)
ABS. NEUTROPHILS: 4 10*3/uL (ref 1.8–8.0)
ABSOLUTE NRBC: 0 10*3/uL (ref 0.00–0.01)
BASOPHILS: 1 % (ref 0–2)
EOSINOPHILS: 7 % — ABNORMAL HIGH (ref 0–5)
HCT: 35.4 % (ref 35.0–45.0)
HGB: 11.4 g/dL — ABNORMAL LOW (ref 12.0–16.0)
IMMATURE GRANULOCYTES: 0 % (ref 0.0–0.5)
LYMPHOCYTES: 30 % (ref 21–52)
MCH: 30.6 PG (ref 24.0–34.0)
MCHC: 32.2 g/dL (ref 31.0–37.0)
MCV: 95.2 FL (ref 78.0–100.0)
MONOCYTES: 6 % (ref 3–10)
MPV: 9.4 FL (ref 9.2–11.8)
NEUTROPHILS: 56 % (ref 40–73)
NRBC: 0 PER 100 WBC
PLATELET: 257 10*3/uL (ref 135–420)
RBC: 3.72 M/uL — ABNORMAL LOW (ref 4.20–5.30)
RDW: 15 % — ABNORMAL HIGH (ref 11.6–14.5)
WBC: 7.1 10*3/uL (ref 4.6–13.2)

## 2021-11-01 LAB — PROTHROMBIN TIME + INR
INR: 1.7 — ABNORMAL HIGH (ref 0.8–1.2)
Prothrombin time: 20 s — ABNORMAL HIGH (ref 11.5–15.2)

## 2021-11-01 LAB — HEMOGLOBIN A1C WITH EAG
Est. average glucose: 134 mg/dL
Hemoglobin A1c: 6.3 % — ABNORMAL HIGH (ref 4.2–5.6)

## 2021-11-01 LAB — PTT: aPTT: 30.2 s (ref 23.0–36.4)

## 2021-11-01 LAB — SED RATE (ESR): Sed rate, automated: 48 mm/hr — ABNORMAL HIGH (ref 0–30)

## 2021-11-02 LAB — CULTURE, MRSA

## 2021-11-02 LAB — EKG 12-LEAD
Q-T Interval: 392 ms
QRS Duration: 88 ms
QTc Calculation (Bazett): 463 ms
R Axis: -39 degrees
T Axis: 162 degrees
Ventricular Rate: 84 {beats}/min

## 2021-11-02 LAB — EKG, 12 LEAD, INITIAL
Calculated R Axis: -39 degrees
Calculated T Axis: 162 degrees
Q-T Interval: 392 ms
QRS Duration: 88 ms
QTC Calculation (Bezet): 463 ms
Ventricular Rate: 84 {beats}/min

## 2021-11-16 ENCOUNTER — Ambulatory Visit: Payer: MEDICAID | Attending: Acute Care | Primary: Family

## 2021-11-16 DIAGNOSIS — I480 Paroxysmal atrial fibrillation: Secondary | ICD-10-CM

## 2021-11-20 ENCOUNTER — Inpatient Hospital Stay: Payer: MEDICARE | Primary: Family

## 2021-11-20 NOTE — Interval H&P Note (Signed)
Kristie from Dr. Celene Skeen office has clearance for EKG. Will fax ASAP.

## 2021-11-20 NOTE — Interval H&P Note (Signed)
Pre op instructions faxed to [facility]

## 2021-11-20 NOTE — Interval H&P Note (Signed)
Anesthesia comments and EKG faxed to Dr. Celene Skeen office. LVM for Kristie.

## 2021-11-20 NOTE — Interval H&P Note (Signed)
Messages left 12-6 and 12-9 on clearance.  Fax request sent 12-8.  At this time left another message for Danford Bad at [facility] office for clearance for abnormal EKG.

## 2021-11-20 NOTE — Interval H&P Note (Signed)
LM for Danford Bad with details re if PCP has reviewed the EKG changes on Ms Tiggles pre op EKG

## 2021-11-20 NOTE — Interval H&P Note (Signed)
LM for the DON Latissa Mews with callback # to PAT. Requesting a little more info to complete hx of who all the specialists are and lm to send the patient's cpap on dos.

## 2021-11-20 NOTE — Interval H&P Note (Signed)
Spoke with [facility] Michele Mejia nurse at the [facility]. Reviewed medications. Reviewed instructions for pre op. She thinks the pt. Will need a Marketing executive on dos. Will notify holding. Will fax instructions to the  [facility] regarding pre op soap/chlorhexidine.     1510 spoke with minister/ POA Michele Mejia. Does not know who the patient's specialists are. States Catering manager of nurses will be the one to talk to. Attempted to reach the DON, she is off today. Will reattempt tomorrow

## 2021-11-20 NOTE — Interval H&P Note (Signed)
Per PCP clearance in media, patient is cleared pending cardiology eval. Spoke to [facility], at Dr. Celene Skeen office, she will follow-up if patient had cardiology eval and forward documentation if she did.

## 2021-11-23 ENCOUNTER — Encounter: Attending: Acute Care | Primary: Family

## 2021-11-23 DIAGNOSIS — I480 Paroxysmal atrial fibrillation: Secondary | ICD-10-CM

## 2021-11-23 NOTE — H&P (Signed)
H&P by Cathlean Sauer, PA at 11/23/21 1449                Author: Cathlean Sauer, PA  Service: Orthopedic Surgery  Author Type: Physician Assistant       Filed: 11/23/21 1449  Date of Service: 11/23/21 1449  Status: Signed           Editor: Cathlean Sauer, PA (Physician Assistant)  Cosigner: Derryl Harbor, MD at 11/27/21 [facility]                       [facility]   History and Physical Exam          Patient: Michele Mejia  MRN: [id number]   SSN: [id number]          Date of Birth: 02/11/1941   Age: 80 y.o.   Sex: female           Subjective:         Chief Complaint: Left  hip pain      History of Present Illness:  Patient complains of left hip pain and difficulty ambulating, which has progressed over the past several months.  X-rays showed osteoarthritis of the joint.  The patient's  pain has persisted and progressed despite conservative treatments and therapies.   The patient has been previously treated with nsaids.  The patient has at this time opted for surgical intervention.            Past Medical History:        Diagnosis  Date         ?  Anemia       ?  Asthma       ?  Change in bowel habits       ?  CKD (chronic kidney disease)       ?  COPD (chronic obstructive pulmonary disease) (HCC)       ?  Depression       ?  DJD (degenerative joint disease)       ?  GERD (gastroesophageal reflux disease)       ?  Glaucoma       ?  Gout       ?  HCVD (hypertensive cardiovascular disease)       ?  Obesity       ?  OSA (obstructive sleep apnea)       ?  PAF (paroxysmal atrial fibrillation) (HCC)  12/08/2020     ?  Primary localized osteoarthritis of left hip  11/23/2021     ?  Rectal bleed       ?  Ringing in ear           ?  TIA (transient ischemic attack)            Past Surgical History:         Procedure  Laterality  Date          ?  HX BREAST BIOPSY  Bilateral            neg          ?  HX BREAST REDUCTION         ?  HX CATARACT REMOVAL         ?  HX CHOLECYSTECTOMY     2016     ?  HX COLONOSCOPY    6.2014     ?  HX ENDOSCOPY    6.2014          chronic gastritis          ?  HX ENDOSCOPY    12/2014          gastritis          ?  HX HEART CATHETERIZATION    09/2009          EF 50%, Normal Coronaries          ?  HX HIP REPLACEMENT  Right            ?  HX HYSTERECTOMY              Social History          Occupational History        ?  Not on file       Tobacco Use         ?  Smoking status:  Never     ?  Smokeless tobacco:  Never       Substance and Sexual Activity         ?  Alcohol use:  No     ?  Drug use:  Not Currently         ?  Sexual activity:  Not on file          Prior to Admission medications             Medication  Sig  Start Date  End Date  Taking?  Authorizing Provider            patiromer calcium sorbitex (Veltassa) 8.4 gram powder  Take  by mouth every other day.        Provider, Historical     LORazepam (Ativan) 0.5 mg tablet  Take  by mouth.        Provider, Historical     fluticasone furoate-vilanteroL (Breo Ellipta) 200-25 mcg/dose inhaler  Take 1 Puff by inhalation daily.        Provider, Historical     DULoxetine (Cymbalta) 60 mg capsule  Take 60 mg by mouth daily.        Provider, Historical     furosemide (Lasix) 20 mg tablet  Take 20 mg by mouth daily. Indications: high blood pressure        Provider, Historical     oxyCODONE-acetaminophen (Percocet) 10-325 mg per tablet  Take  by mouth.        Provider, Historical     tiotropium (Spiriva with HandiHaler) 18 mcg inhalation capsule  Take 1 Capsule by inhalation daily.        Provider, Historical     pantoprazole (Protonix) 40 mg tablet  Take 40 mg by mouth daily.        Provider, Historical     metoprolol succinate (Toprol XL) 25 mg XL tablet  Take 25 mg by mouth daily. Indications: a-fib        Provider, Historical     Xarelto 15 mg tab tablet  TAKE 1 TABLET BY MOUTH EVERY DAY WITH DINNER  08/16/21      Ernie Hew., NP     acetaminophen (TYLENOL) 325 mg tablet  Take 2 Tablets by mouth every four (4) hours as  needed.  08/03/21      Lona Kettle, MD     albuterol (PROVENTIL HFA, VENTOLIN HFA, PROAIR HFA) 90 mcg/actuation inhaler  Take 2 Puffs by inhalation every four (4) hours as  needed for Wheezing, Shortness of Breath or Respiratory Distress.  08/03/21      Lona Kettle, MD     bisacodyL (DULCOLAX) 10 mg supp  Insert 10 mg into rectum daily as needed for Constipation.  08/03/21      Lona Kettle, MD            ferrous sulfate 324 mg (65 mg iron) tablet  Take 1 Tablet by mouth daily (with breakfast).  08/04/21      Lona Kettle, MD            magnesium oxide (MAG-OX) 400 mg tablet  Take 1 Tablet by mouth daily.  08/03/21      Lona Kettle, MD     lidocaine 4 % patch  1 Patch by TransDERmal route every twenty-four (24) hours.  08/03/21      Lona Kettle, MD     cholecalciferol (VITAMIN D3) (2,000 UNITS /50 MCG) cap capsule  Take 2,000 Units by mouth daily.  03/13/21      Provider, Historical     fluticasone propionate (FLONASE NA)  by Nasal route two (2) times a day.        Provider, Historical     azelastine-fluticasone 137-50 mcg/spray spry  by Nasal route two (2) times a day.        Provider, Historical     gabapentin (NEURONTIN) 100 mg capsule  Take 100 mg by mouth three (3) times daily.        Provider, Historical     montelukast (SINGULAIR) 10 mg tablet  Take 10 mg by mouth nightly.        Provider, Historical     dilTIAZem ER (CARDIZEM LA) 240 mg tablet  Take 300 mg by mouth daily.        Provider, Historical     CALCIUM PO  Take  by mouth. 1200 DAILY        Provider, Historical     OTHER  Vitamin d3 2,000 units daily.        Provider, Historical     fluticasone propion-salmeteroL (ADVAIR/WIXELA) 500-50 mcg/dose diskus inhaler  Take 1 Puff by inhalation two (2) times a day. Indications: Currently taking 500-64mcg/dose diskus one puff twice a day        Provider, Historical     ALLOPURINOL PO  Take 100 mg by mouth two (2) times a day.        Provider, Historical            PRAVASTATIN SODIUM (PRAVASTATIN PO)  Take 40  mg by mouth daily.        Provider, Historical           Allergies:      Allergies        Allergen  Reactions         ?  Nuts [Tree Nut]  Swelling     ?  Peanut  Swelling         ?  Simvastatin  Unable to Obtain            Review of Systems:   A comprehensive review of systems was negative except for that written in the History of Present Illness.        Objective:          Physical Exam:   HEENT: Normocephalic, atraumatic   Lungs:  Clear to auscultation   Heart:   Regular rate and rhythm   Abdomen: Soft  Extremities:  Pain with range of motion of the left hip.  Passive flexion 90-100 degrees,                        passive internal rotation 0-10 degrees, with pain throughout ROM,                         passive external rotation 10-20 degrees with pain at the arc of motion.                        Antalgic gait noted.        Assessment:         Arthritis of the left hip.        Plan:          Proceed with scheduled LEFT TOTAL HIP ARTHROPLASTY.      The various methods of treatment have been discussed with the patient and family. After consideration of risks, benefits, and other options for treatment, the patient has consented to surgical interventions. Questions were answered and preoperative teaching  was done by Dr Rosary Lively.          Signed By:  Cathlean Sauer, PA           November 23, 2021

## 2021-11-24 NOTE — Interval H&P Note (Signed)
Holding aware of need for [facility] lift for [employer]

## 2021-11-27 ENCOUNTER — Inpatient Hospital Stay: Payer: MEDICARE

## 2021-11-27 NOTE — Care Coordination-Inpatient (Signed)
Spoke with Dr. Ilsa Iha, anesthesia, Need cardiology clearance. After talking with Danford Bad, the medical doctor cleared her pending cardiology evaluation for abnormal EKG and anticoag management.  She has not had a cardiac eval at this time.  Case cancelled for today pending cardiac clearance. OR front desk, Pre-op, intra-op and Dr. Ilsa Iha notified.

## 2021-11-27 NOTE — Care Coordination-Inpatient (Signed)
Spoke with POA , Larose Kells concerning cancellation due to fact that medical doctor had pended her clearance on her being evaluated by cardiologist.  This was not done. He informed me that patient says she was dressed and prepared 2 times last week to see the cardiologist but transportation did not arrive hence it was dropped. He understands situation and will have patient transported back to nursing facility.  Understands they will need to obtain cardiac clearance and follow -up for rescheduling with Dr. Montez Morita.

## 2021-11-27 NOTE — Progress Notes (Signed)
Case cancelled due to lack of cardiac clearance. Will try to reschedule once clearances obtained.

## 2021-12-19 ENCOUNTER — Ambulatory Visit: Admit: 2021-12-19 | Discharge: 2021-12-19 | Payer: MEDICARE | Attending: Acute Care | Primary: Family

## 2021-12-19 DIAGNOSIS — Z01818 Encounter for other preprocedural examination: Secondary | ICD-10-CM

## 2021-12-19 NOTE — Progress Notes (Signed)
Progress Notes by Ernie Hew., NP at 12/19/21 1140                Author: Ernie Hew., NP  Service: --  Author Type: Nurse Practitioner       Filed: 12/19/21 1426  Encounter Date: 12/19/2021  Status: Signed          Editor: Ernie Hew., NP (Nurse Practitioner)                                  Michele Mejia is a  81 y.o. female is here for routine f/u.  Hx hypertension, DM II, CKD III, obesity, COPD/asthma,  GERD, OSA followed by PCP, Nephrology and recent OV with Pulmonary.  Mild stable DOE, intermittent chest tightness.  Lexiscan MPI 06/09/19  normal perfusion, LVEF 59%, no ischemia.  Echo 02/18/19 with LVEF 55-60, borderline LVH, grade I diastolic, valves ok, normal RVSP.  C/o L hip and back pain.    NEW ONSET AFIB noted on EKG 05/05/20(no sx), rate controlled--?paroxysmal or persistent.  CHADs2-VASC 7 !!--Xarelto 15mg  started, ASA stopped (still on Plavix).      Holter monitor 6/21 with NSR, PAF 30% of time.      Echo 06/13/20:   ??  LV: Estimated LVEF is 55 - 60%. Visually measured ejection fraction. Normal cavity size, systolic function (ejection fraction normal) and diastolic  function. Mild concentric hypertrophy. Age-appropriate left ventricular diastolic function.   ??  AV: With no evidence of reduced excursion.   ??  TV: Pulmonary hypertension not suggested by Doppler findings.       Recall OV   5/21 with new onset afib, rate controlled. (no sx).      Last seen by Dr 6/21 11/2020:   and pre-op clearance for planned hip sgy.      Seen by me in 05/2021   States hip surgery was cancelled d/t low Hgb/Iron but normalized now.  She will see surgeon soon and hopefully   Arrange for surgery.  Left hip pain, not able to move much because of it.        Today      She was scheduled for LEFT total hip replacement on 11/27/2021 to be performed by Dr 14/11/2021 but got canceled again.   No specific CV complaints today.  Main complaint is left hip pain.         The patient denies chest pain/ shortness of  breath, orthopnea, PND,  palpitations, syncope, presyncope or fatigue.           Patient Active Problem List           Diagnosis  Date Noted         ?  Primary localized osteoarthritis of left hip  11/23/2021     ?  Fall from ground level  07/28/2021     ?  Encounter for rehabilitation  07/28/2021     ?  Hip osteoarthritis  07/25/2021     ?  COVID-19  07/24/2021     ?  Hypomagnesemia  07/24/2021     ?  Hip pain  07/23/2021     ?  PAF (paroxysmal atrial fibrillation) (HCC)  12/08/2020     ?  Severe obesity (HCC)  02/10/2019     ?  CKD (chronic kidney disease)       ?  Abdominal pain  12/06/2014     ?  Weight loss  12/06/2014     ?  Ventral hernia  12/06/2014     ?  GERD (gastroesophageal reflux disease)  08/18/2014     ?  Obesity  08/18/2014     ?  Dyslipidemia  08/18/2014     ?  Anemia  08/18/2014     ?  TIA (transient ischemic attack)  08/18/2014     ?  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  HCVD (hypertensive cardiovascular disease)       ?  OSA (obstructive sleep apnea)       ?  COPD (chronic obstructive pulmonary disease) (HCC)           ?  T2DM (type 2 diabetes mellitus) (HCC)  03/13/2013         Burno, Chase Caller, NP     Past Medical History:        Diagnosis  Date         ?  Anemia       ?  Asthma       ?  Change in bowel habits       ?  CKD (chronic kidney disease)       ?  COPD (chronic obstructive pulmonary disease) (HCC)       ?  Depression       ?  DJD (degenerative joint disease)       ?  GERD (gastroesophageal reflux disease)       ?  Glaucoma       ?  Gout       ?  HCVD (hypertensive cardiovascular disease)       ?  Obesity       ?  OSA (obstructive sleep apnea)       ?  PAF (paroxysmal atrial fibrillation) (HCC)  12/08/2020     ?  Primary localized osteoarthritis of left hip  11/23/2021     ?  Rectal bleed       ?  Ringing in ear           ?  TIA (transient ischemic attack)             Past Surgical History:         Procedure  Laterality  Date          ?  HX BREAST BIOPSY   Bilateral            neg          ?  HX BREAST REDUCTION         ?  HX CATARACT REMOVAL         ?  HX CHOLECYSTECTOMY    2016     ?  HX COLONOSCOPY    6.2014     ?  HX ENDOSCOPY    6.2014          chronic gastritis          ?  HX ENDOSCOPY    12/2014          gastritis          ?  HX HEART CATHETERIZATION    09/2009          EF 50%, Normal Coronaries          ?  HX HIP REPLACEMENT  Right            ?  HX HYSTERECTOMY  Allergies        Allergen  Reactions         ?  Nuts [Tree Nut]  Swelling     ?  Peanut  Swelling         ?  Simvastatin  Unable to Obtain           Family History         Problem  Relation  Age of Onset          ?  Hypertension  Mother       ?  Stroke  Mother       ?  Coronary Art Dis  Sister       ?  Hypertension  Sister       ?  Diabetes  Sister       ?  Hypertension  Brother       ?  Diabetes  Brother       ?  Coronary Art Dis  Brother       ?  Cancer  Brother                lung          ?  Hypertension  Brother       ?  Diabetes  Brother       ?  Cancer  Brother                prostate          ?  Diabetes  Sister             Social History          Socioeconomic History         ?  Marital status:  WIDOWED              Spouse name:  Not on file         ?  Number of children:  Not on file     ?  Years of education:  Not on file     ?  Highest education level:  Not on file       Occupational History        ?  Not on file       Tobacco Use         ?  Smoking status:  Never     ?  Smokeless tobacco:  Never       Substance and Sexual Activity         ?  Alcohol use:  No     ?  Drug use:  Not Currently     ?  Sexual activity:  Not on file        Other Topics  Concern        ?  Not on file       Social History Narrative        ?  Not on file          Social Determinants of Health          Financial Resource Strain: Not on file     Food Insecurity: Not on file     Transportation Needs: Not on file     Physical Activity: Not on file     Stress: Not on file     Social Connections: Not on file      Intimate Partner Violence: Not on file       Housing Stability:  Not on file           Current Outpatient Medications        Medication  Sig         ?  ascorbic acid (VITAMIN C PO)  Take  by mouth.     ?  multivitamin (ONE A DAY) tablet  Take 1 Tablet by mouth daily.     ?  clopidogreL (Plavix) 75 mg tab  Take 75 mg by mouth daily.     ?  lactulose (CEPHULAC) 20 gram packet  Take  by mouth. 30 ml every day.     ?  Senna 8.6 mg tablet  Take 1 Tablet by mouth two (2) times a day.     ?  patiromer calcium sorbitex (Veltassa) 8.4 gram powder  Take 8.4 g by mouth daily.     ?  fluticasone furoate-vilanteroL (BREO ELLIPTA) 200-25 mcg/dose inhaler  Take 1 Puff by inhalation daily.     ?  DULoxetine (CYMBALTA) 60 mg capsule  Take 60 mg by mouth daily.     ?  furosemide (LASIX) 20 mg tablet  Take 20 mg by mouth daily. Indications: high blood pressure     ?  oxyCODONE-acetaminophen (PERCOCET 10) 10-325 mg per tablet  Take 1 Tablet by mouth every six (6) hours as needed.     ?  tiotropium (SPIRIVA) 18 mcg inhalation capsule  Take 1 Capsule by inhalation daily.     ?  pantoprazole (PROTONIX) 40 mg tablet  Take 40 mg by mouth daily.     ?  metoprolol succinate (TOPROL-XL) 25 mg XL tablet  Take 25 mg by mouth daily. Indications: a-fib     ?  Xarelto 15 mg tab tablet  TAKE 1 TABLET BY MOUTH EVERY DAY WITH DINNER     ?  ferrous sulfate 324 mg (65 mg iron) tablet  Take 324 mg by mouth two (2) times daily (with meals).     ?  magnesium oxide (MAG-OX) 400 mg tablet  Take 1 Tablet by mouth daily.     ?  lidocaine 4 % patch  1 Patch by TransDERmal route every twenty-four (24) hours.     ?  cholecalciferol (VITAMIN D3) (2,000 UNITS /50 MCG) cap capsule  Take 5,000 Units by mouth daily.     ?  azelastine-fluticasone 137-50 mcg/spray spry  by Nasal route two (2) times a day.     ?  montelukast (SINGULAIR) 10 mg tablet  Take 10 mg by mouth nightly.     ?  dilTIAZem ER (CARDIZEM LA) 240 mg tablet  Take 240 mg by mouth daily.     ?   ALLOPURINOL PO  Take 100 mg by mouth daily.         ?  PRAVASTATIN SODIUM (PRAVASTATIN PO)  Take 40 mg by mouth daily.          No current facility-administered medications for this visit.             Review of Symptoms:         CONST   No weight change. No fever, chills, sweats      ENT  No visual changes, URI sx, sore throat      CV   See HPI     RESP   No cough, or sputum, wheezing. Also see HPI     GI   No abdominal pain or change in bowel habits.   No heartburn or dysphagia.  No melena or rectal bleeding.      GU   No dysuria, urgency, frequency, hematuria     MSKEL   No joint pain, swelling.    No muscle pain.      SKIN   No rash or lesions.      NEURO   No headache, syncope, or seizure.    No weakness, loss of sensation, or paresthesias.      PSYCH   No low mood or depression   No anxiety.         HE/LYMPH   No easy bruising, abnormal bleeding, or enlarged glands.            Physical ExamPhysical Exam:     Visit Vitals      BP  99/60 (BP 1 Location: Left upper arm, BP Patient Position: Sitting, BP Cuff Size: Large adult)     Pulse  80     Temp  98.2 ??F (36.8 ??C) (Temporal)     Resp  18     Ht  5\' 7"  (1.702 m)         SpO2  99%  Comment: ra        BMI  36.02 kg/m??        Gen: NAD   HEENT:  PERRL, throat clear   Neck: no adenopathy, no thyromegaly, no JVD    Heart:  irregular,Nl S1S2,  no murmur, gallop or rub.    Lungs:  clear   Abdomen:   Soft, non-tender, bowel sounds are active.    Extremities: Dependent leg swelling L>R   Pulse: symmetric   Neuro: A&O times 3, No focal neuro deficits      Cardiographics      ECG: Rate controlled AF           Assessment:               Patient Active Problem List           Diagnosis  Date Noted         ?  Primary localized osteoarthritis of left hip  11/23/2021     ?  Fall from ground level  07/28/2021     ?  Encounter for rehabilitation  07/28/2021     ?  Hip osteoarthritis  07/25/2021     ?  COVID-19  07/24/2021     ?  Hypomagnesemia  07/24/2021     ?  Hip pain   07/23/2021     ?  PAF (paroxysmal atrial fibrillation) (HCC)  12/08/2020     ?  Severe obesity (HCC)  02/10/2019     ?  CKD (chronic kidney disease)       ?  Abdominal pain  12/06/2014     ?  Weight loss  12/06/2014     ?  Ventral hernia  12/06/2014     ?  GERD (gastroesophageal reflux disease)  08/18/2014     ?  Obesity  08/18/2014     ?  Dyslipidemia  08/18/2014     ?  Anemia  08/18/2014     ?  TIA (transient ischemic attack)  08/18/2014     ?  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  HCVD (hypertensive cardiovascular disease)       ?  OSA (obstructive sleep apnea)       ?  COPD (chronic obstructive pulmonary disease) (HCC)           ?  T2DM (type 2 diabetes mellitus) (HCC)  03/13/2013           Hx hypertension, DM II, CKD III, obesity, COPD/asthma, GERD, OSA followed by PCP, Nephrology and recent OV with Pulmonary.  Mild stable DOE, intermittent chest tightness.  Lexiscan MP I 06/09/19 normal perfusion, LVEF 59%, no ischemia.  Echo 02/18/19 with LVEF 55-60, borderline LVH, grade I diastolic, valves ok, normal RVSP.  C/o L hip and back pain.    NEW ONSET AFIB noted on EKG 05/05/20(no sx), rate controlled--?paroxysmal or persistent.  CHADs2-VASC 7 !!--Xarelto 15mg  started, ASA stopped (still on Plavix).      Holter monitor 6/21 with NSR, PAF 30% of time.      Echo 06/13/20:   ??  LV: Estimated LVEF is 55 - 60%. Visually measured ejection fraction. Normal cavity size, systolic function (ejection fraction normal) and diastolic  function. Mild concentric hypertrophy. Age-appropriate left ventricular diastolic function.   ??  AV: With no evidence of reduced excursion.   ??  TV: Pulmonary hypertension not suggested by Doppler findings.         Plan:        PAF   Normal EF valves ok per echo 05/2020   Holter monitor 6/21 with NSR, PAF 30% of time.   Continue Diltiazem 300 mg daily   Continue Atenolol   Continue Xarelto 15 mg daily for CVA risk reduction   CHADs2-VASC 7 !!--Xarelto 15mg  started, ASA stopped  (still on Plavix)   Hgb 11.4; Serum Cr 0.79 in 10/2021      Lexiscan MPI 06/09/19 normal perfusion, LVEF 59%, no ischemia.      HTN   Controlled with current therapy      HLD   05/2021 LDL 79 On prava Labs and lipids per PCP      OSA  On PAP therapy      COPD   On inhalers      Hx TIA   On plavix, statin         Preop clearance for LEFT total hip replacement be performed by Dr Montez Moritaarter   Normal LVEF in 05/2020.  Negative NST in 05/2019.     Although in atrial fibrillation, the benefits of performing the procedure exceeds the relative risks of holding Xarelto   CLEAR for planned hip surgery at low cardiac risk--ok to hold Xarelto 24-48 hrs prior, restart after. Can hold Plavix 6-7 days prior          Continue current care and f/u in 6 months with Dr Tobey BrideWittkamp      Ernie HewNikki R. Doral Digangi, NP

## 2021-12-19 NOTE — Progress Notes (Signed)
Identified pt with two pt identifiers(name and DOB). Reviewed record in preparation for visit and have obtained necessary documentation.  Chief Complaint   Patient presents with    Surgical Clearance     Left Hip Replacement surgery w/ Dr. Montez Morita      Vitals:    12/19/21 1325 12/19/21 1348   BP: 90/80 99/60   Pulse: 80    Resp: 18    Temp: 98.2 F (36.8 C)    TempSrc: Temporal    SpO2: 99%    Height: 5\' 7"  (1.702 m)    PainSc:   6    PainLoc: Hip        Medications reviewed/approved by provider.      Health Maintenance Review: Patient reminded of "due or due soon" health maintenance. I have asked the patient to contact his/her primary care provider (PCP) for follow-up on his/her health maintenance.    Coordination of Care Questionnaire:  :   1) Have you been to an emergency room, urgent care, or hospitalized since your last visit?  If yes, where when, and reason for visit? Yes - [facility] 07/2021 fall, 10/17/222 back pain        2. Have seen or consulted any other health care provider since your last visit?   If yes, where when, and reason for visit?  YES - ortho surgeon for hip pain ([facility])      Patient is accompanied by self I have received verbal consent from Elby Showers to discuss any/all medical information while they are present in the room.

## 2022-03-19 IMAGING — CR KNEE LT 4 VWS MIN
1 series · 4 of 4 positions shown · non-contrast
Comparison: None available.

INDICATION: Pain in left knee.
TECHNIQUE: 3 views of the left knee are obtained.

[Series 1: ap · 0.17mm/px · 4 of 4 slices shown]
[im 1/4]
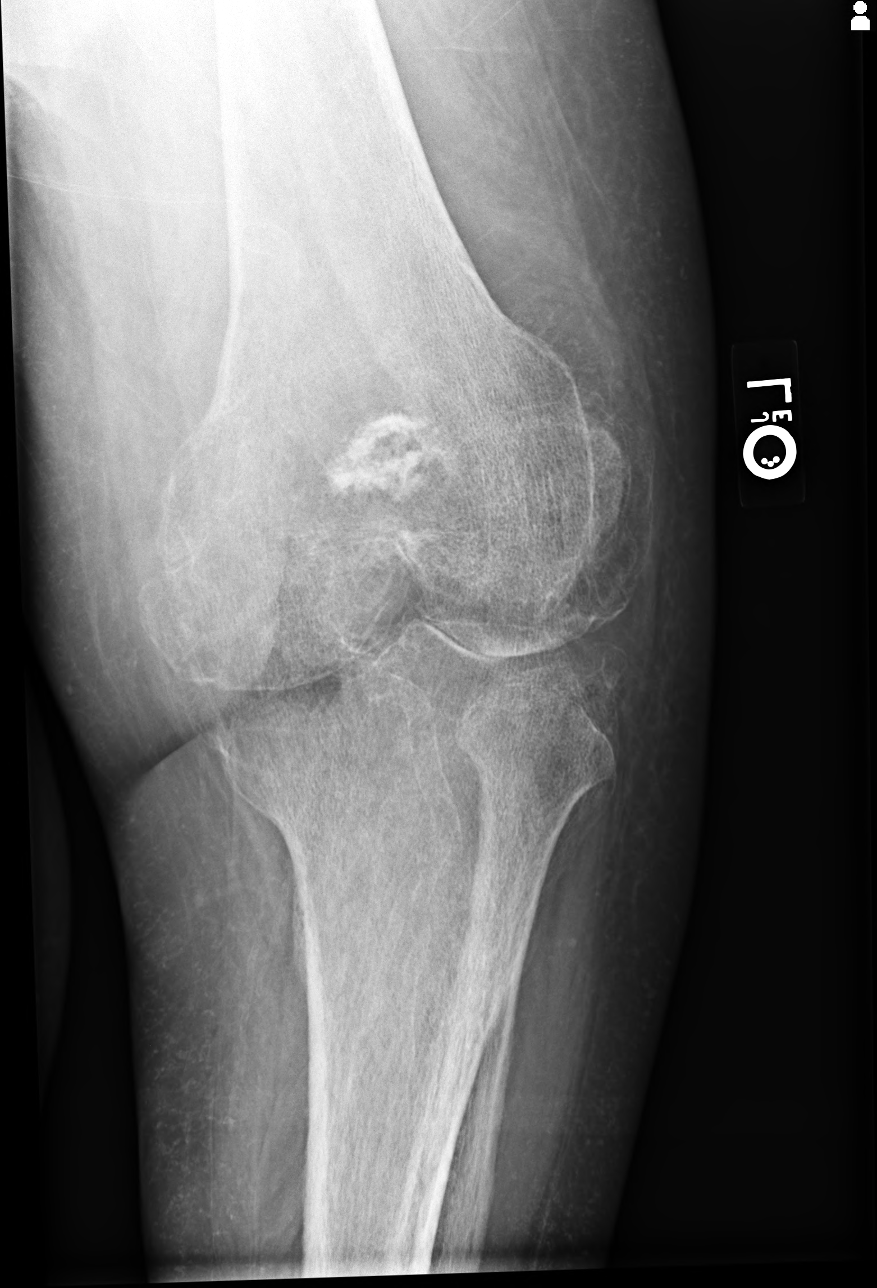
[im 2/4]
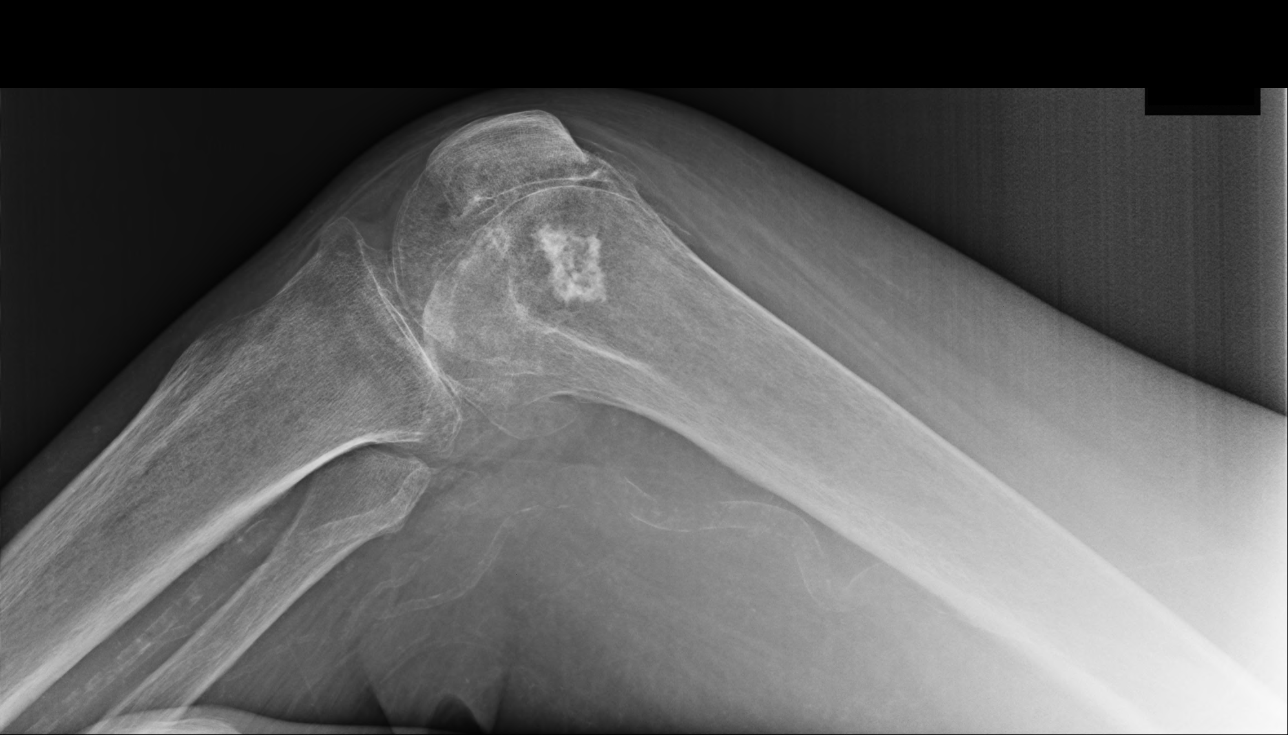
[im 3/4]
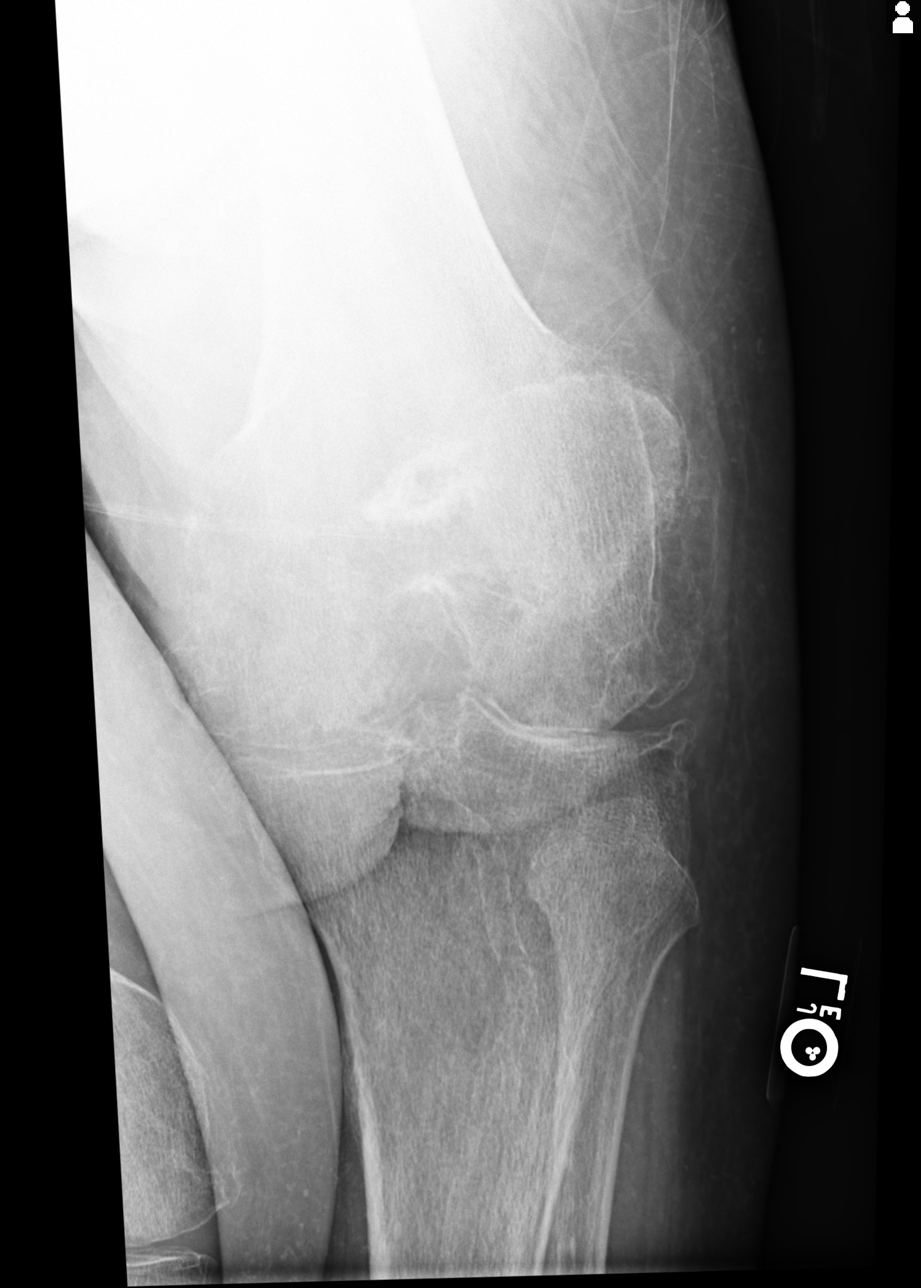
[im 4/4]
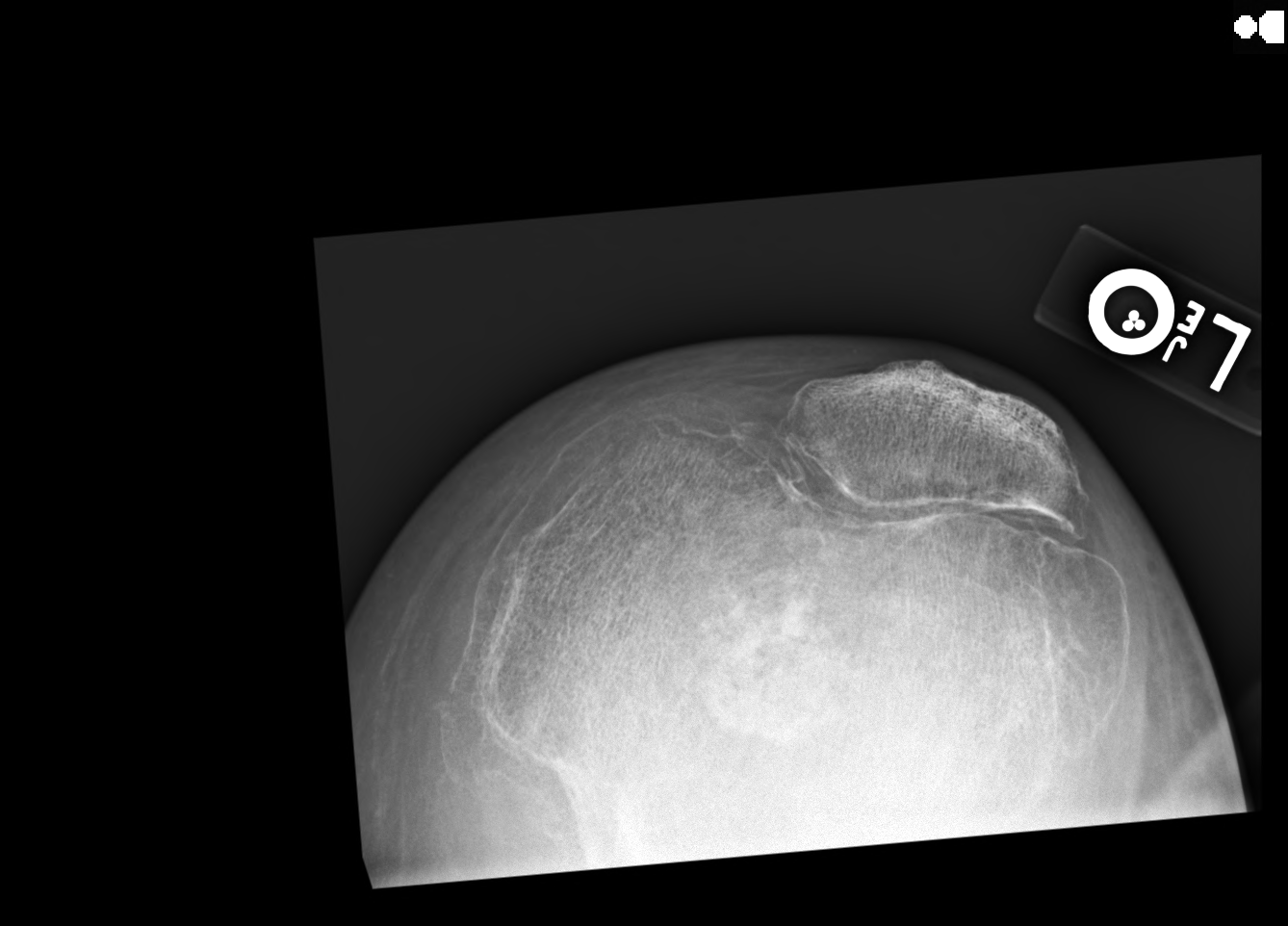

[4 of 4 positions shown; findings below may reference images not displayed]

FINDINGS: This examination is degraded due to difficulties with patient positioning.

Marked osseous demineralization. No displaced or radiographically evident fracture is identified. A 2.3 x 1.9 x 2.8 cm sclerotic osseous lesion is present within the distal femoral metaphysis, likely representing a low-grade chondroid series lesion. Severe tricompartmental knee osteoarthritis. No effusion. Diffuse vascular calcifications.
IMPRESSION: 1.
Degraded examination due to difficulties with patient positioning.

2.
Marked osseous demineralization, without displaced or radiographically evident fracture. Consider further evaluation with MRI if there is clinical concern for an acute fracture.

3.
Severe tricompartmental knee osteoarthritis.

4.
Sclerotic osseous lesion within the distal femoral metaphysis, likely representing a low-grade chondroid series lesion.

## 2022-03-19 IMAGING — CR HIPS BIL 3-4 VWS
1 series · 4 of 4 positions shown · non-contrast
Comparison: None available.

INDICATION: Pain in hips
TECHNIQUE: 2 views of bilateral hips are obtained.

[Series 2: lat · 0.17mm/px · 4 of 4 slices shown]
[im 1/4]
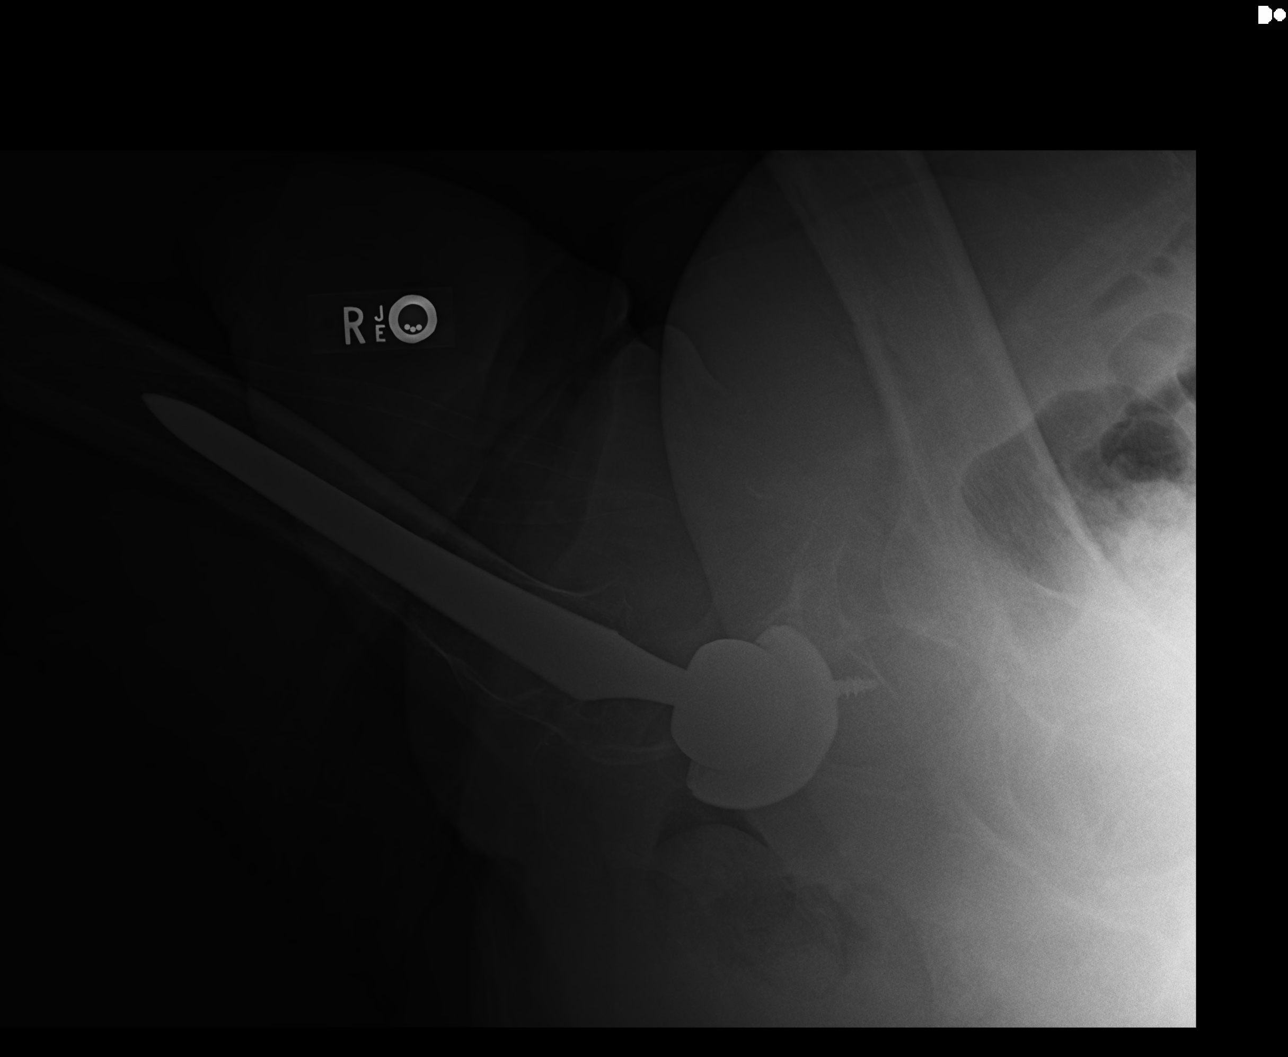
[im 2/4]
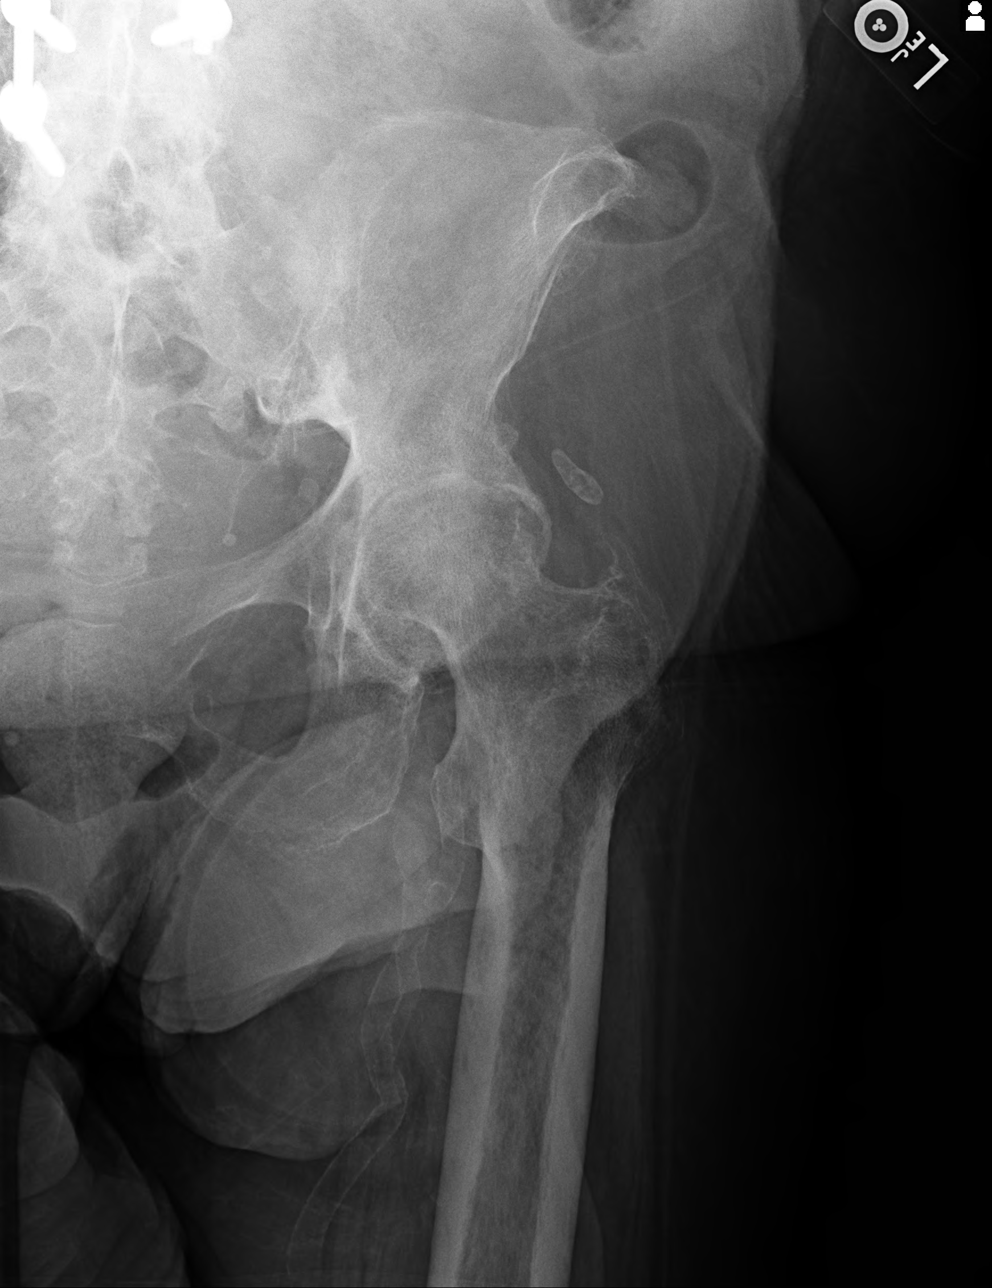
[im 3/4]
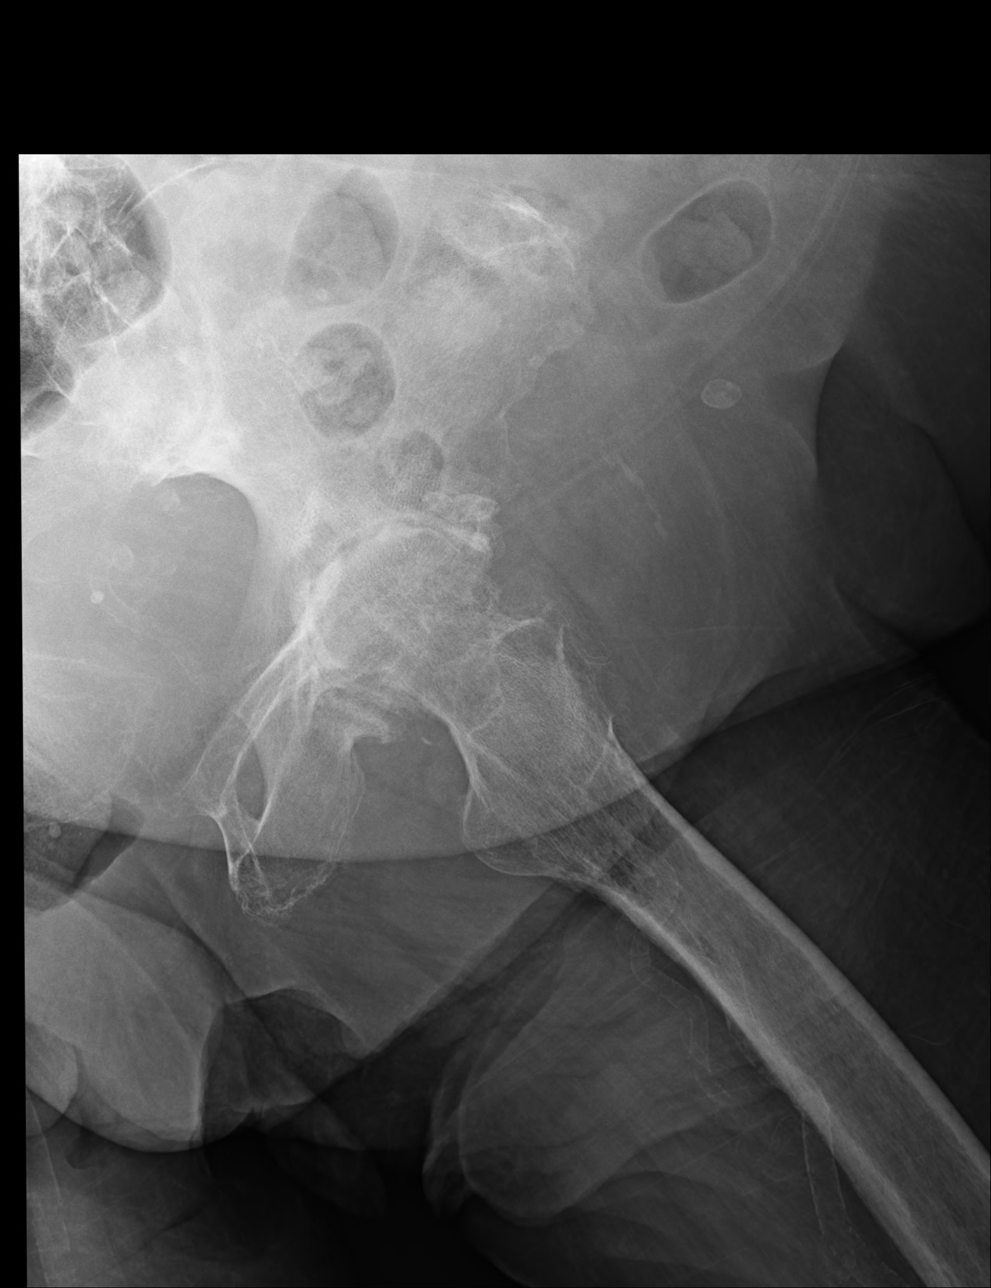
[im 4/4]
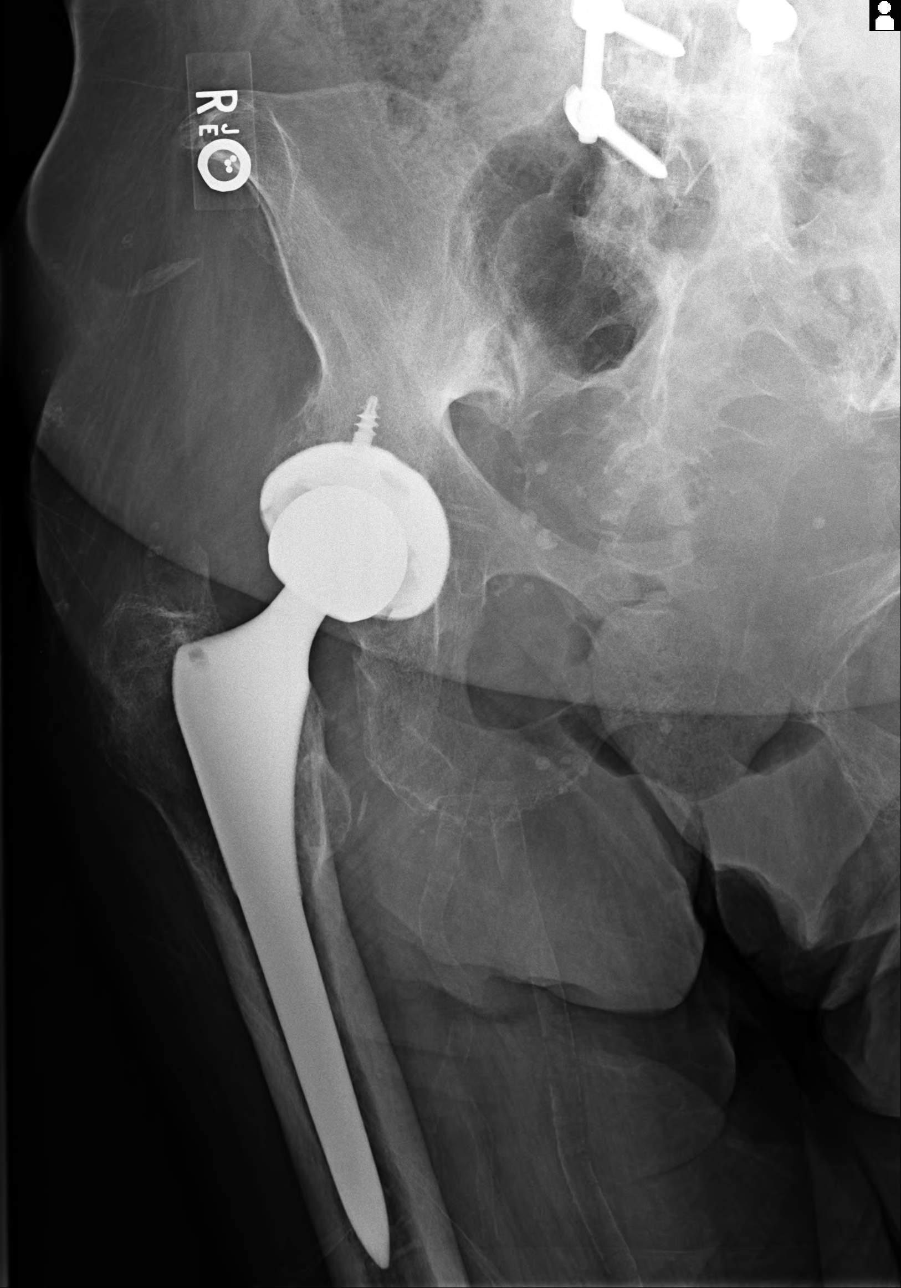

[4 of 4 positions shown; findings below may reference images not displayed]

FINDINGS: Marked osseous demineralization. No displaced or radiographically evident fracture is identified. Severe left hip osteoporosis, with bone-on-bone apposition. Partial visualization of postsurgical changes of the lower lumbar spine. Evidence of prior right total hip arthroplasty. No periprosthetic fracture or perihardware lucency is identified. Diffuse vascular calcifications are noted. Degenerative changes of bilateral sacroiliac joints are noted.
IMPRESSION: 1.
Marked osseous demineralization without displaced or radiographically evident fracture.

2.
Severe left hip osteoarthritis.

3.
Evidence of prior right total hip arthroplasty without periprosthetic fracture or perihardware lucency.

## 2022-03-19 IMAGING — CR KNEE RT 4 VWS MIN
1 series · 4 of 4 positions shown · non-contrast
Comparison: None available.

INDICATION: Pain in right knee.
TECHNIQUE: 4 views of the right knee are obtained.

[Series 1: ap · 0.17mm/px · 4 of 4 slices shown]
[im 1/4]
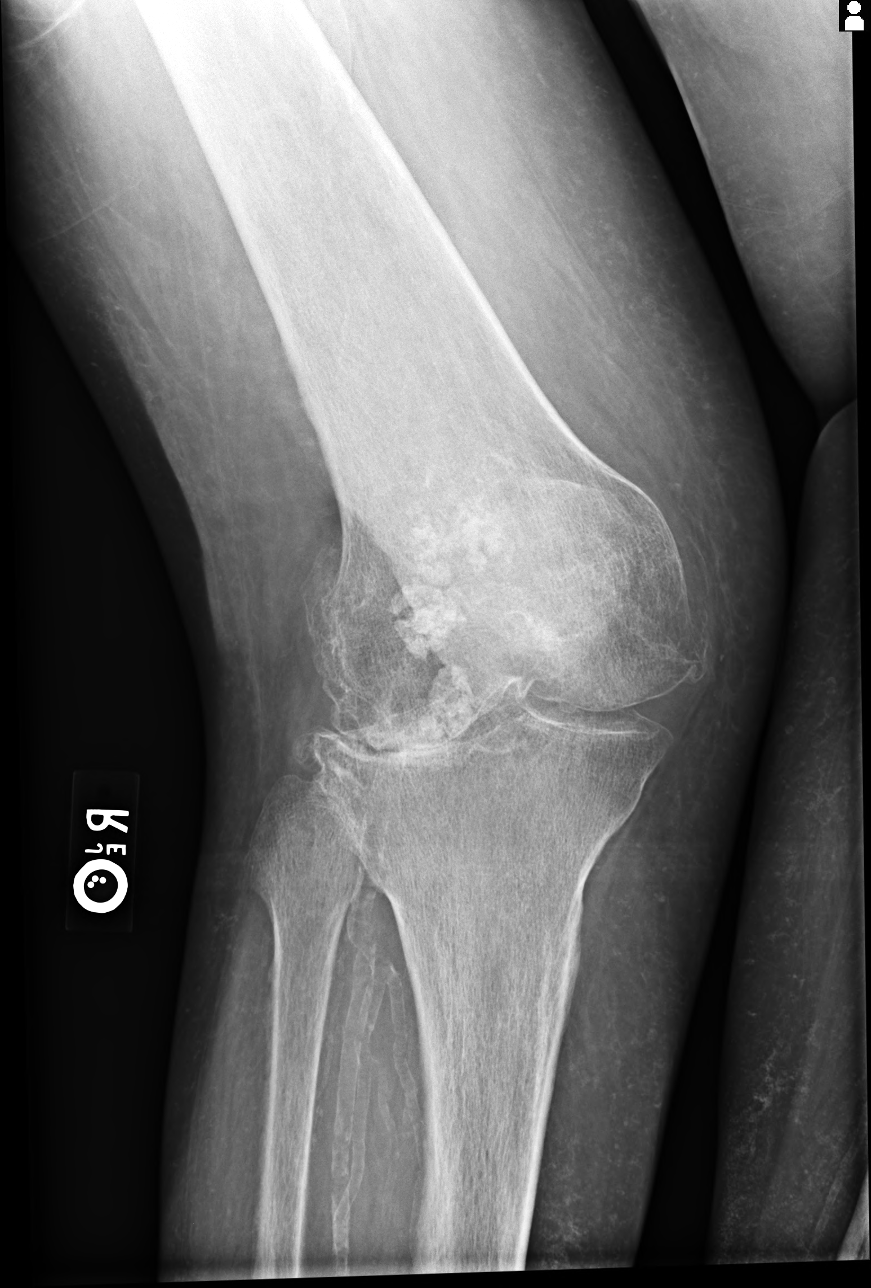
[im 2/4]
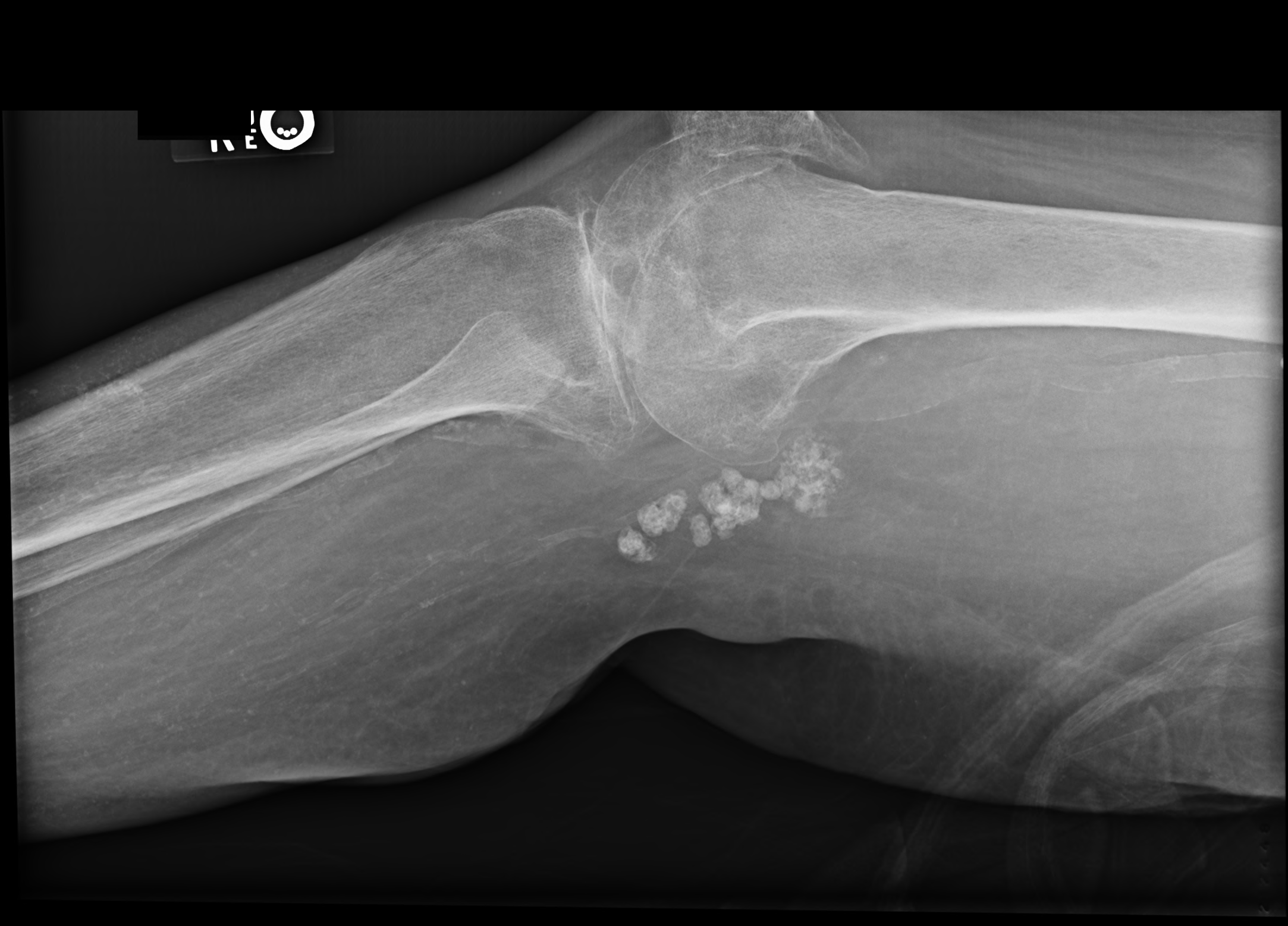
[im 3/4]
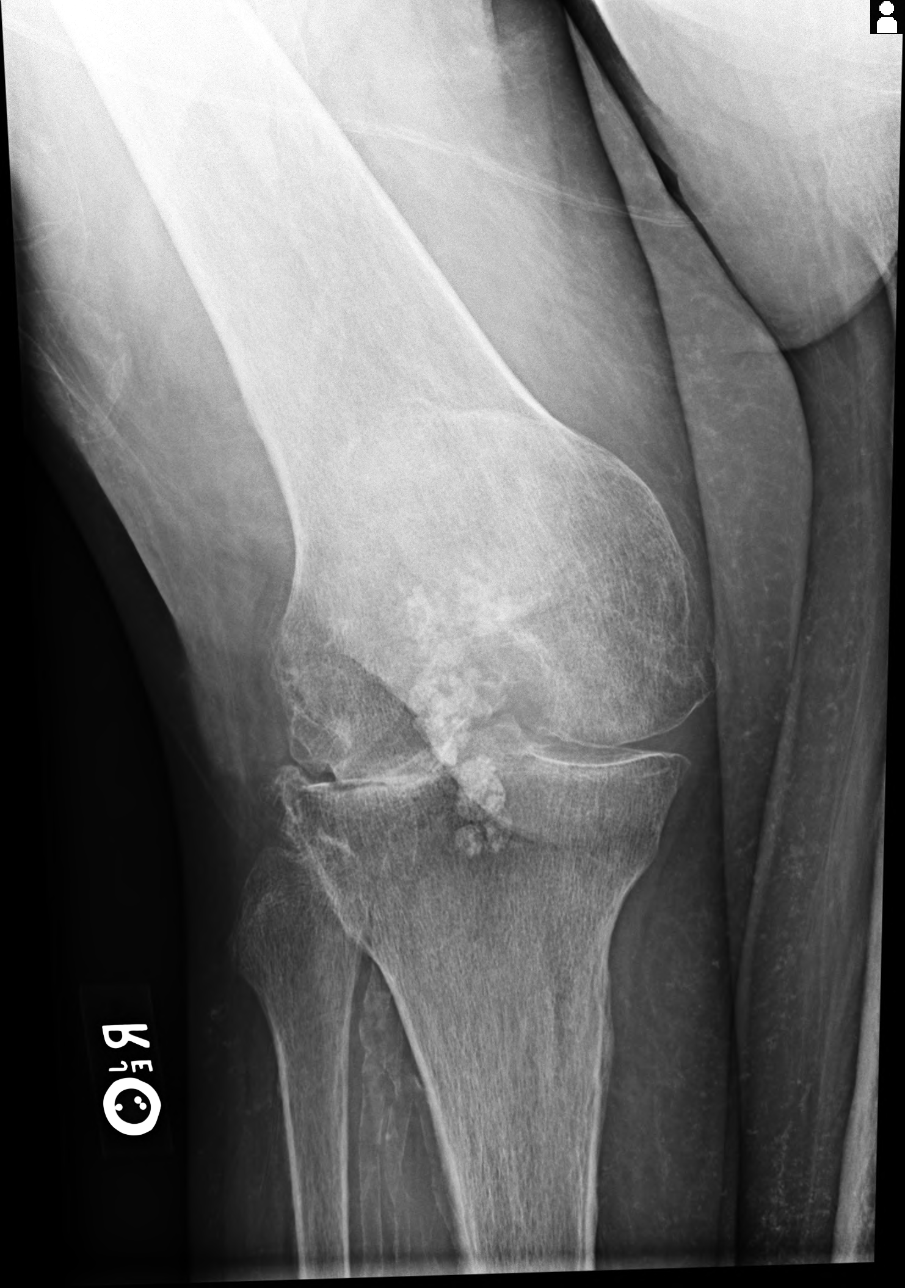
[im 4/4]
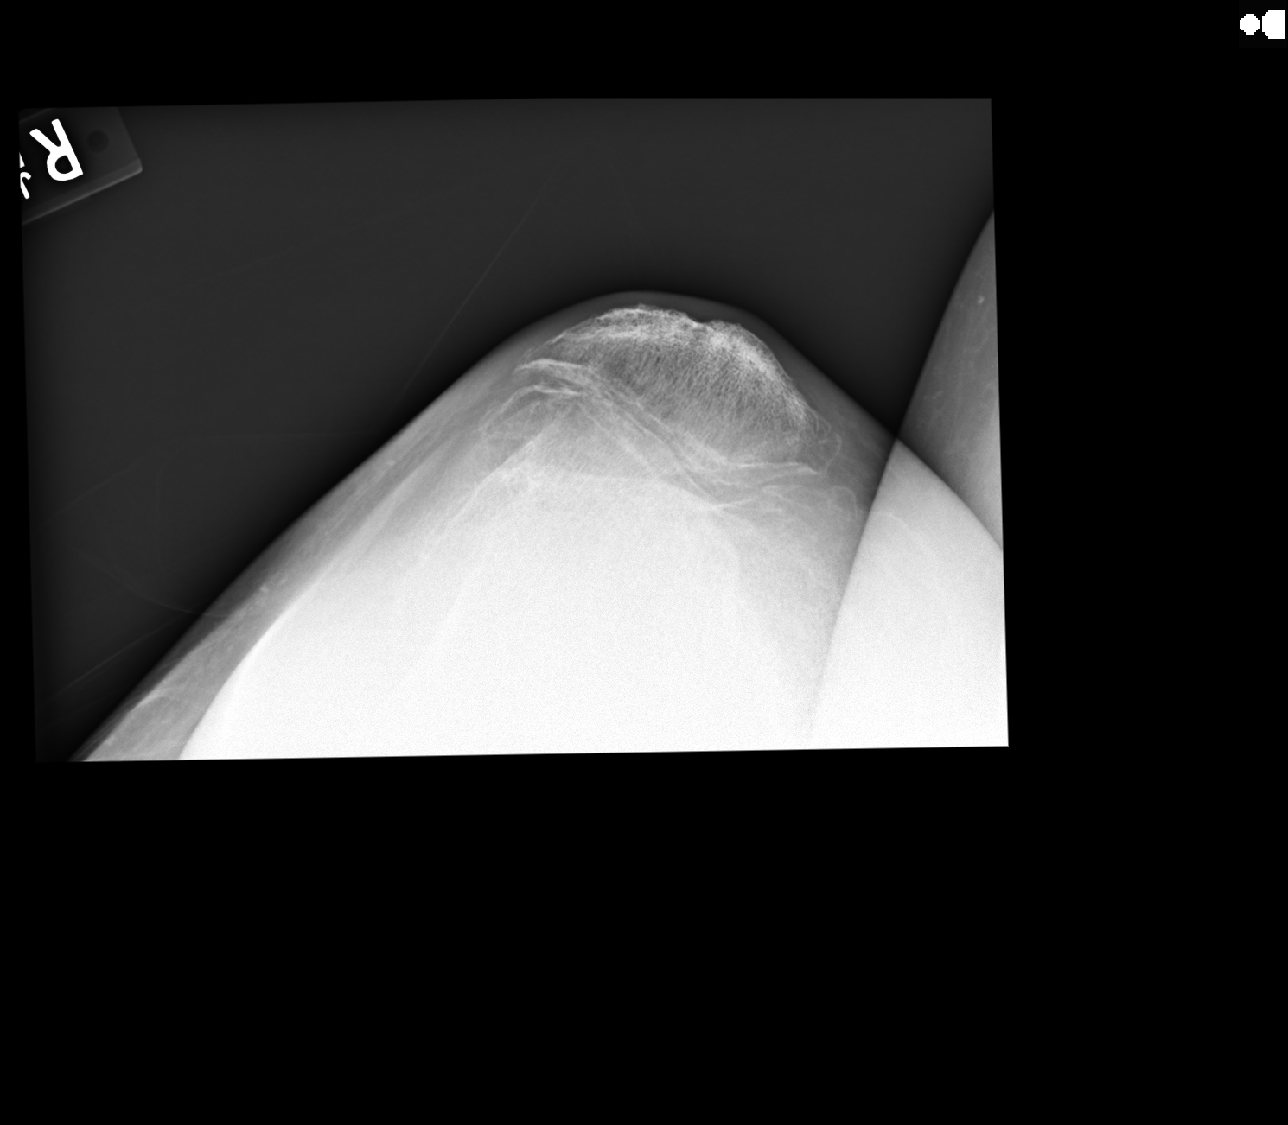

[4 of 4 positions shown; findings below may reference images not displayed]

FINDINGS: This examination is degraded due to difficulties in patient positioning.

Marked osseous demineralization. No displaced or radiographically evident fracture. Severe tricompartmental knee osteoarthritis, most pronounced in the lateral and patellofemoral compartments. Multiple ossific structures along the posterior aspect of the knee joint likely represent ossified bodies, possibly within a popliteal fossa cyst. Diffuse vascular calcifications are noted.
IMPRESSION: 1.
Degraded examination due to difficulties in patient positioning.

2.
No displaced or radiographically evident fracture. Consider further evaluation with MRI if there is concern for an acute fracture.

3.
Severe tricompartmental knee osteoarthritis.

4.
Suspected ossified bodies within a popliteal fossa cyst.

## 2022-04-06 IMAGING — MR MRI KNEE LT WO CONTRAST
4 of 5 series · 23 of 40 positions shown · non-contrast
Comparison: None.

INDICATION: Left knee pain.
TECHNIQUE: Multiplanar, multiecho imaging of the left knee was performed, including T1-weighted and fluid sensitive sequences without intravenous contrast administration.

[Series 7: t2_axial_fs · axial · left · 3.0mm · 0.50mm/px · z∈[-107,+9]mm · 8 of 30 slices shown]
[im 1/30]
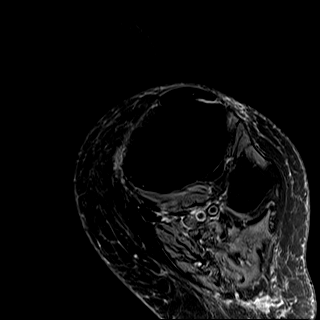
[im 5/30]
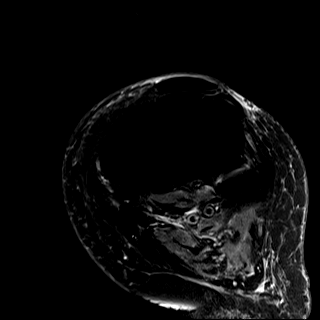
[im 9/30]
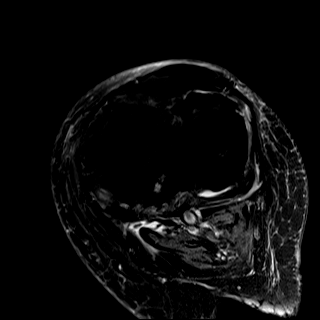
[im 13/30]
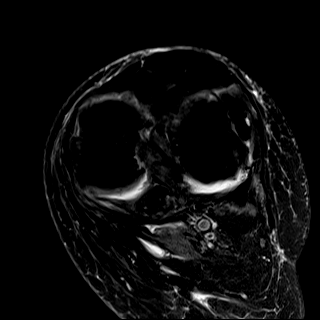
[im 17/30]
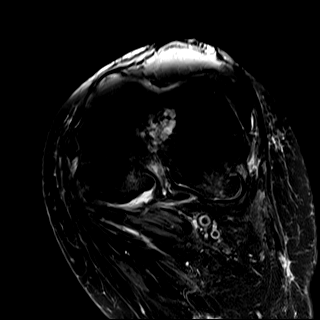
[im 21/30]
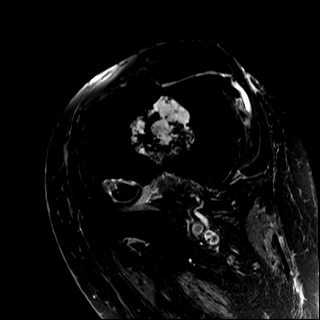
[im 25/30]
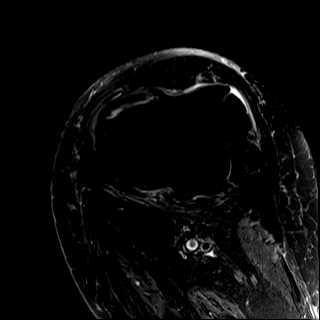
[im 30/30]
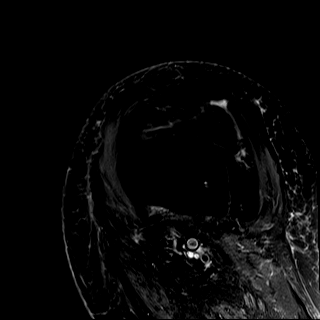

[Series 8: pd_sag_fs · oblique · left · 3.0mm · 0.28mm/px · 8 of 30 slices shown]
[im 1/30]
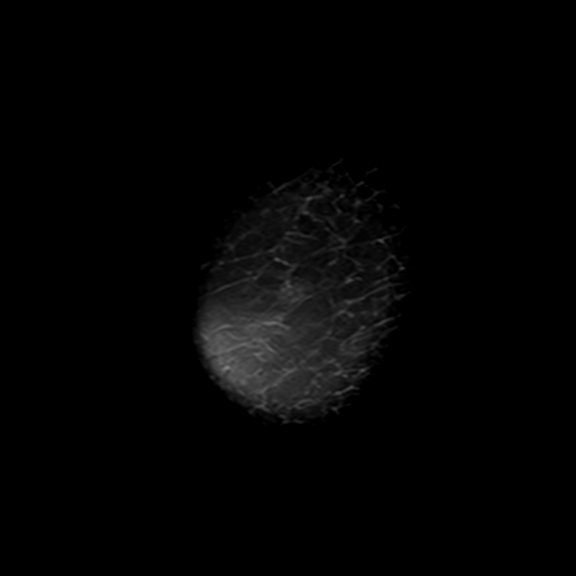
[im 5/30]
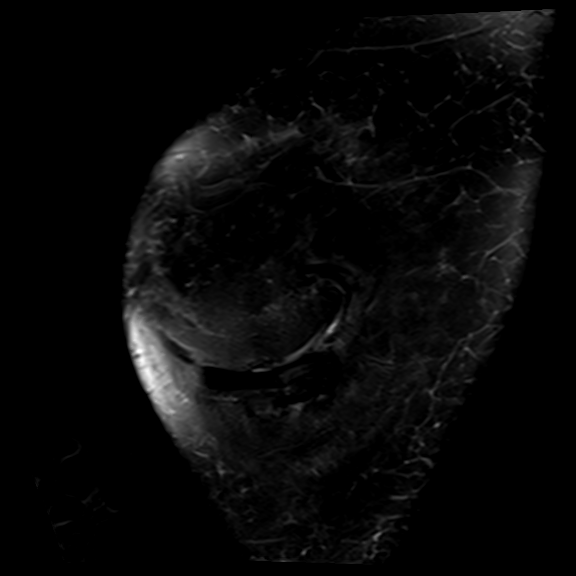
[im 9/30]
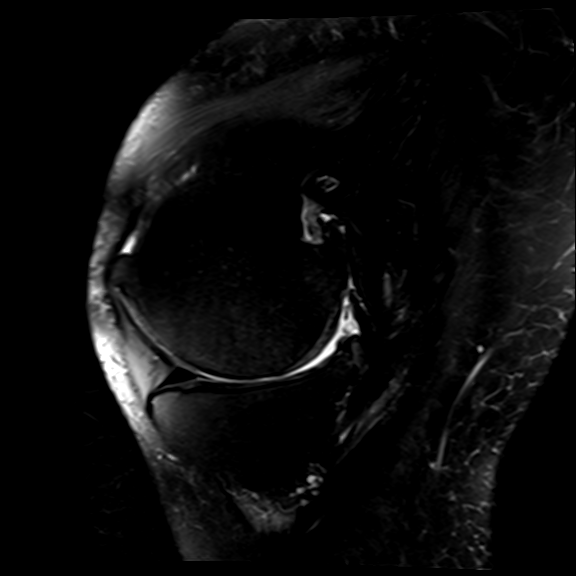
[im 13/30]
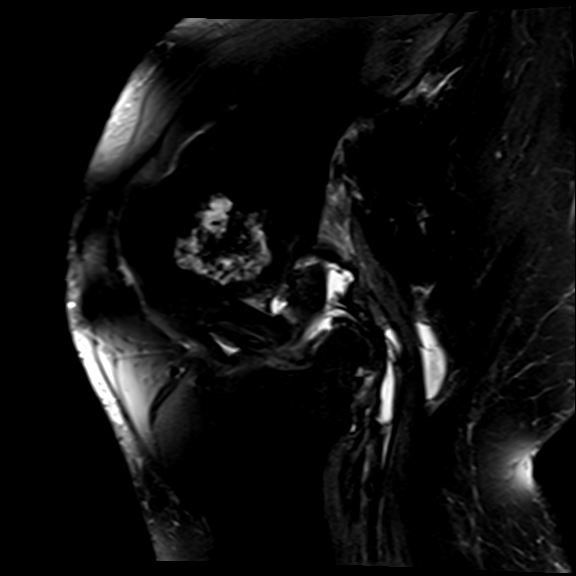
[im 17/30]
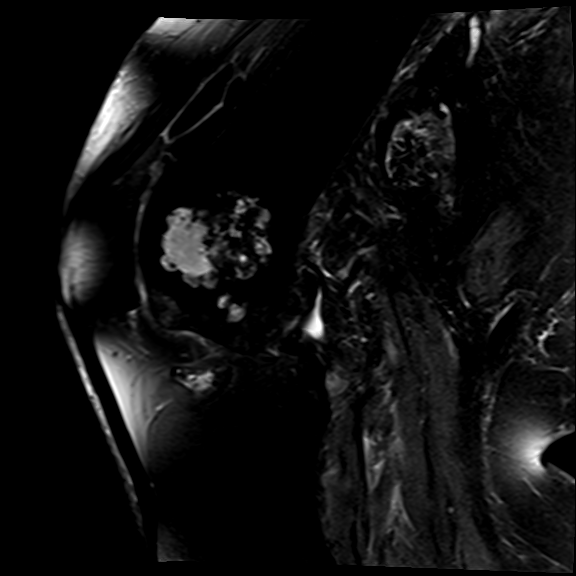
[im 21/30]
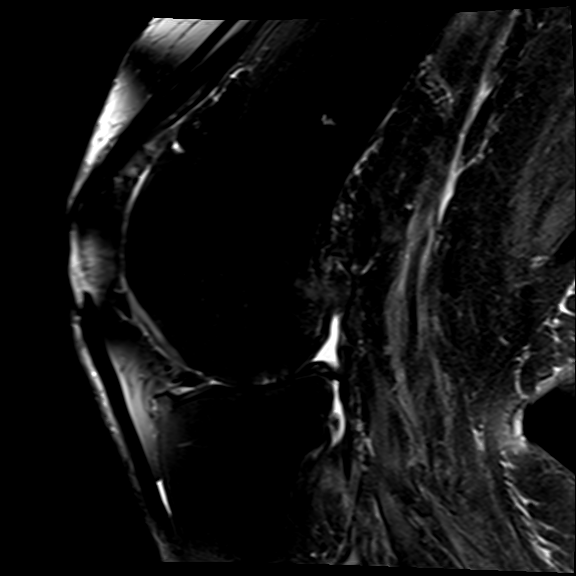
[im 25/30]
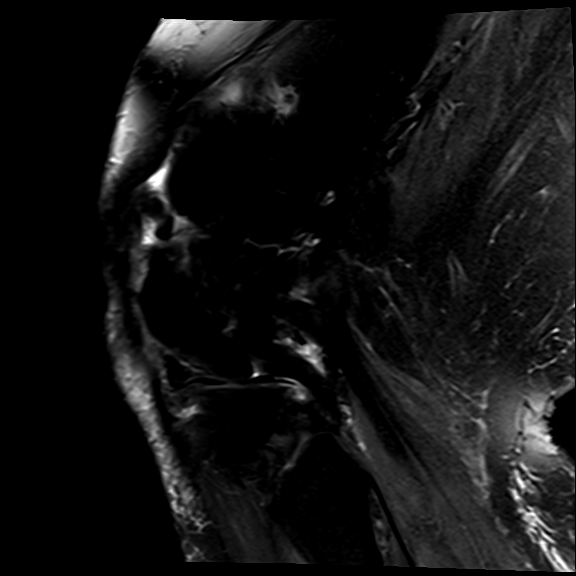
[im 30/30]
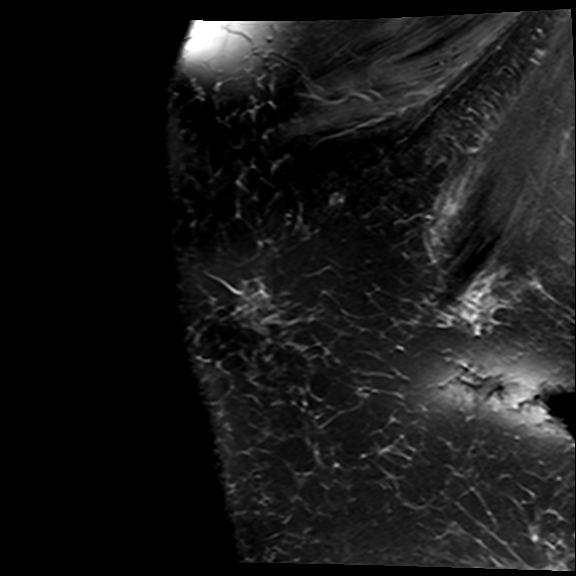

[Series 9: t2_sag_fs · oblique · left · 3.0mm · 0.26mm/px · 4 of 30 slices shown]
[im 1/30]
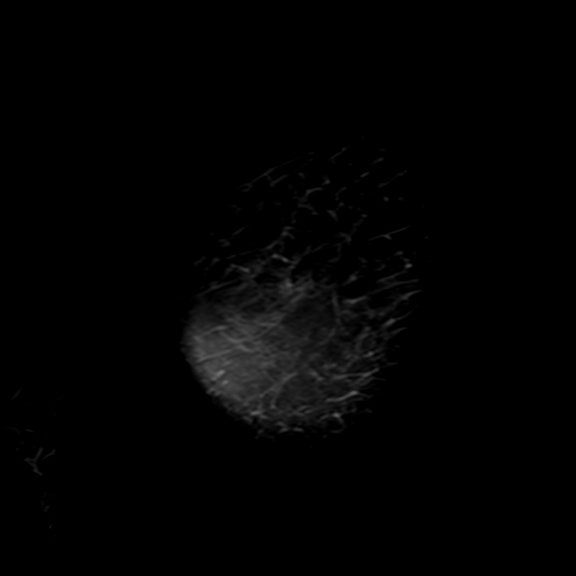
[im 5/30]
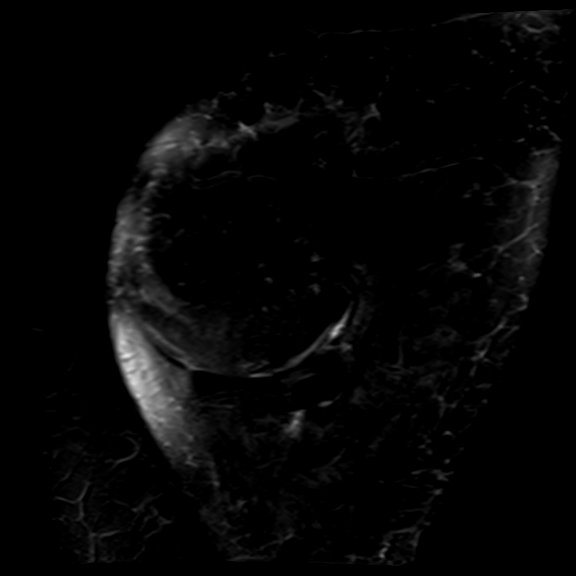
[im 17/30]
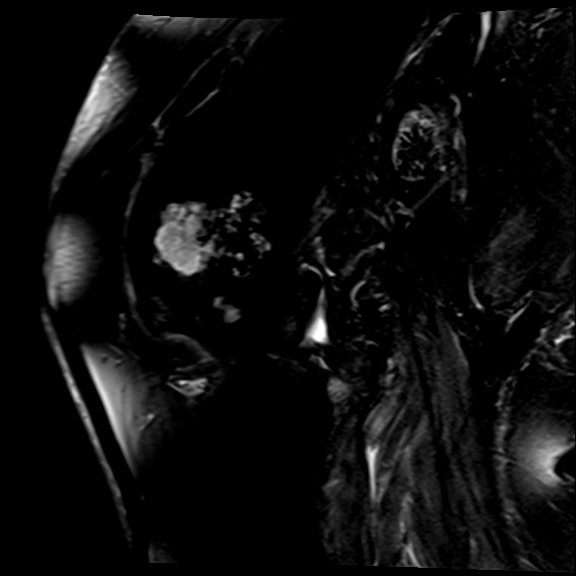
[im 25/30]
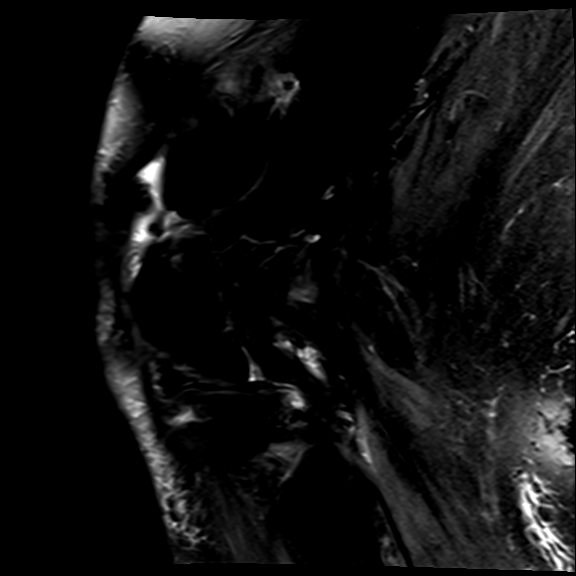

[Series 10: t1_cor · coronal · left · 3.5mm · 0.48mm/px · 3 of 30 slices shown]
[im 5/30]
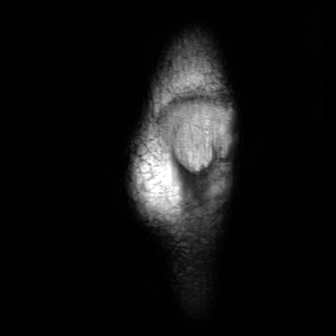
[im 17/30]
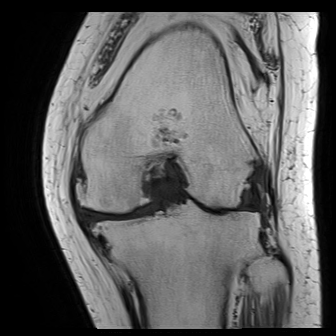
[im 25/30]
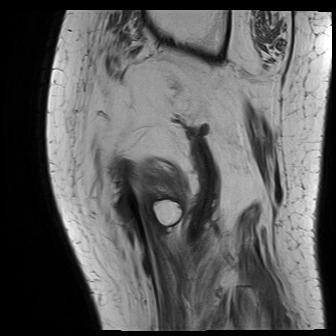

[23 of 40 positions shown; findings below may reference images not displayed]

FINDINGS: MEDIAL MENISCUS:  Horizontal tear posterior horn.

LATERAL MENISCUS:  Horizontal tear anterior horn.

ACL:  Intact.

PCL:  Intact.

MCL:  Intact.

LATERAL LIGAMENTS AND TENDONS:  Intact

EXTENSOR MECHANISM:  The quadriceps and patellar tendons are intact.

FAT PADS:   Normal.

CARTILAGE:  

Patellofemoral compartment:  Severe DJD.

Medial compartment:  Severe DJD.

Lateral compartment: Severe DJD.

Associated reactive marrow edema and marginal osteophytes all 3 compartments.

BONE MARROW: No acute fracture. 3 cm enchondroma of the distal femur.

No joint effusion.

No cystic or solid masses. No visible adenopathy.
IMPRESSION: 1.
Severe tricompartmental osteoarthritis.

2.
Horizontal tear of the posterior horn medial meniscus. Horizontal tear of the anterior horn lateral meniscus.

3.
3 cm enchondroma distal femur.

## 2022-04-06 IMAGING — MR MRI KNEE RT WO CONTRAST
4 of 6 series · 23 of 40 positions shown · non-contrast
Comparison: 2 views of the right knee are obtained.

INDICATION: Bilateral knee pain
TECHNIQUE: Multiplanar, multisequence imaging of the right knee was performed without contrast.

[Series 3: t2_axial_fs · axial · right · 3.0mm · 0.47mm/px · z∈[-42,+73]mm · 9 of 30 slices shown]
[im 1/30]
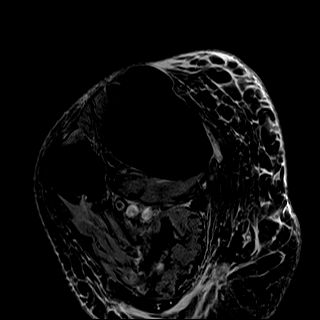
[im 4/30]
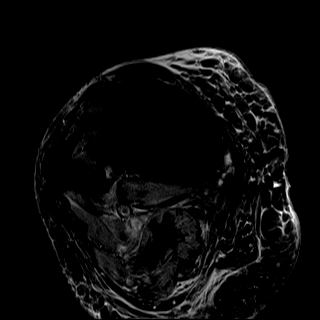
[im 8/30]
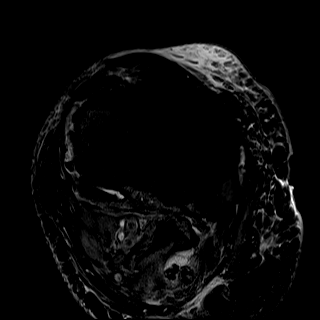
[im 11/30]
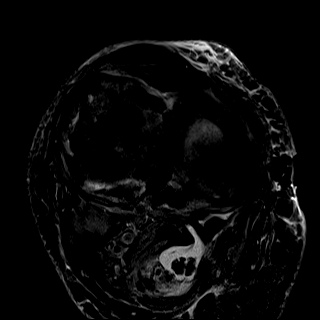
[im 15/30]
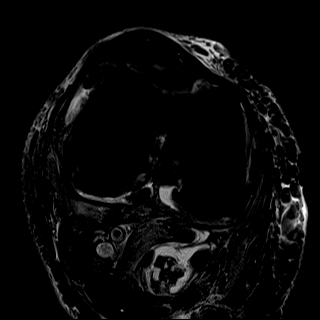
[im 19/30]
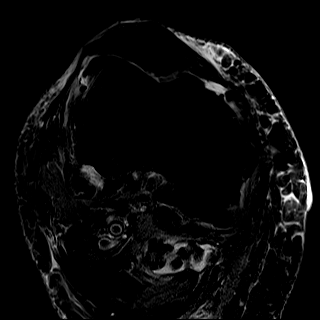
[im 22/30]
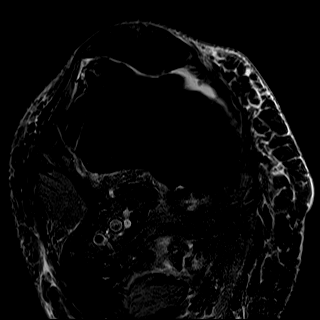
[im 26/30]
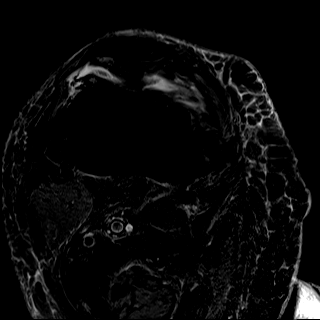
[im 30/30]
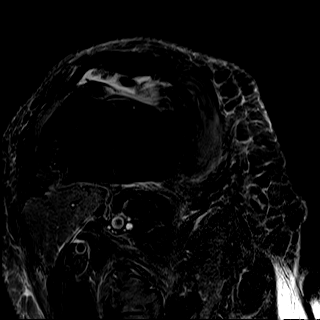

[Series 4: pd_sag_fs · sagittal · right · 3.0mm · 0.26mm/px · 7 of 28 slices shown]
[im 1/28]
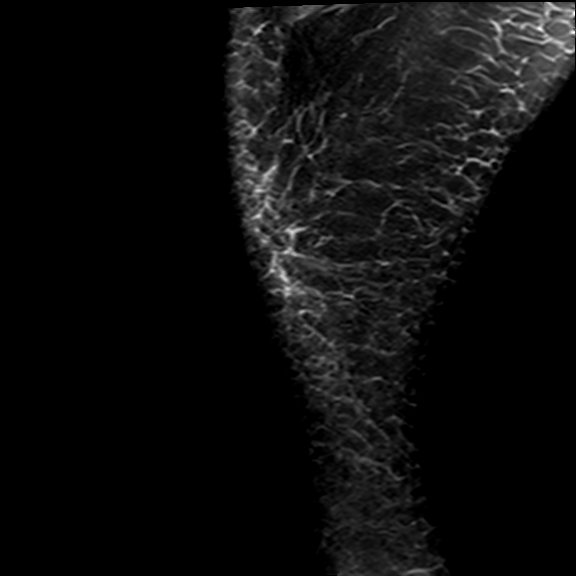
[im 5/28]
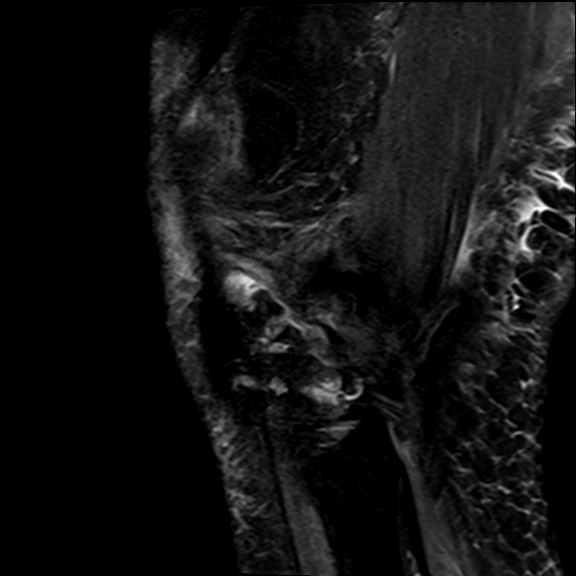
[im 10/28]
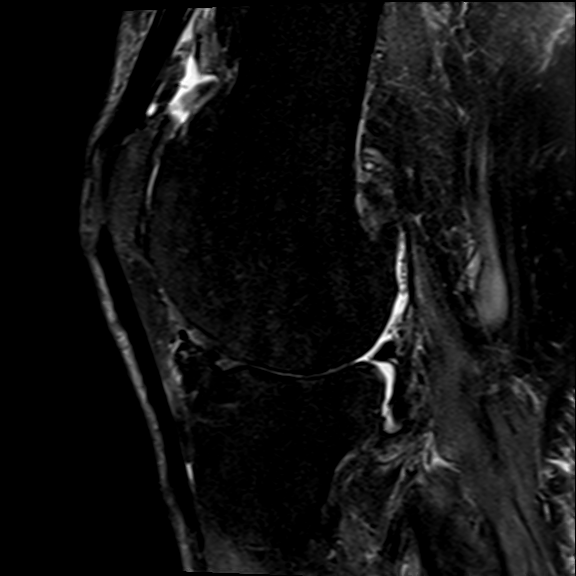
[im 14/28]
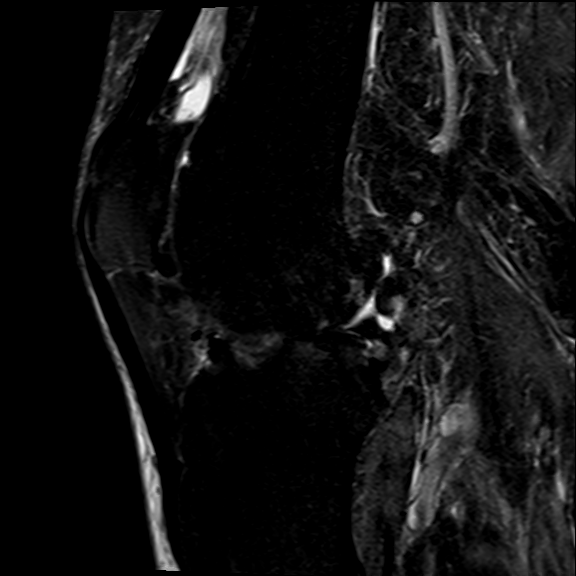
[im 19/28]
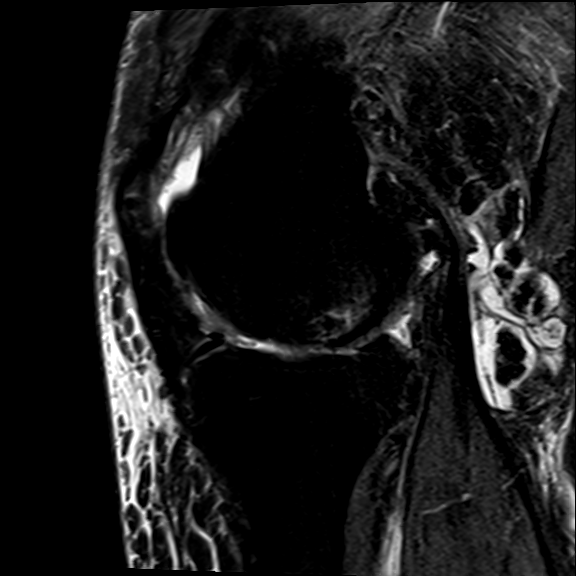
[im 23/28]
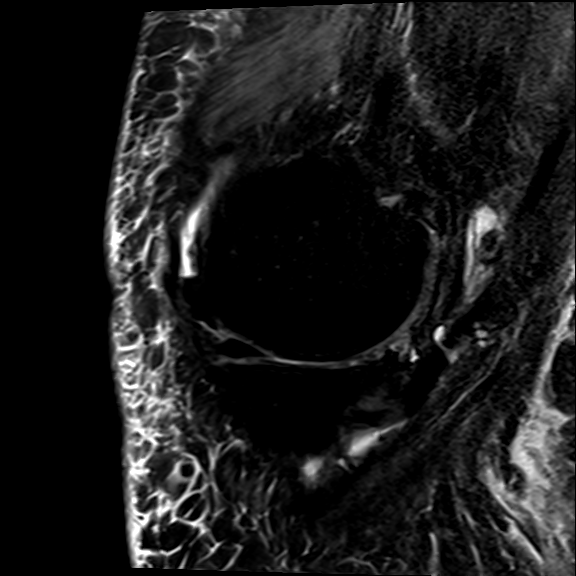
[im 28/28]
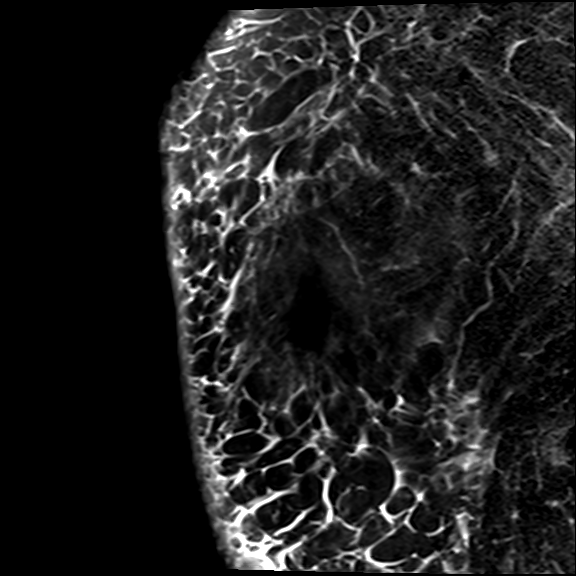

[Series 5: t2_sag_fs · sagittal · right · 3.0mm · 0.26mm/px · 4 of 28 slices shown]
[im 1/28]
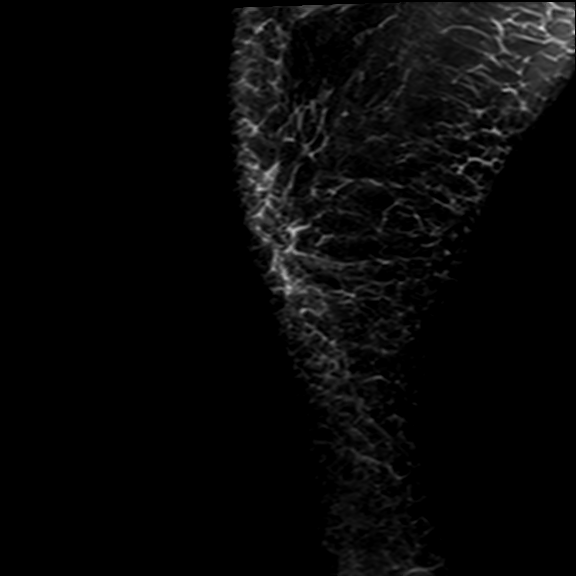
[im 5/28]
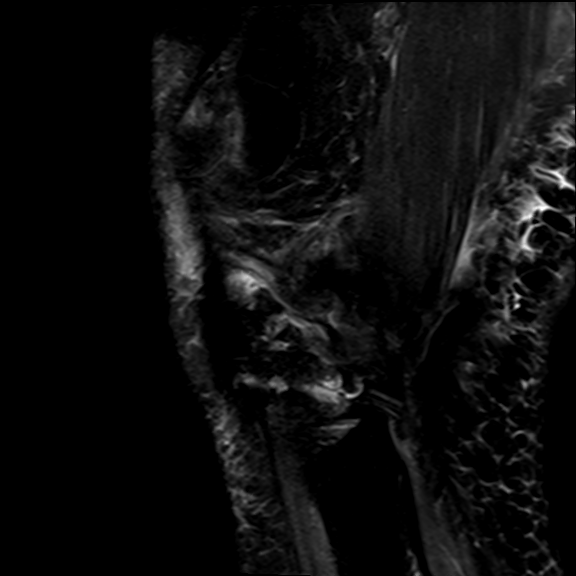
[im 14/28]
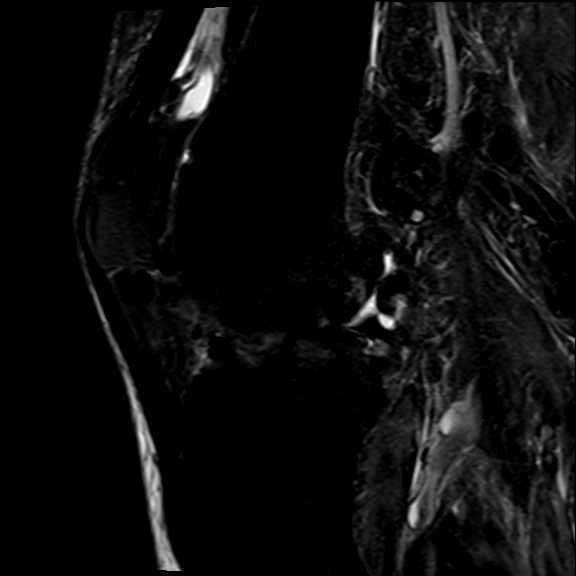
[im 23/28]
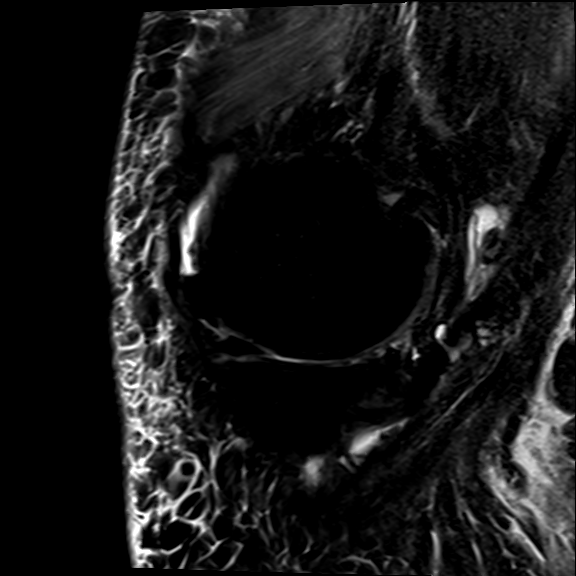

[Series 6: t1_cor · coronal · right · 3.5mm · 0.45mm/px · 3 of 28 slices shown]
[im 5/28]
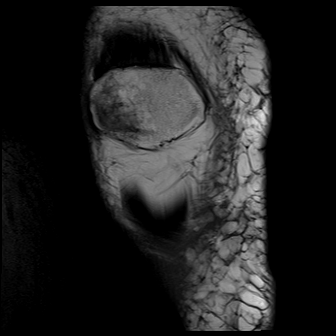
[im 14/28]
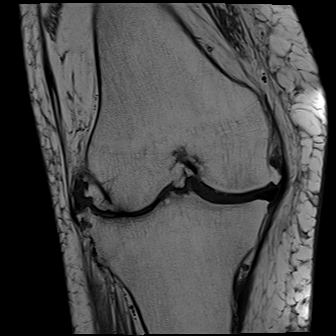
[im 23/28]
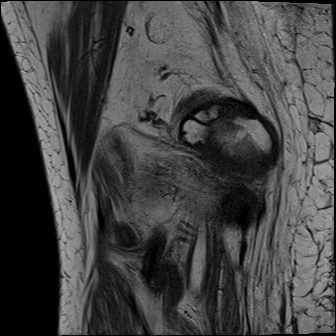

[23 of 40 positions shown; findings below may reference images not displayed]

FINDINGS: OSSEOUS: No acute fracture, avascular necrosis or aggressive osseous lesion.

MEDIAL JOINT COMPARTMENT:

Medial meniscus: Maceration posterior horn. Extrusion of the body into the medial gutter.

Articular cartilage: Diffuse irregular deep partial-thickness chondrosis throughout the weightbearing medial compartment articular cartilage. Moderate marginal osteophytes. Small subchondral osteophyte.

LATERAL JOINT COMPARTMENT:

Lateral meniscus: Maceration of the anterior horn and body, with extrusion of the body into the lateral gutter.

Articular cartilage: Diffuse full-thickness chondrosis, with bone-on-bone apposition and underlying subchondral cystic change and marrow edema. Large marginal osteophytes.

PATELLOFEMORAL JOINT AND EXTENSOR MECHANISM:

Quadriceps tendon: The visualized portion of the distal quadriceps tendon is intact.

Patella tendon: Intact.

Articular cartilage: Diffuse high-grade partial-thickness and full-thickness chondrosis throughout the patellofemoral compartment. Large marginal osteophytes.

Alignment: The alignment of the patellofemoral joint is within normal limits, in the imaged position.

LIGAMENTS:

Anterior cruciate ligament: Myxoid degeneration of the anterior cruciate ligament, with chronic partial-thickness tearing.

Posterior cruciate ligament: Intact.

Medial collateral ligament: Intact.

Lateral collateral ligament: Chronic partial-thickness tearing of the proximal fibers.

MUSCULOTENDINOUS: The visualized musculotendinous soft tissues about the knee are unremarkable.

OTHER: Moderate knee joint effusion. Large popliteal fossa cyst with multiple ossified bodies.
IMPRESSION: 1.
Severe tricompartmental knee osteoarthritis, with maceration of the medial and lateral menisci.

2.
Myxoid degeneration and chronic partial-thickness tearing of the anterior cruciate ligament.

3.
Moderate joint effusion and large popliteal fossa cyst with multiple ossified bodies.

## 2022-04-09 IMAGING — MR MRI HIP RT WO CONTRAST
5 series · 40 of 40 positions shown · non-contrast
Comparison: Hip radiographs 03/19/2022

INDICATION: Bilateral hip pain .
TECHNIQUE: Multiplanar, multiecho imaging of the right hip was performed, including T1-weighted and fluid sensitive sequences without intravenous contrast.

[Series 4: t1_cor · coronal · right · 4.0mm · 0.70mm/px · 5 of 26 slices shown]
[im 1/26]
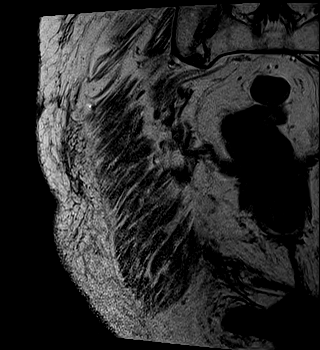
[im 7/26]
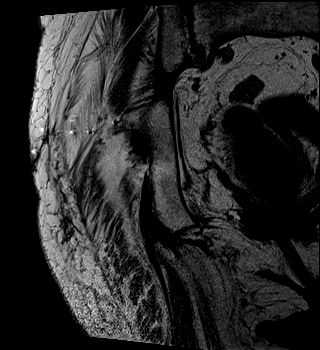
[im 13/26]
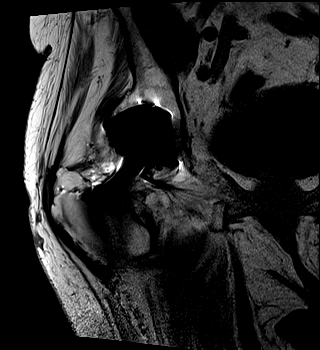
[im 19/26]
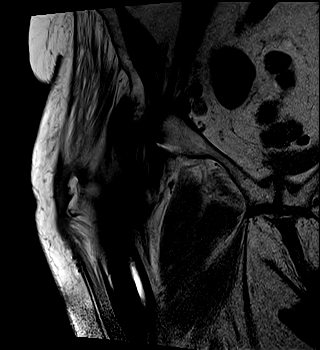
[im 26/26]
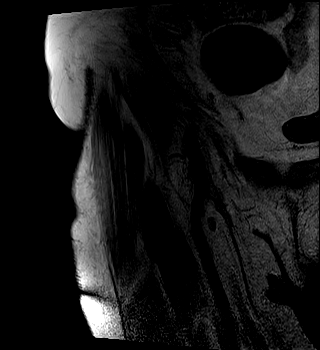

[Series 5: ir_cor · coronal · right · 4.0mm · 0.83mm/px · 6 of 26 slices shown]
[im 1/26]
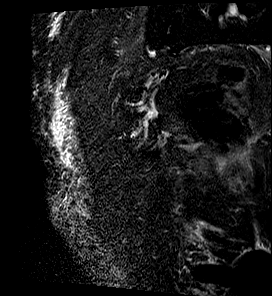
[im 6/26]
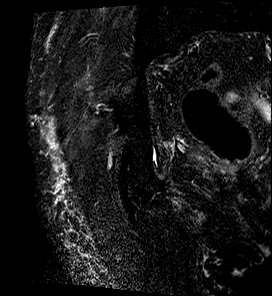
[im 11/26]
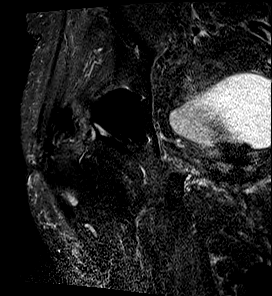
[im 16/26]
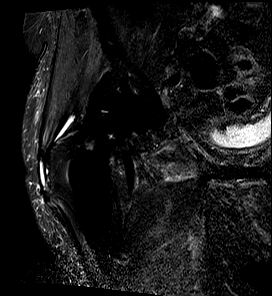
[im 21/26]
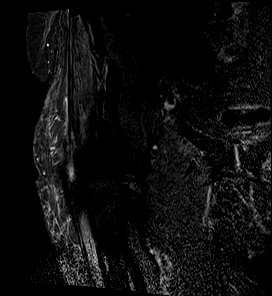
[im 26/26]
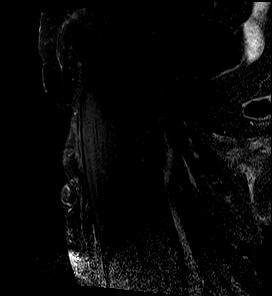

[Series 6: t1_axial · axial · right · 4.0mm · 0.72mm/px · z∈[-113,+121]mm · 11 of 48 slices shown]
[im 1/48]
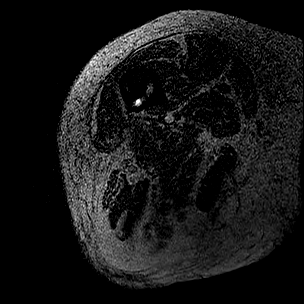
[im 5/48]
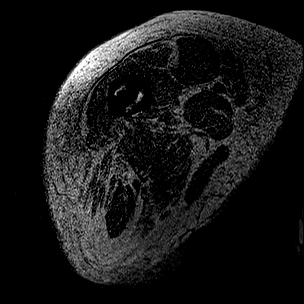
[im 10/48]
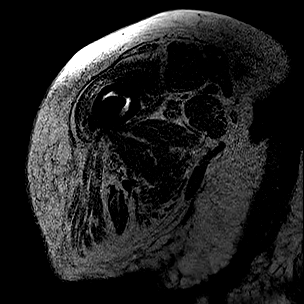
[im 15/48]
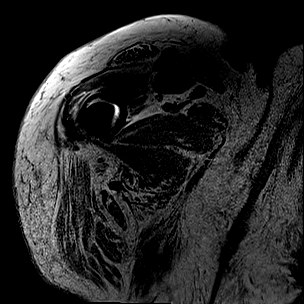
[im 19/48]
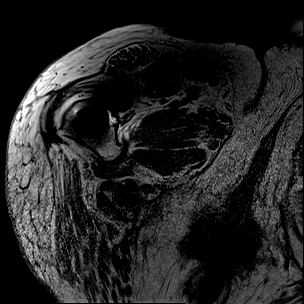
[im 24/48]
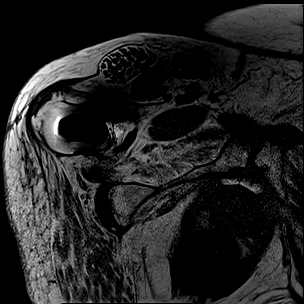
[im 29/48]
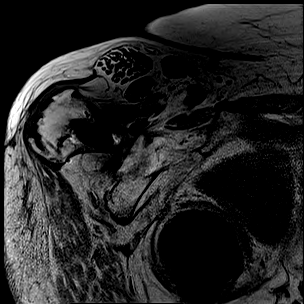
[im 33/48]
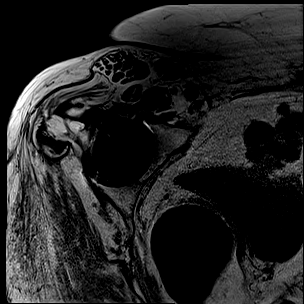
[im 38/48]
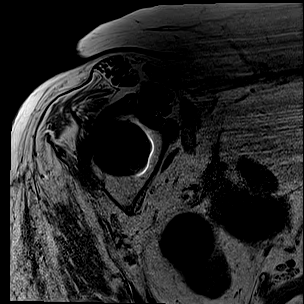
[im 43/48]
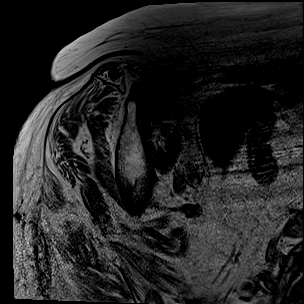
[im 48/48]
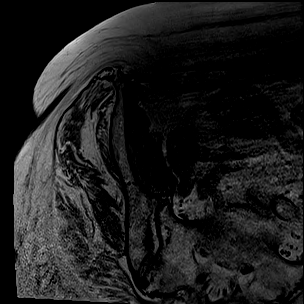

[Series 7: ir_axial · axial · right · 4.0mm · 0.86mm/px · z∈[-113,+121]mm · 11 of 48 slices shown]
[im 1/48]
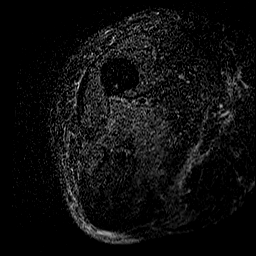
[im 5/48]
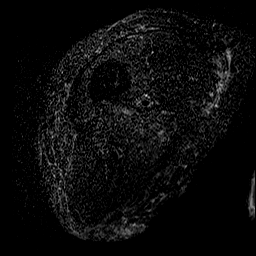
[im 10/48]
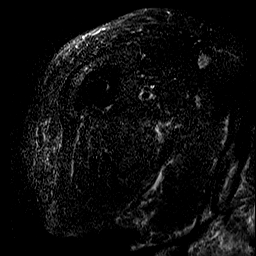
[im 15/48]
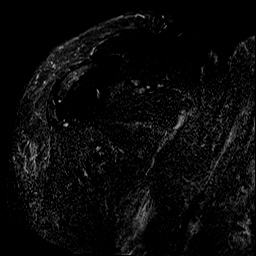
[im 19/48]
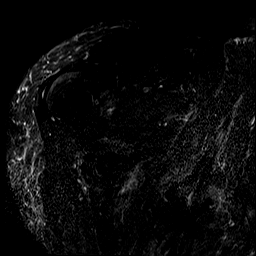
[im 24/48]
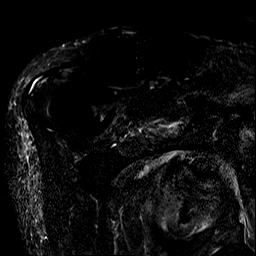
[im 29/48]
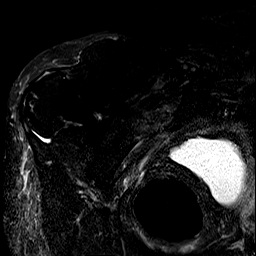
[im 33/48]
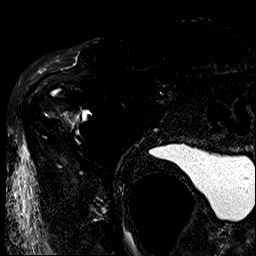
[im 38/48]
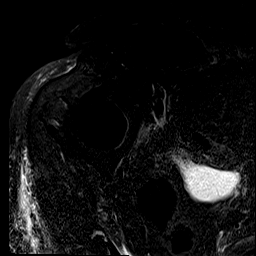
[im 43/48]
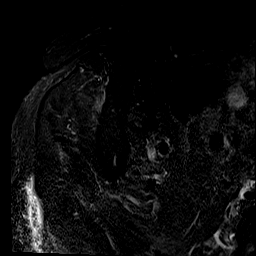
[im 48/48]
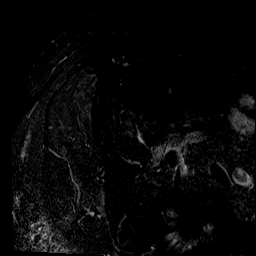

[Series 8: ir_sag · sagittal · right · 3.0mm · 0.90mm/px · 7 of 33 slices shown]
[im 1/33]
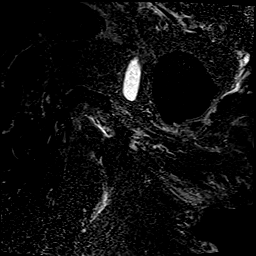
[im 6/33]
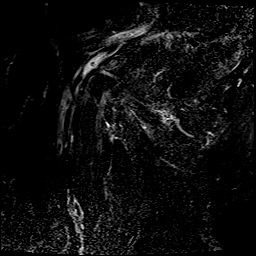
[im 11/33]
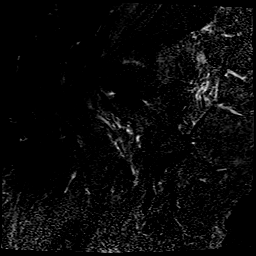
[im 17/33]
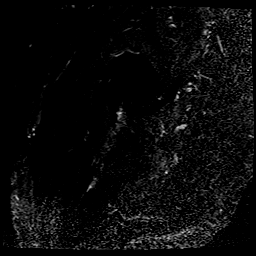
[im 22/33]
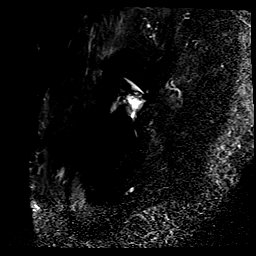
[im 27/33]
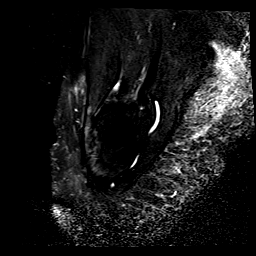
[im 33/33]
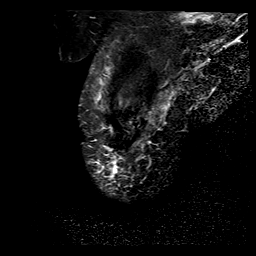

[40 of 40 positions shown; findings below may reference images not displayed]

FINDINGS: Marrow: Total right hip arthroplasty with no evidence of perihardware signal abnormality. No periprosthetic fracture. No erosion.

Visualized SI joints and pubic symphysis: Moderate pubic symphysis DJD. Moderate SI joint DJD.

Hip joints: Total right hip arthroplasty. No joint effusion.

Tendons: Iliopsoas and rectus femoris tendons are intact. Mild tendinosis of the hamstring origin. Large chronic full-thickness tears of the gluteus medius and minimus tendons with retraction.

Soft tissues: No acute muscle injury. Small trochanteric bursitis. Moderate atrophy of the gluteus medius and minimus muscles. Mild atrophy of the gluteus maximus muscle. Mild adductor muscle group atrophy.

No right inguinal adenopathy or hernia. No free fluid identified in the pelvis. Moderate scattered subcutaneous edema about the hip.
IMPRESSION: 1.
Total right hip arthroplasty with no evidence of hardware loosening or periprosthetic fracture.

2.
No hip joint effusion.

3.
Moderate pubic symphysis and SI joint DJD.

4.
Large chronic full-thickness tears of the right gluteus medius and minimus tendons, with moderate atrophy of the corresponding muscles.

## 2022-04-09 IMAGING — MR MRI HIP LT WO CONTRAST
4 of 6 series · 29 of 40 positions shown · non-contrast
Comparison: None available.

INDICATION: Left hip pain.
TECHNIQUE: Multiplanar, multisequence imaging of the left hip was performed without contrast.

[Series 2: t1_cor · sagittal · right · 4.0mm · 0.70mm/px · 6 of 24 slices shown]
[im 1/24]
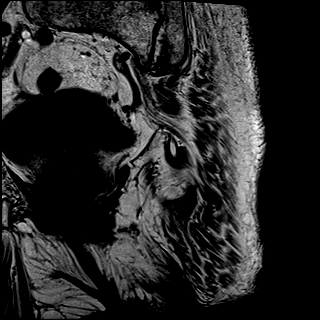
[im 5/24]
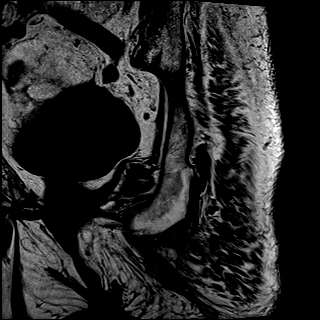
[im 10/24]
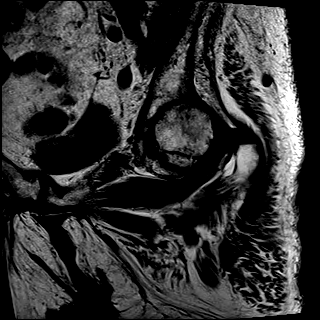
[im 14/24]
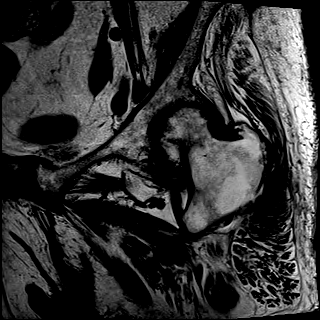
[im 19/24]
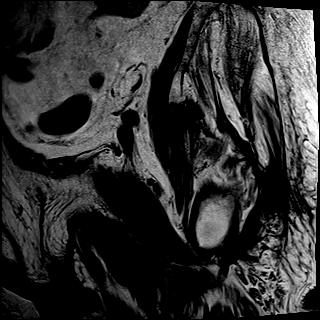
[im 24/24]
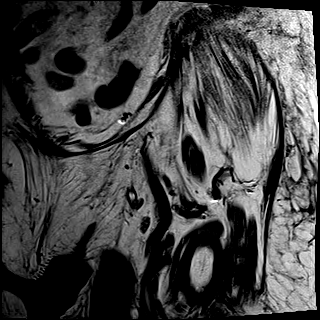

[Series 3: t2_cor_fs · sagittal · right · 4.0mm · 0.78mm/px · 5 of 24 slices shown]
[im 1/24]
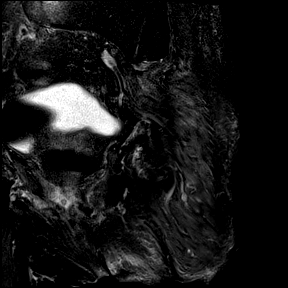
[im 6/24]
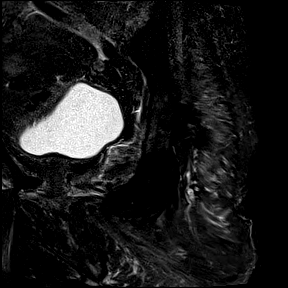
[im 12/24]
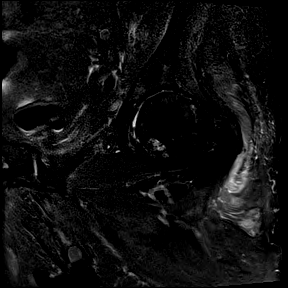
[im 18/24]
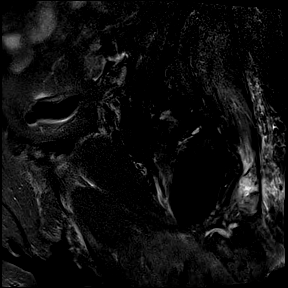
[im 24/24]
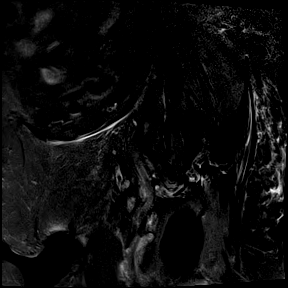

[Series 4: t1_axial · axial · right · 4.0mm · 0.37mm/px · z∈[-55,+135]mm · 9 of 40 slices shown]
[im 1/40]
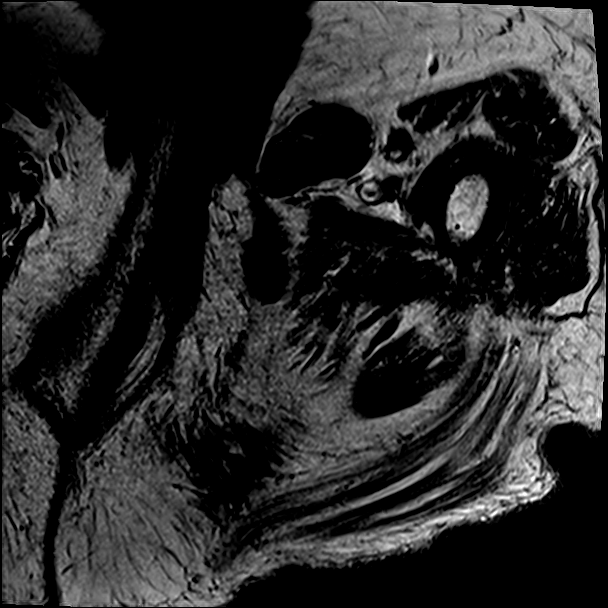
[im 5/40]
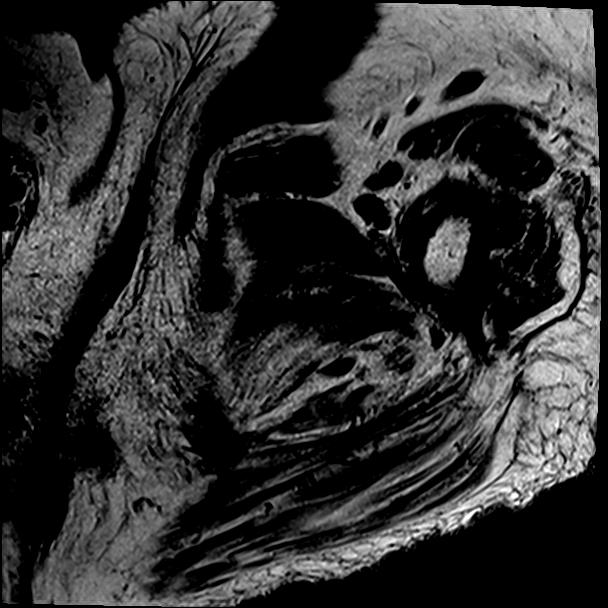
[im 10/40]
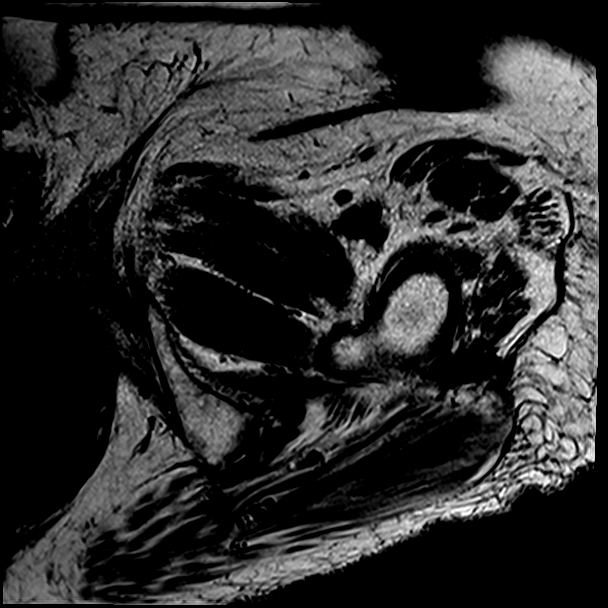
[im 15/40]
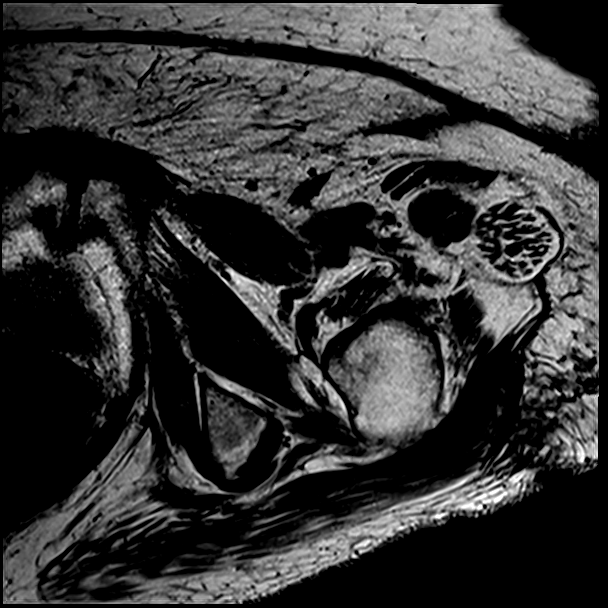
[im 20/40]
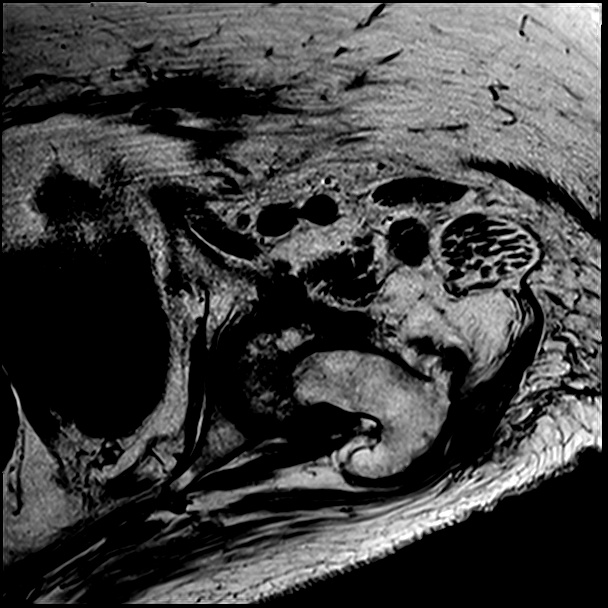
[im 25/40]
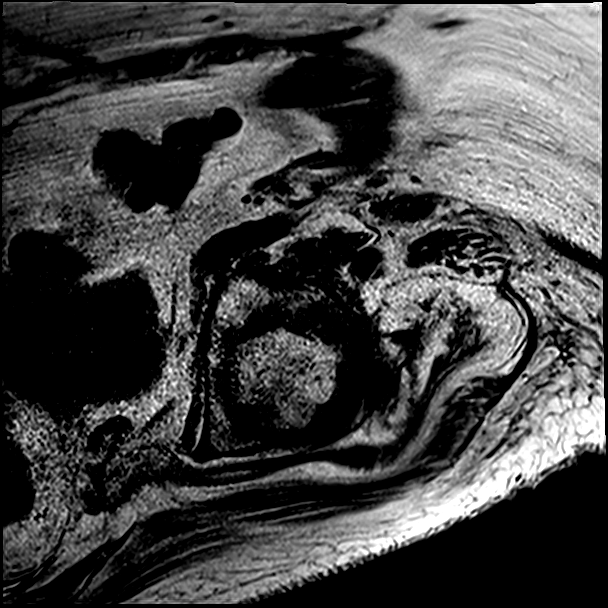
[im 30/40]
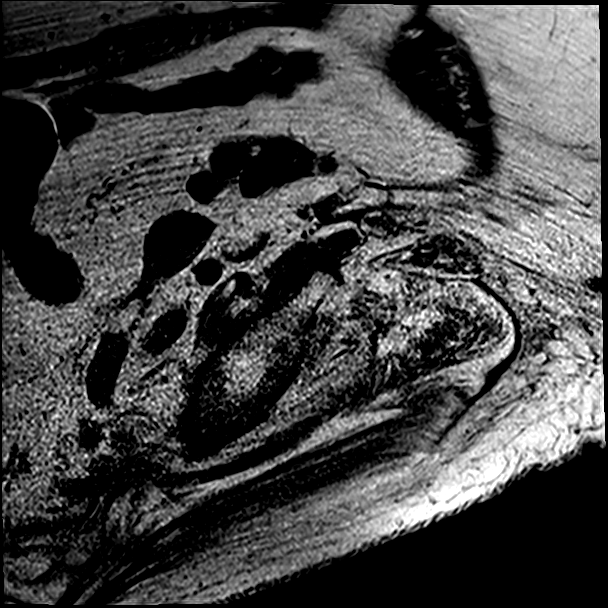
[im 35/40]
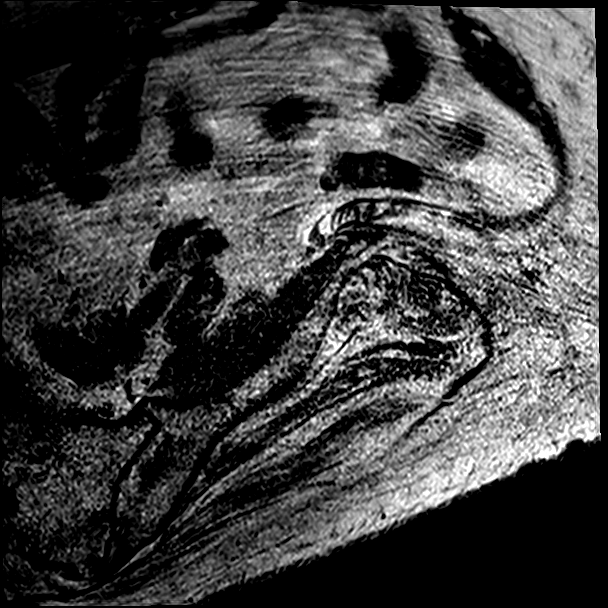
[im 40/40]
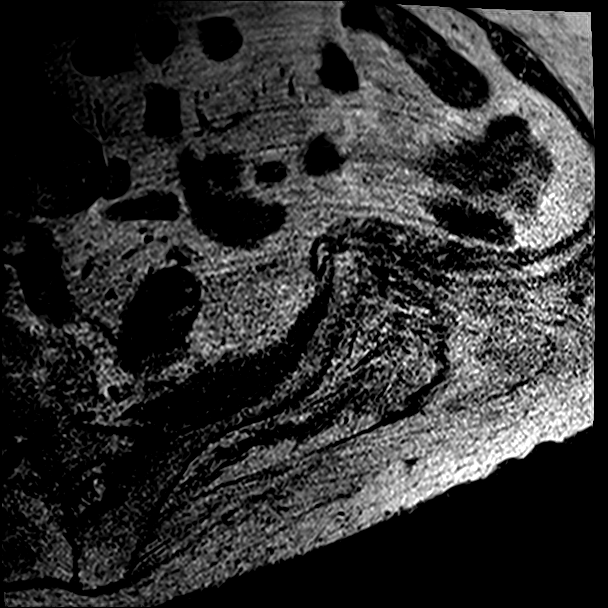

[Series 5: t2_axial_fs · axial · right · 4.0mm · 0.83mm/px · z∈[-55,+135]mm · 9 of 40 slices shown]
[im 1/40]
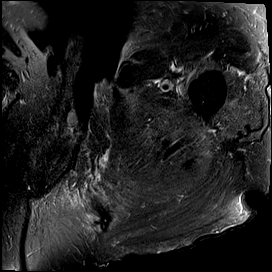
[im 5/40]
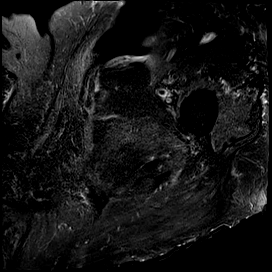
[im 10/40]
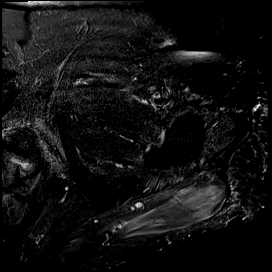
[im 15/40]
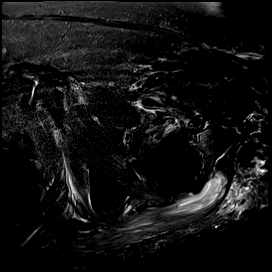
[im 20/40]
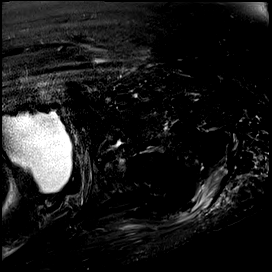
[im 25/40]
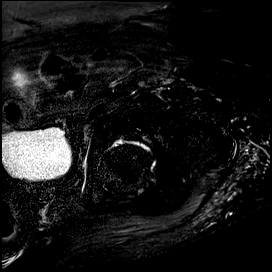
[im 30/40]
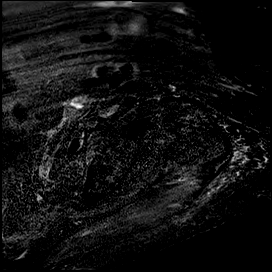
[im 35/40]
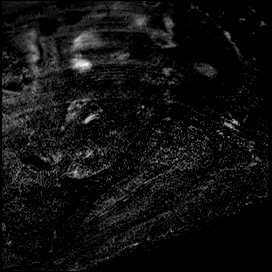
[im 40/40]
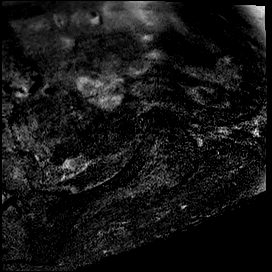

[29 of 40 positions shown; findings below may reference images not displayed]

FINDINGS: OSSEOUS: No acute fracture, avascular necrosis or aggressive osseous lesion. Moderate left sacroiliac joint osteoarthritis.

HIP JOINT: 

Labrum: Degeneration and tearing of the anterior-superior and posterior-superior labrum.

Articular cartilage: Diffuse full-thickness chondrosis of the femoroacetabular articular cartilage. Multifocal underlying subchondral cystic change and marrow edema. Large marginal osteophytes.

Effusion: Effusion.

GLUTEAL TENDONS AND GREATER TROCHANTERIC BURSA:

Gluteus medius tendon: Intact.

Gluteus minimus tendon: Intact.

Greater trochanteric bursa: Small to moderate fluid.

OTHER MUSCULOTENDINOUS STRUCTURES:

Rectus femoris tendon: Intact.

Iliopsoas tendon: Intact.

Proximal hamstring tendon origin: Moderate tendinosis.

Intramuscular edema and fatty atrophy: Edema is present within the inferior fibers of the gluteus maximus muscle. Scattered muscle fatty atrophy, most pronounced involving the gluteus medius and minimus muscles.

SCIATIC NERVE: The visualized course and caliber of the sciatic nerve is unremarkable.

OTHER: Diverticulosis of the sigmoid colon. The remaining visualized intrapelvic contents are unremarkable in appearance.
IMPRESSION: 1.
Severe left hip osteoarthritis.

2.
Small to moderate fluid in the greater trochanteric bursa. Correlate with clinical signs of bursitis.

## 2022-06-29 ENCOUNTER — Ambulatory Visit: Attending: Cardiovascular Disease | Primary: Family

## 2022-06-29 ENCOUNTER — Encounter: Attending: Cardiovascular Disease | Primary: Family

## 2022-07-11 MED ORDER — RIVAROXABAN 15 MG PO TABS
15 MG | ORAL_TABLET | Freq: Every day | ORAL | 1 refills | Status: AC
Start: 2022-07-11 — End: ?

## 2022-07-11 NOTE — Telephone Encounter (Signed)
Per Doyle Askew, NP on  12-19-2021 "Continue Xarelto 15 mg daily for CVA risk reduction"  Refill given for a month. Patient needs to call back to set up an appointment with NP. Message left to patient.

## 2022-08-16 IMAGING — CR WRIST LT 3 VWS MIN
4 series · 4 of 4 positions shown · non-contrast
Comparison: None

Images Obtained from [facility]
REASON FOR EXAM: Pain

[PA (1 of 2)]
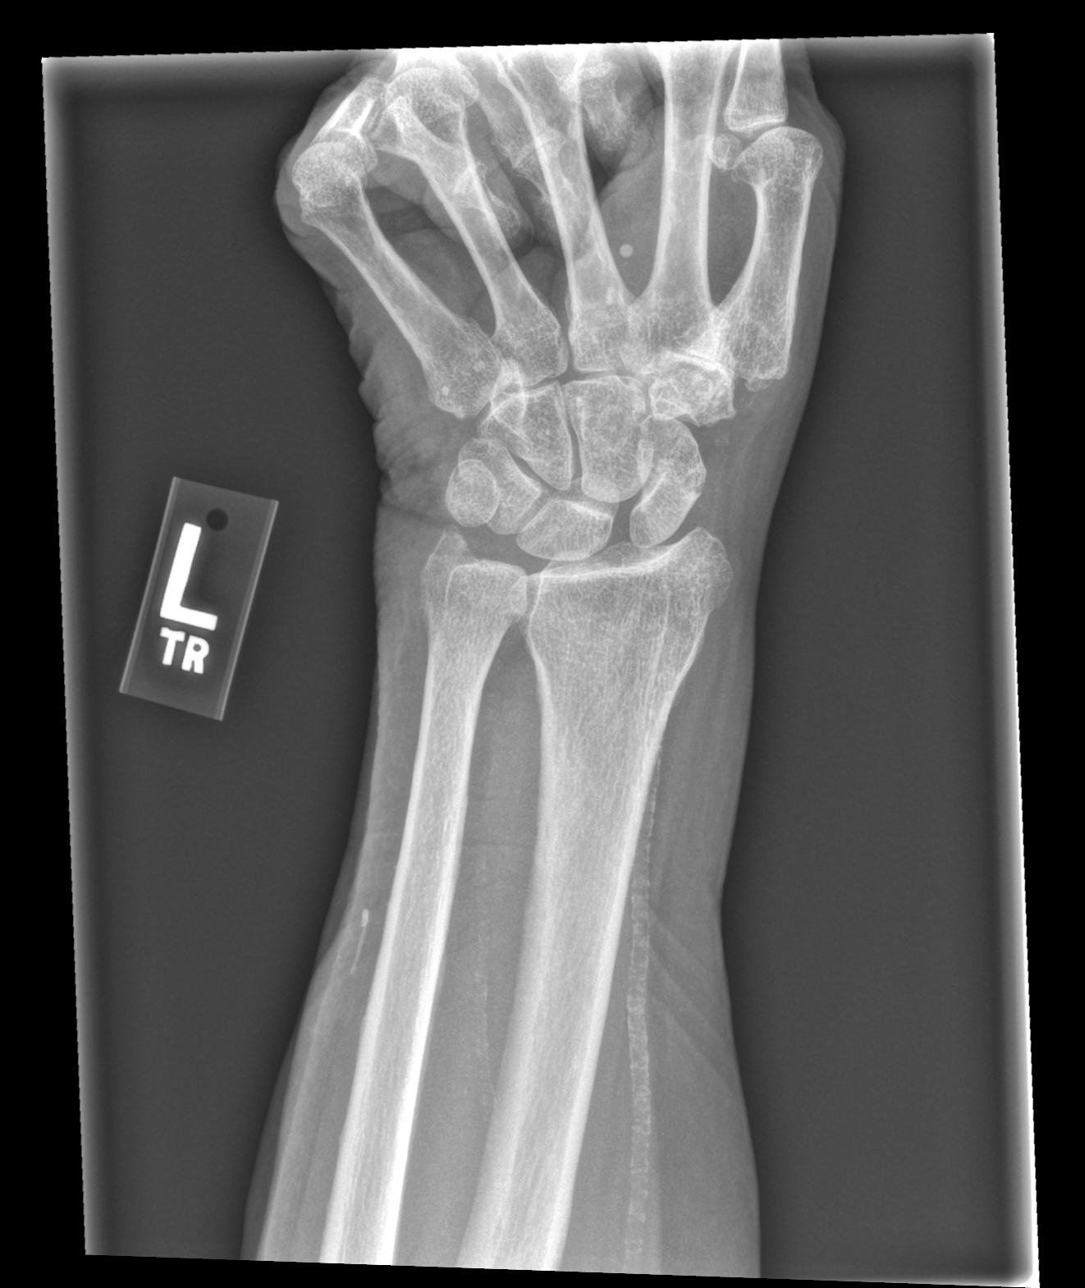

[llo]
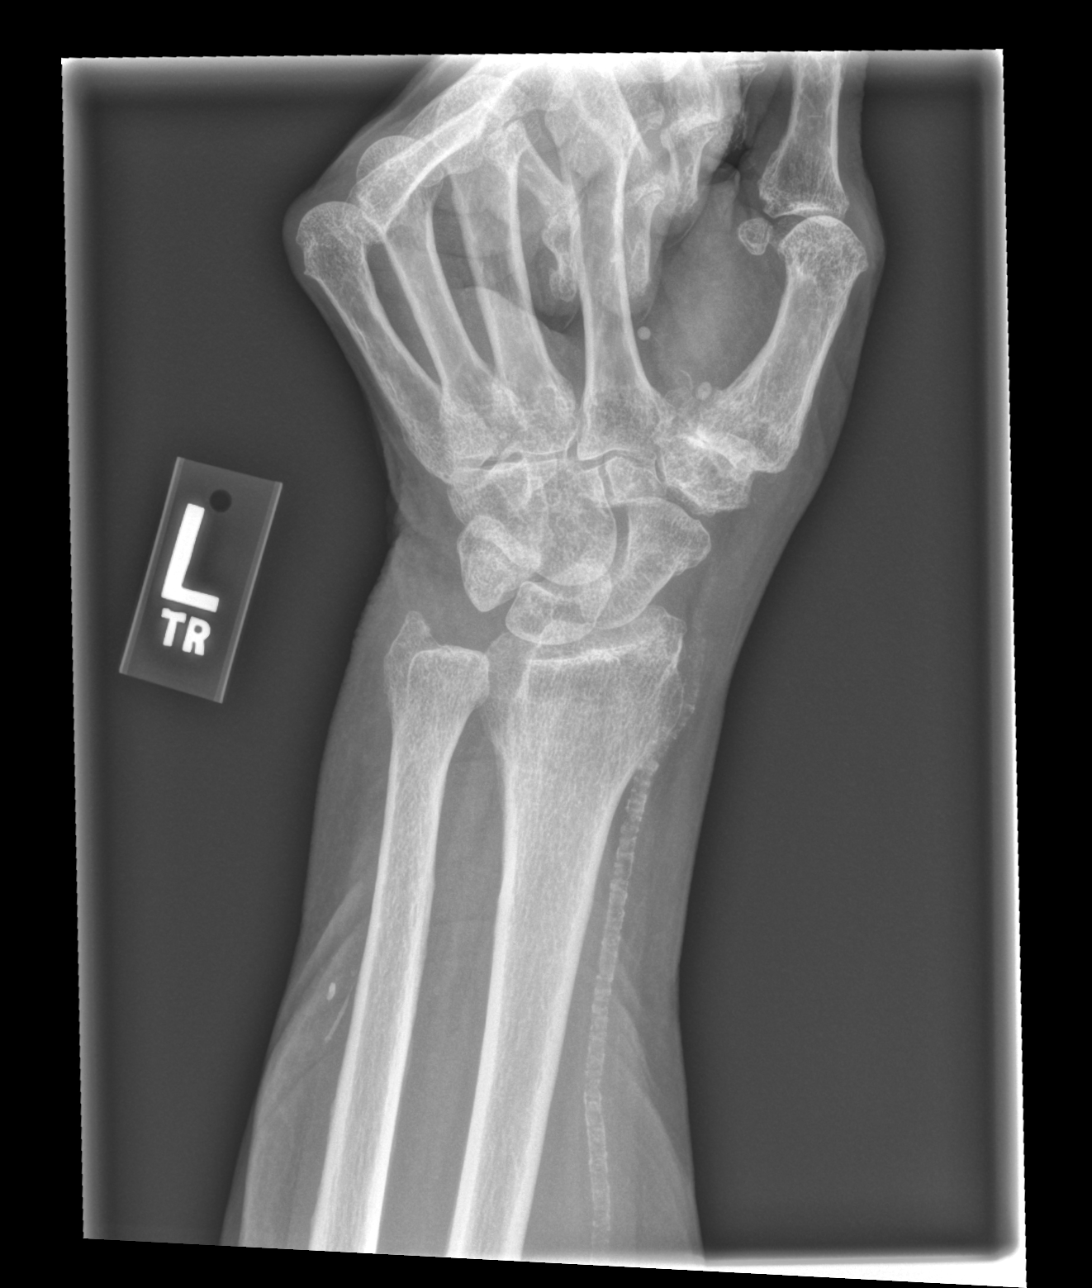

[left lateral]
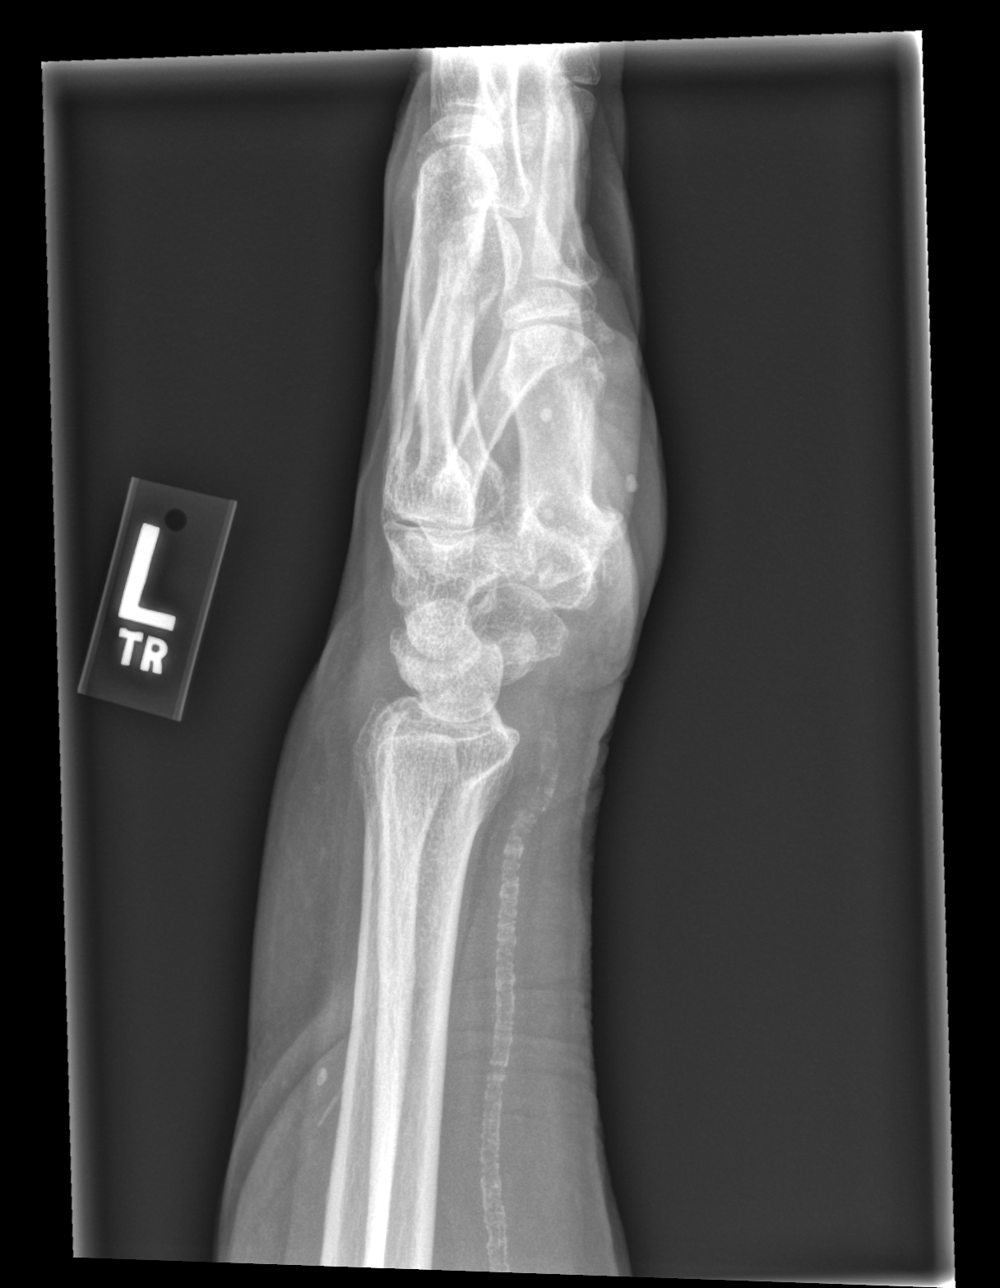

[PA (2 of 2)]
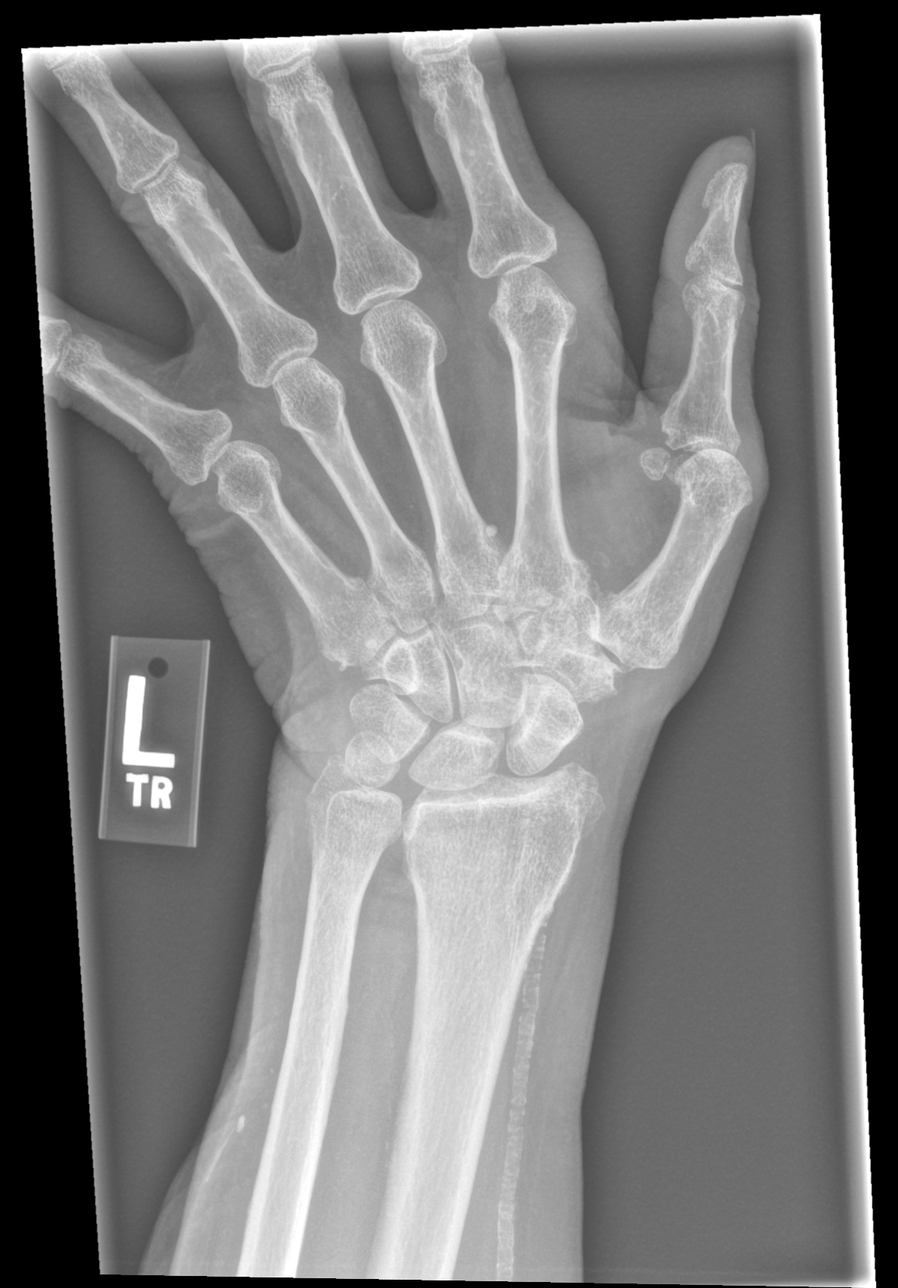

[4 of 4 positions shown; findings below may reference images not displayed]

FINDINGS: 4 views of the left wrist show normal alignment of the joints. There is joint space narrowing with mild articular sclerosis between the trapezium and the base of the first metacarpal. No
fracture seen. Vascular calcification is present.
IMPRESSION: Mild degenerative changes.

## 2022-08-16 IMAGING — CR HAND LT 3 VWS MIN
3 series · 3 of 3 positions shown · non-contrast
Comparison: None

Images Obtained from [facility]
REASON FOR EXAM: Pain

[PA]
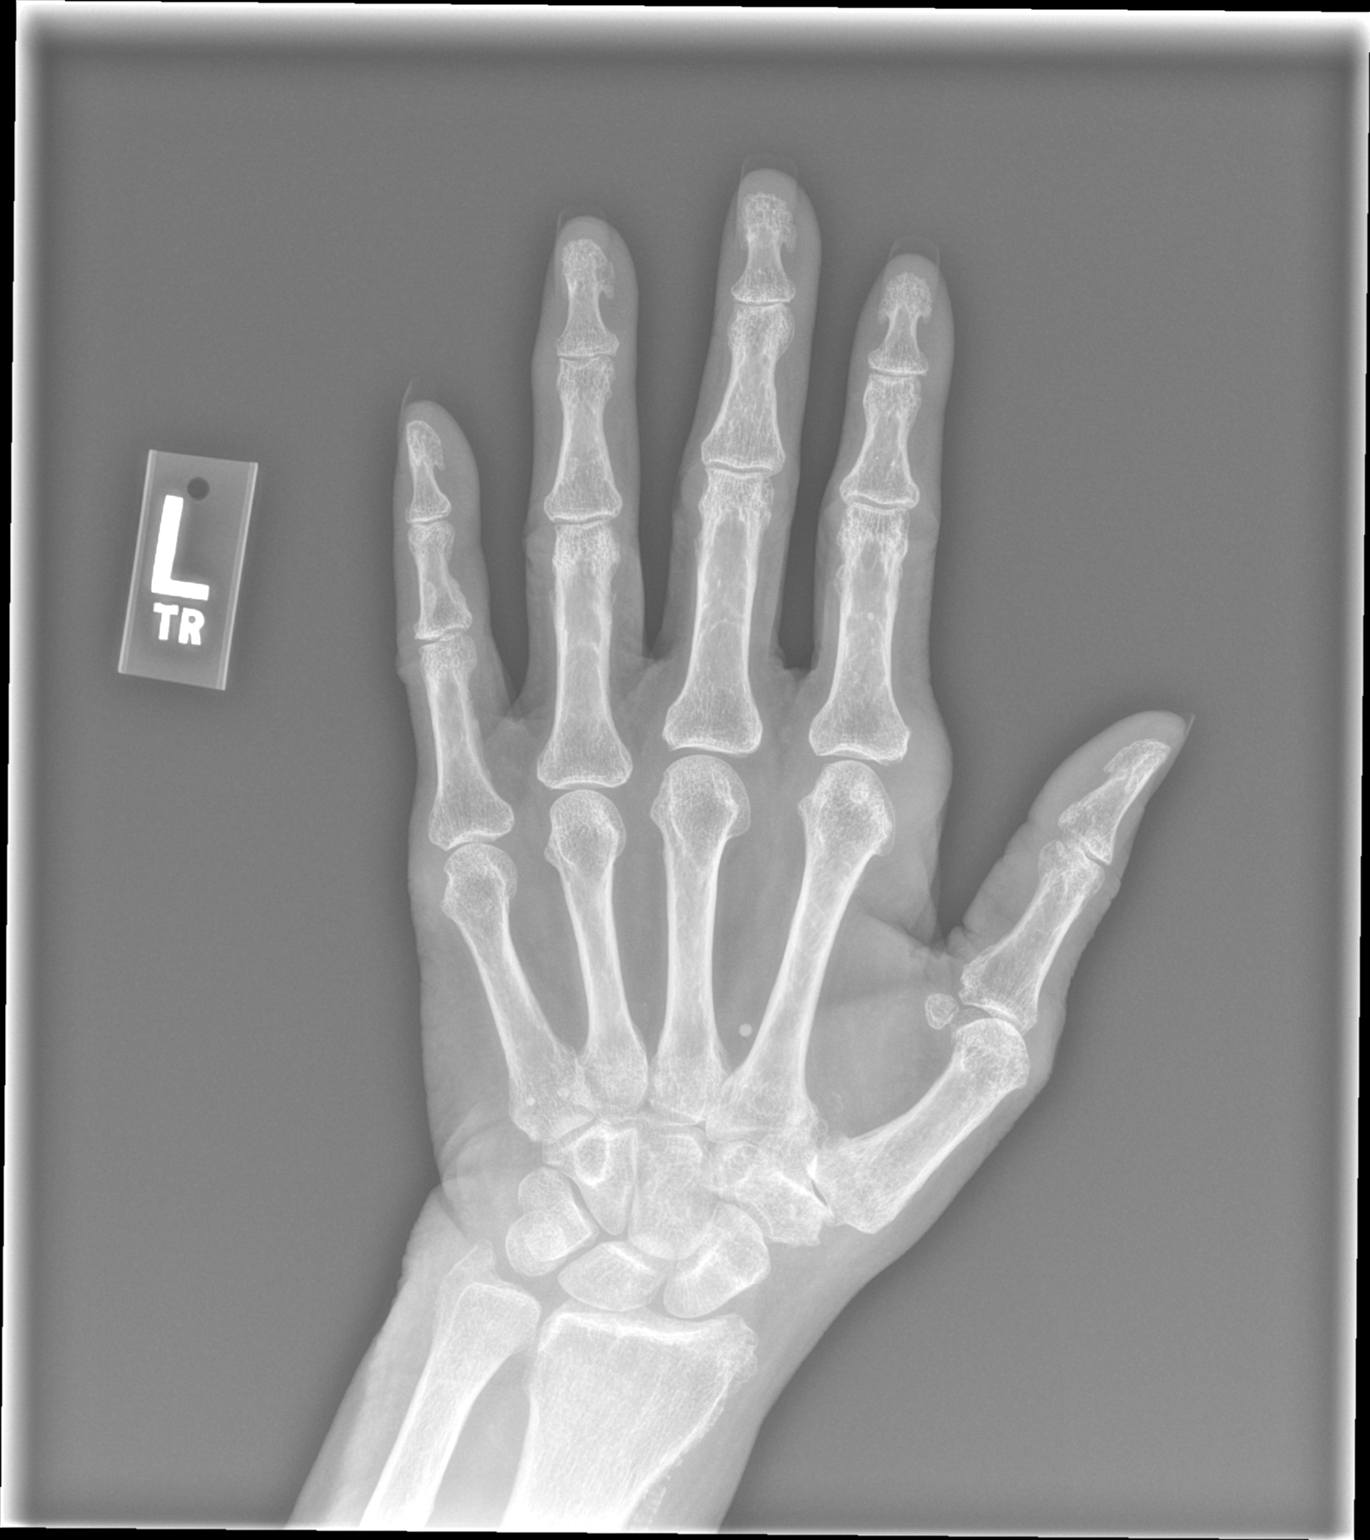

[llo]
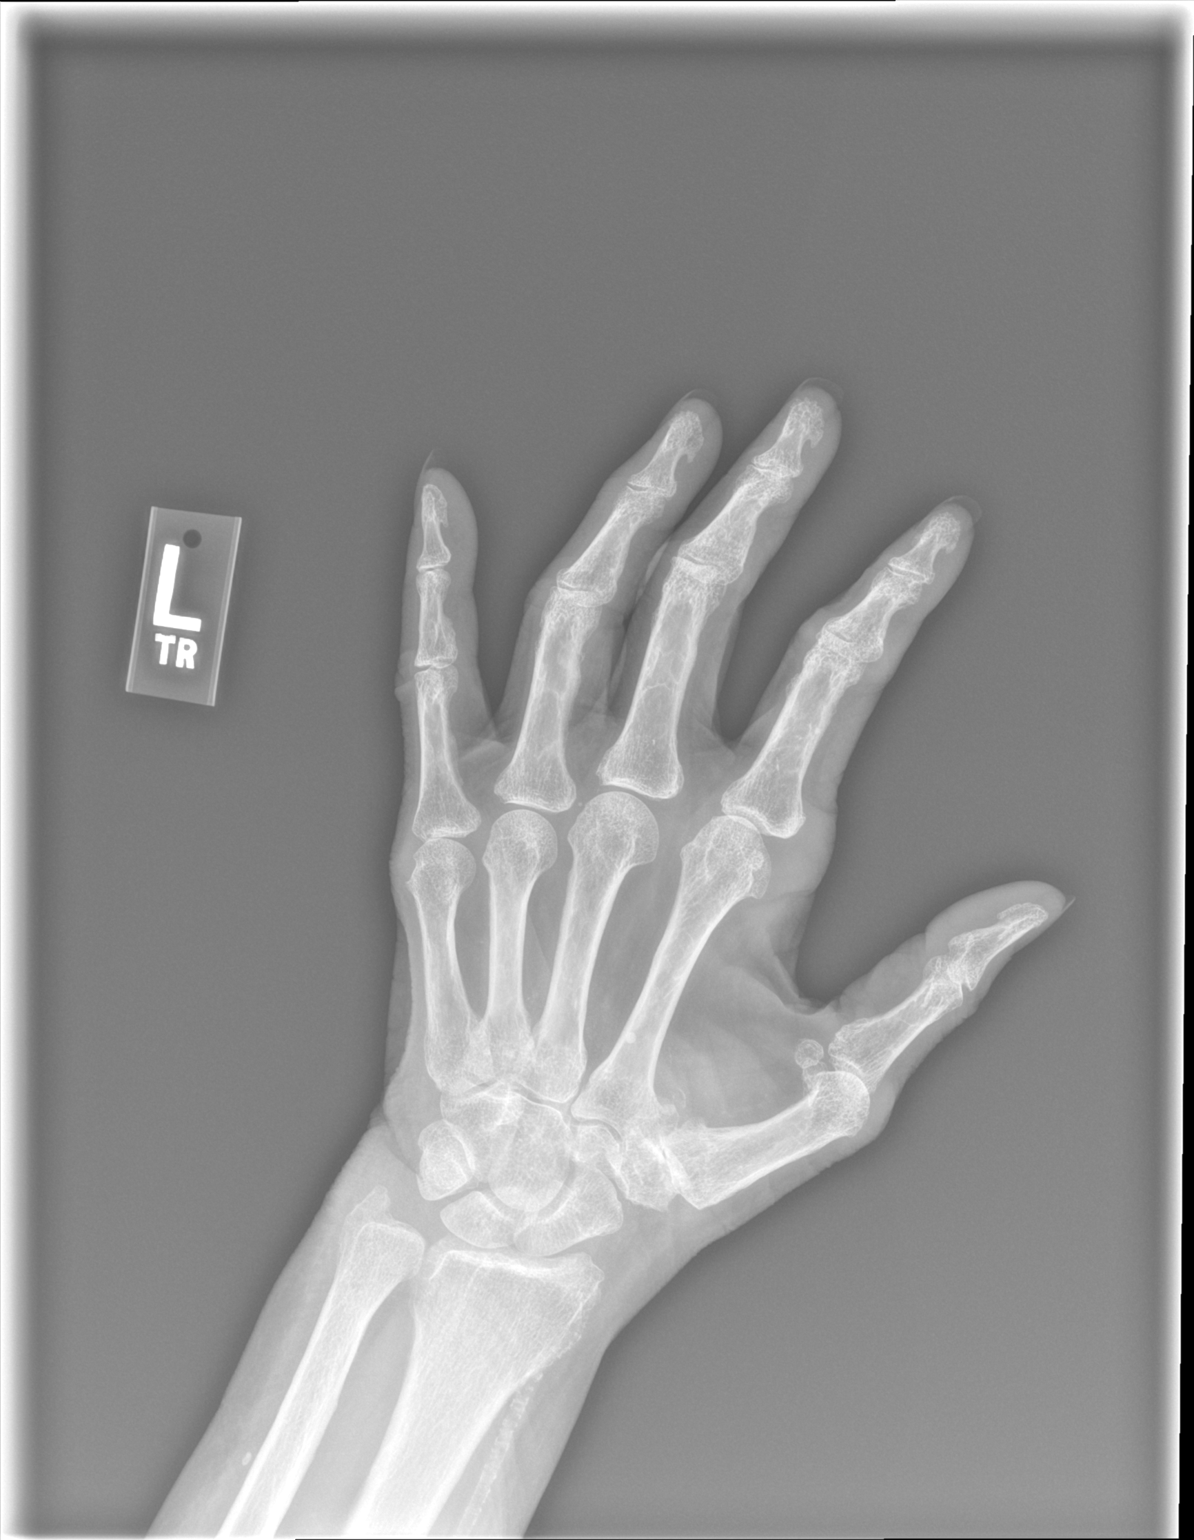

[left lateral]
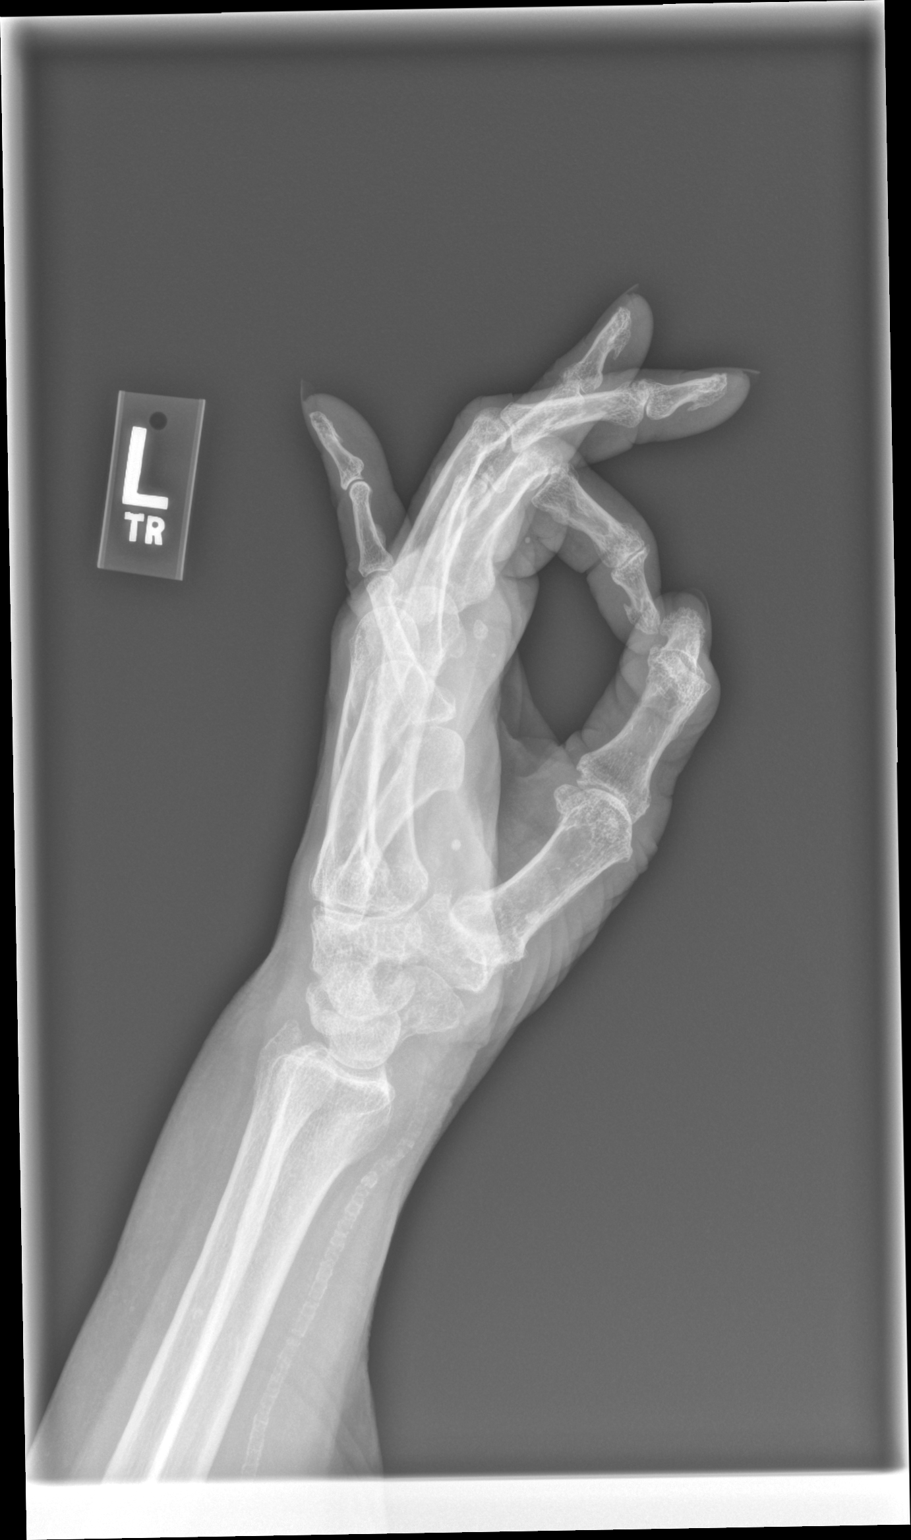

[3 of 3 positions shown; findings below may reference images not displayed]

FINDINGS: 3 views of the left hand show mild generalized osteopenia. The joints are in good alignment. There is no fracture identified. Mild degenerative changes are seen of the interphalangeal
joints. Vascular calcification is present.
IMPRESSION: Mild chronic and degenerative changes.

## 2022-10-17 DEATH — deceased
